# Patient Record
Sex: Male | Born: 1970 | Race: Black or African American | Hispanic: No | Smoking: Former smoker
Health system: Southern US, Community
[De-identification: ages and names within clinical notes are randomized; demographics above are authoritative.]

## PROBLEM LIST (undated history)

## (undated) DIAGNOSIS — M109 Gout, unspecified: Secondary | ICD-10-CM

## (undated) DIAGNOSIS — K219 Gastro-esophageal reflux disease without esophagitis: Secondary | ICD-10-CM

## (undated) DIAGNOSIS — F329 Major depressive disorder, single episode, unspecified: Secondary | ICD-10-CM

## (undated) DIAGNOSIS — I10 Essential (primary) hypertension: Secondary | ICD-10-CM

## (undated) DIAGNOSIS — F32A Depression, unspecified: Secondary | ICD-10-CM

## (undated) DIAGNOSIS — I509 Heart failure, unspecified: Secondary | ICD-10-CM

## (undated) DIAGNOSIS — J302 Other seasonal allergic rhinitis: Secondary | ICD-10-CM

## (undated) DIAGNOSIS — E785 Hyperlipidemia, unspecified: Secondary | ICD-10-CM

## (undated) DIAGNOSIS — E119 Type 2 diabetes mellitus without complications: Secondary | ICD-10-CM

## (undated) DIAGNOSIS — M1990 Unspecified osteoarthritis, unspecified site: Secondary | ICD-10-CM

---
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
## 2010-10-14 DIAGNOSIS — R079 Chest pain, unspecified: Secondary | ICD-10-CM

## 2013-02-08 DIAGNOSIS — R079 Chest pain, unspecified: Secondary | ICD-10-CM

## 2013-02-17 DIAGNOSIS — R079 Chest pain, unspecified: Secondary | ICD-10-CM

## 2015-07-22 DIAGNOSIS — K529 Noninfective gastroenteritis and colitis, unspecified: Secondary | ICD-10-CM | POA: Diagnosis not present

## 2015-07-22 DIAGNOSIS — R197 Diarrhea, unspecified: Secondary | ICD-10-CM | POA: Diagnosis not present

## 2015-07-22 DIAGNOSIS — K219 Gastro-esophageal reflux disease without esophagitis: Secondary | ICD-10-CM | POA: Diagnosis not present

## 2015-07-22 DIAGNOSIS — R509 Fever, unspecified: Secondary | ICD-10-CM | POA: Diagnosis not present

## 2015-07-22 DIAGNOSIS — Z79899 Other long term (current) drug therapy: Secondary | ICD-10-CM | POA: Diagnosis not present

## 2015-07-22 DIAGNOSIS — R0602 Shortness of breath: Secondary | ICD-10-CM | POA: Diagnosis not present

## 2015-07-22 DIAGNOSIS — Z7984 Long term (current) use of oral hypoglycemic drugs: Secondary | ICD-10-CM | POA: Diagnosis not present

## 2015-07-22 DIAGNOSIS — I10 Essential (primary) hypertension: Secondary | ICD-10-CM | POA: Diagnosis not present

## 2015-07-22 DIAGNOSIS — E119 Type 2 diabetes mellitus without complications: Secondary | ICD-10-CM | POA: Diagnosis not present

## 2015-08-24 DIAGNOSIS — K219 Gastro-esophageal reflux disease without esophagitis: Secondary | ICD-10-CM | POA: Diagnosis not present

## 2015-08-24 DIAGNOSIS — I10 Essential (primary) hypertension: Secondary | ICD-10-CM | POA: Diagnosis not present

## 2015-08-24 DIAGNOSIS — W260XXA Contact with knife, initial encounter: Secondary | ICD-10-CM | POA: Diagnosis not present

## 2015-08-24 DIAGNOSIS — Z79899 Other long term (current) drug therapy: Secondary | ICD-10-CM | POA: Diagnosis not present

## 2015-08-24 DIAGNOSIS — E119 Type 2 diabetes mellitus without complications: Secondary | ICD-10-CM | POA: Diagnosis not present

## 2015-08-24 DIAGNOSIS — S6991XA Unspecified injury of right wrist, hand and finger(s), initial encounter: Secondary | ICD-10-CM | POA: Diagnosis not present

## 2015-08-24 DIAGNOSIS — Z7984 Long term (current) use of oral hypoglycemic drugs: Secondary | ICD-10-CM | POA: Diagnosis not present

## 2015-08-24 DIAGNOSIS — Z87891 Personal history of nicotine dependence: Secondary | ICD-10-CM | POA: Diagnosis not present

## 2015-08-24 DIAGNOSIS — S61210A Laceration without foreign body of right index finger without damage to nail, initial encounter: Secondary | ICD-10-CM | POA: Diagnosis not present

## 2015-08-28 DIAGNOSIS — E1142 Type 2 diabetes mellitus with diabetic polyneuropathy: Secondary | ICD-10-CM | POA: Diagnosis not present

## 2015-08-28 DIAGNOSIS — M1049 Other secondary gout, multiple sites: Secondary | ICD-10-CM | POA: Diagnosis not present

## 2015-08-28 DIAGNOSIS — I10 Essential (primary) hypertension: Secondary | ICD-10-CM | POA: Diagnosis not present

## 2015-09-19 DIAGNOSIS — E1142 Type 2 diabetes mellitus with diabetic polyneuropathy: Secondary | ICD-10-CM | POA: Diagnosis not present

## 2015-10-09 DIAGNOSIS — G4733 Obstructive sleep apnea (adult) (pediatric): Secondary | ICD-10-CM | POA: Diagnosis not present

## 2016-02-04 DIAGNOSIS — G4733 Obstructive sleep apnea (adult) (pediatric): Secondary | ICD-10-CM | POA: Diagnosis not present

## 2016-02-04 DIAGNOSIS — Z6841 Body Mass Index (BMI) 40.0 and over, adult: Secondary | ICD-10-CM | POA: Diagnosis not present

## 2016-02-04 DIAGNOSIS — E1142 Type 2 diabetes mellitus with diabetic polyneuropathy: Secondary | ICD-10-CM | POA: Diagnosis not present

## 2016-02-04 DIAGNOSIS — I10 Essential (primary) hypertension: Secondary | ICD-10-CM | POA: Diagnosis not present

## 2016-02-04 DIAGNOSIS — M1049 Other secondary gout, multiple sites: Secondary | ICD-10-CM | POA: Diagnosis not present

## 2016-03-23 DIAGNOSIS — M545 Low back pain: Secondary | ICD-10-CM | POA: Diagnosis not present

## 2016-03-23 DIAGNOSIS — G8929 Other chronic pain: Secondary | ICD-10-CM | POA: Diagnosis not present

## 2016-03-23 DIAGNOSIS — E119 Type 2 diabetes mellitus without complications: Secondary | ICD-10-CM | POA: Diagnosis not present

## 2016-03-23 DIAGNOSIS — M109 Gout, unspecified: Secondary | ICD-10-CM | POA: Diagnosis not present

## 2016-03-23 DIAGNOSIS — G4730 Sleep apnea, unspecified: Secondary | ICD-10-CM | POA: Diagnosis not present

## 2016-03-23 DIAGNOSIS — I509 Heart failure, unspecified: Secondary | ICD-10-CM | POA: Diagnosis not present

## 2016-04-18 DIAGNOSIS — Z0100 Encounter for examination of eyes and vision without abnormal findings: Secondary | ICD-10-CM | POA: Diagnosis not present

## 2016-04-18 DIAGNOSIS — E109 Type 1 diabetes mellitus without complications: Secondary | ICD-10-CM | POA: Diagnosis not present

## 2016-05-09 DIAGNOSIS — E1142 Type 2 diabetes mellitus with diabetic polyneuropathy: Secondary | ICD-10-CM | POA: Diagnosis not present

## 2016-05-09 DIAGNOSIS — I10 Essential (primary) hypertension: Secondary | ICD-10-CM | POA: Diagnosis not present

## 2016-05-09 DIAGNOSIS — G4733 Obstructive sleep apnea (adult) (pediatric): Secondary | ICD-10-CM | POA: Diagnosis not present

## 2016-05-09 DIAGNOSIS — M1049 Other secondary gout, multiple sites: Secondary | ICD-10-CM | POA: Diagnosis not present

## 2016-05-09 DIAGNOSIS — Z6841 Body Mass Index (BMI) 40.0 and over, adult: Secondary | ICD-10-CM | POA: Diagnosis not present

## 2016-06-13 DIAGNOSIS — E785 Hyperlipidemia, unspecified: Secondary | ICD-10-CM | POA: Diagnosis not present

## 2016-06-13 DIAGNOSIS — G4733 Obstructive sleep apnea (adult) (pediatric): Secondary | ICD-10-CM | POA: Diagnosis not present

## 2016-06-13 DIAGNOSIS — R5383 Other fatigue: Secondary | ICD-10-CM | POA: Diagnosis not present

## 2016-06-13 DIAGNOSIS — I10 Essential (primary) hypertension: Secondary | ICD-10-CM | POA: Diagnosis not present

## 2016-06-13 DIAGNOSIS — E1140 Type 2 diabetes mellitus with diabetic neuropathy, unspecified: Secondary | ICD-10-CM | POA: Diagnosis not present

## 2016-06-13 DIAGNOSIS — G4709 Other insomnia: Secondary | ICD-10-CM | POA: Diagnosis not present

## 2016-06-26 DIAGNOSIS — Z888 Allergy status to other drugs, medicaments and biological substances status: Secondary | ICD-10-CM | POA: Diagnosis not present

## 2016-06-26 DIAGNOSIS — Z79899 Other long term (current) drug therapy: Secondary | ICD-10-CM | POA: Diagnosis not present

## 2016-06-26 DIAGNOSIS — Z7722 Contact with and (suspected) exposure to environmental tobacco smoke (acute) (chronic): Secondary | ICD-10-CM | POA: Diagnosis not present

## 2016-06-26 DIAGNOSIS — R0789 Other chest pain: Secondary | ICD-10-CM | POA: Diagnosis not present

## 2016-06-26 DIAGNOSIS — R079 Chest pain, unspecified: Secondary | ICD-10-CM | POA: Diagnosis not present

## 2016-06-26 DIAGNOSIS — R0602 Shortness of breath: Secondary | ICD-10-CM | POA: Diagnosis not present

## 2016-06-26 DIAGNOSIS — Z7984 Long term (current) use of oral hypoglycemic drugs: Secondary | ICD-10-CM | POA: Diagnosis not present

## 2016-06-26 DIAGNOSIS — I10 Essential (primary) hypertension: Secondary | ICD-10-CM | POA: Diagnosis not present

## 2016-06-27 DIAGNOSIS — I10 Essential (primary) hypertension: Secondary | ICD-10-CM | POA: Diagnosis not present

## 2016-06-27 DIAGNOSIS — Z888 Allergy status to other drugs, medicaments and biological substances status: Secondary | ICD-10-CM | POA: Diagnosis not present

## 2016-06-27 DIAGNOSIS — R0602 Shortness of breath: Secondary | ICD-10-CM | POA: Diagnosis not present

## 2016-06-27 DIAGNOSIS — R0789 Other chest pain: Secondary | ICD-10-CM | POA: Diagnosis not present

## 2016-06-27 DIAGNOSIS — Z7722 Contact with and (suspected) exposure to environmental tobacco smoke (acute) (chronic): Secondary | ICD-10-CM | POA: Diagnosis not present

## 2016-06-28 DIAGNOSIS — R0602 Shortness of breath: Secondary | ICD-10-CM | POA: Diagnosis not present

## 2016-06-28 DIAGNOSIS — Z7722 Contact with and (suspected) exposure to environmental tobacco smoke (acute) (chronic): Secondary | ICD-10-CM | POA: Diagnosis not present

## 2016-06-28 DIAGNOSIS — R0789 Other chest pain: Secondary | ICD-10-CM | POA: Diagnosis not present

## 2016-06-28 DIAGNOSIS — I10 Essential (primary) hypertension: Secondary | ICD-10-CM | POA: Diagnosis not present

## 2016-06-28 DIAGNOSIS — Z888 Allergy status to other drugs, medicaments and biological substances status: Secondary | ICD-10-CM | POA: Diagnosis not present

## 2016-06-30 DIAGNOSIS — M1990 Unspecified osteoarthritis, unspecified site: Secondary | ICD-10-CM | POA: Diagnosis not present

## 2016-06-30 DIAGNOSIS — M791 Myalgia: Secondary | ICD-10-CM | POA: Diagnosis not present

## 2016-06-30 DIAGNOSIS — I10 Essential (primary) hypertension: Secondary | ICD-10-CM | POA: Diagnosis not present

## 2016-06-30 DIAGNOSIS — R6883 Chills (without fever): Secondary | ICD-10-CM | POA: Diagnosis not present

## 2016-06-30 DIAGNOSIS — R51 Headache: Secondary | ICD-10-CM | POA: Diagnosis not present

## 2016-06-30 DIAGNOSIS — E119 Type 2 diabetes mellitus without complications: Secondary | ICD-10-CM | POA: Diagnosis not present

## 2016-06-30 DIAGNOSIS — J029 Acute pharyngitis, unspecified: Secondary | ICD-10-CM | POA: Diagnosis not present

## 2016-06-30 DIAGNOSIS — K219 Gastro-esophageal reflux disease without esophagitis: Secondary | ICD-10-CM | POA: Diagnosis not present

## 2016-07-25 DIAGNOSIS — G4733 Obstructive sleep apnea (adult) (pediatric): Secondary | ICD-10-CM | POA: Diagnosis not present

## 2016-10-20 DIAGNOSIS — G4733 Obstructive sleep apnea (adult) (pediatric): Secondary | ICD-10-CM | POA: Diagnosis not present

## 2016-11-20 DIAGNOSIS — G4733 Obstructive sleep apnea (adult) (pediatric): Secondary | ICD-10-CM | POA: Diagnosis not present

## 2016-12-11 DIAGNOSIS — Z6841 Body Mass Index (BMI) 40.0 and over, adult: Secondary | ICD-10-CM | POA: Diagnosis not present

## 2016-12-11 DIAGNOSIS — Z0000 Encounter for general adult medical examination without abnormal findings: Secondary | ICD-10-CM | POA: Diagnosis not present

## 2016-12-11 DIAGNOSIS — E1142 Type 2 diabetes mellitus with diabetic polyneuropathy: Secondary | ICD-10-CM | POA: Diagnosis not present

## 2016-12-11 DIAGNOSIS — I10 Essential (primary) hypertension: Secondary | ICD-10-CM | POA: Diagnosis not present

## 2016-12-11 DIAGNOSIS — M1049 Other secondary gout, multiple sites: Secondary | ICD-10-CM | POA: Diagnosis not present

## 2016-12-11 DIAGNOSIS — G4733 Obstructive sleep apnea (adult) (pediatric): Secondary | ICD-10-CM | POA: Diagnosis not present

## 2016-12-11 DIAGNOSIS — Z1389 Encounter for screening for other disorder: Secondary | ICD-10-CM | POA: Diagnosis not present

## 2016-12-20 DIAGNOSIS — G4733 Obstructive sleep apnea (adult) (pediatric): Secondary | ICD-10-CM | POA: Diagnosis not present

## 2017-01-11 DIAGNOSIS — I110 Hypertensive heart disease with heart failure: Secondary | ICD-10-CM | POA: Diagnosis not present

## 2017-01-11 DIAGNOSIS — Z7982 Long term (current) use of aspirin: Secondary | ICD-10-CM | POA: Diagnosis not present

## 2017-01-11 DIAGNOSIS — Z794 Long term (current) use of insulin: Secondary | ICD-10-CM | POA: Diagnosis not present

## 2017-01-11 DIAGNOSIS — Z87891 Personal history of nicotine dependence: Secondary | ICD-10-CM | POA: Diagnosis not present

## 2017-01-11 DIAGNOSIS — R609 Edema, unspecified: Secondary | ICD-10-CM | POA: Diagnosis not present

## 2017-01-11 DIAGNOSIS — Z79899 Other long term (current) drug therapy: Secondary | ICD-10-CM | POA: Diagnosis not present

## 2017-01-11 DIAGNOSIS — R600 Localized edema: Secondary | ICD-10-CM | POA: Diagnosis not present

## 2017-01-11 DIAGNOSIS — I509 Heart failure, unspecified: Secondary | ICD-10-CM | POA: Diagnosis not present

## 2017-01-11 DIAGNOSIS — E119 Type 2 diabetes mellitus without complications: Secondary | ICD-10-CM | POA: Diagnosis not present

## 2017-01-20 DIAGNOSIS — G4733 Obstructive sleep apnea (adult) (pediatric): Secondary | ICD-10-CM | POA: Diagnosis not present

## 2017-08-02 DIAGNOSIS — Z87891 Personal history of nicotine dependence: Secondary | ICD-10-CM | POA: Diagnosis not present

## 2017-08-02 DIAGNOSIS — I110 Hypertensive heart disease with heart failure: Secondary | ICD-10-CM | POA: Diagnosis not present

## 2017-08-02 DIAGNOSIS — M109 Gout, unspecified: Secondary | ICD-10-CM | POA: Diagnosis not present

## 2017-08-02 DIAGNOSIS — I509 Heart failure, unspecified: Secondary | ICD-10-CM | POA: Diagnosis not present

## 2017-08-02 DIAGNOSIS — Z7984 Long term (current) use of oral hypoglycemic drugs: Secondary | ICD-10-CM | POA: Diagnosis not present

## 2017-08-02 DIAGNOSIS — R1030 Lower abdominal pain, unspecified: Secondary | ICD-10-CM | POA: Diagnosis not present

## 2017-08-02 DIAGNOSIS — Z7982 Long term (current) use of aspirin: Secondary | ICD-10-CM | POA: Diagnosis not present

## 2017-08-02 DIAGNOSIS — K219 Gastro-esophageal reflux disease without esophagitis: Secondary | ICD-10-CM | POA: Diagnosis not present

## 2017-08-02 DIAGNOSIS — Z79899 Other long term (current) drug therapy: Secondary | ICD-10-CM | POA: Diagnosis not present

## 2017-08-02 DIAGNOSIS — E119 Type 2 diabetes mellitus without complications: Secondary | ICD-10-CM | POA: Diagnosis not present

## 2017-09-24 DIAGNOSIS — Z8249 Family history of ischemic heart disease and other diseases of the circulatory system: Secondary | ICD-10-CM | POA: Diagnosis not present

## 2017-09-24 DIAGNOSIS — J302 Other seasonal allergic rhinitis: Secondary | ICD-10-CM | POA: Diagnosis not present

## 2017-09-24 DIAGNOSIS — E119 Type 2 diabetes mellitus without complications: Secondary | ICD-10-CM | POA: Diagnosis not present

## 2017-09-24 DIAGNOSIS — Z87891 Personal history of nicotine dependence: Secondary | ICD-10-CM | POA: Diagnosis not present

## 2017-09-24 DIAGNOSIS — Z79899 Other long term (current) drug therapy: Secondary | ICD-10-CM | POA: Diagnosis not present

## 2017-09-24 DIAGNOSIS — M1990 Unspecified osteoarthritis, unspecified site: Secondary | ICD-10-CM | POA: Diagnosis not present

## 2017-09-24 DIAGNOSIS — I509 Heart failure, unspecified: Secondary | ICD-10-CM | POA: Diagnosis not present

## 2017-09-24 DIAGNOSIS — I110 Hypertensive heart disease with heart failure: Secondary | ICD-10-CM | POA: Diagnosis not present

## 2017-09-24 DIAGNOSIS — Z7984 Long term (current) use of oral hypoglycemic drugs: Secondary | ICD-10-CM | POA: Diagnosis not present

## 2017-09-24 DIAGNOSIS — K219 Gastro-esophageal reflux disease without esophagitis: Secondary | ICD-10-CM | POA: Diagnosis not present

## 2017-09-24 DIAGNOSIS — I10 Essential (primary) hypertension: Secondary | ICD-10-CM | POA: Diagnosis not present

## 2017-09-24 DIAGNOSIS — Z7982 Long term (current) use of aspirin: Secondary | ICD-10-CM | POA: Diagnosis not present

## 2017-10-19 DIAGNOSIS — E1142 Type 2 diabetes mellitus with diabetic polyneuropathy: Secondary | ICD-10-CM | POA: Diagnosis not present

## 2017-10-19 DIAGNOSIS — M1049 Other secondary gout, multiple sites: Secondary | ICD-10-CM | POA: Diagnosis not present

## 2017-10-19 DIAGNOSIS — G4733 Obstructive sleep apnea (adult) (pediatric): Secondary | ICD-10-CM | POA: Diagnosis not present

## 2017-10-19 DIAGNOSIS — I10 Essential (primary) hypertension: Secondary | ICD-10-CM | POA: Diagnosis not present

## 2017-10-20 DIAGNOSIS — E119 Type 2 diabetes mellitus without complications: Secondary | ICD-10-CM | POA: Diagnosis not present

## 2017-10-20 DIAGNOSIS — R0602 Shortness of breath: Secondary | ICD-10-CM | POA: Diagnosis not present

## 2018-01-19 DIAGNOSIS — I10 Essential (primary) hypertension: Secondary | ICD-10-CM | POA: Diagnosis not present

## 2018-01-19 DIAGNOSIS — Z0000 Encounter for general adult medical examination without abnormal findings: Secondary | ICD-10-CM | POA: Diagnosis not present

## 2018-01-19 DIAGNOSIS — E1142 Type 2 diabetes mellitus with diabetic polyneuropathy: Secondary | ICD-10-CM | POA: Diagnosis not present

## 2018-01-19 DIAGNOSIS — G4733 Obstructive sleep apnea (adult) (pediatric): Secondary | ICD-10-CM | POA: Diagnosis not present

## 2018-01-19 DIAGNOSIS — M1049 Other secondary gout, multiple sites: Secondary | ICD-10-CM | POA: Diagnosis not present

## 2018-03-28 DIAGNOSIS — K219 Gastro-esophageal reflux disease without esophagitis: Secondary | ICD-10-CM | POA: Diagnosis not present

## 2018-03-28 DIAGNOSIS — Z7984 Long term (current) use of oral hypoglycemic drugs: Secondary | ICD-10-CM | POA: Diagnosis not present

## 2018-03-28 DIAGNOSIS — Z8249 Family history of ischemic heart disease and other diseases of the circulatory system: Secondary | ICD-10-CM | POA: Diagnosis not present

## 2018-03-28 DIAGNOSIS — Z833 Family history of diabetes mellitus: Secondary | ICD-10-CM | POA: Diagnosis not present

## 2018-03-28 DIAGNOSIS — I509 Heart failure, unspecified: Secondary | ICD-10-CM | POA: Diagnosis not present

## 2018-03-28 DIAGNOSIS — M1712 Unilateral primary osteoarthritis, left knee: Secondary | ICD-10-CM | POA: Diagnosis not present

## 2018-03-28 DIAGNOSIS — Z8639 Personal history of other endocrine, nutritional and metabolic disease: Secondary | ICD-10-CM | POA: Diagnosis not present

## 2018-03-28 DIAGNOSIS — I110 Hypertensive heart disease with heart failure: Secondary | ICD-10-CM | POA: Diagnosis not present

## 2018-03-28 DIAGNOSIS — M25562 Pain in left knee: Secondary | ICD-10-CM | POA: Diagnosis not present

## 2018-03-28 DIAGNOSIS — I10 Essential (primary) hypertension: Secondary | ICD-10-CM | POA: Diagnosis not present

## 2018-03-28 DIAGNOSIS — E119 Type 2 diabetes mellitus without complications: Secondary | ICD-10-CM | POA: Diagnosis not present

## 2018-03-28 DIAGNOSIS — Z87891 Personal history of nicotine dependence: Secondary | ICD-10-CM | POA: Diagnosis not present

## 2018-03-28 DIAGNOSIS — S8992XA Unspecified injury of left lower leg, initial encounter: Secondary | ICD-10-CM | POA: Diagnosis not present

## 2018-03-28 DIAGNOSIS — Z79899 Other long term (current) drug therapy: Secondary | ICD-10-CM | POA: Diagnosis not present

## 2018-03-28 DIAGNOSIS — Z8614 Personal history of Methicillin resistant Staphylococcus aureus infection: Secondary | ICD-10-CM | POA: Diagnosis not present

## 2018-04-20 DIAGNOSIS — Z9114 Patient's other noncompliance with medication regimen: Secondary | ICD-10-CM | POA: Diagnosis not present

## 2018-04-20 DIAGNOSIS — Z7951 Long term (current) use of inhaled steroids: Secondary | ICD-10-CM | POA: Diagnosis not present

## 2018-04-20 DIAGNOSIS — I110 Hypertensive heart disease with heart failure: Secondary | ICD-10-CM | POA: Diagnosis not present

## 2018-04-20 DIAGNOSIS — E785 Hyperlipidemia, unspecified: Secondary | ICD-10-CM | POA: Diagnosis not present

## 2018-04-20 DIAGNOSIS — Z833 Family history of diabetes mellitus: Secondary | ICD-10-CM | POA: Diagnosis not present

## 2018-04-20 DIAGNOSIS — K219 Gastro-esophageal reflux disease without esophagitis: Secondary | ICD-10-CM | POA: Diagnosis not present

## 2018-04-20 DIAGNOSIS — E119 Type 2 diabetes mellitus without complications: Secondary | ICD-10-CM | POA: Diagnosis not present

## 2018-04-20 DIAGNOSIS — M1990 Unspecified osteoarthritis, unspecified site: Secondary | ICD-10-CM | POA: Diagnosis not present

## 2018-04-20 DIAGNOSIS — G4489 Other headache syndrome: Secondary | ICD-10-CM | POA: Diagnosis not present

## 2018-04-20 DIAGNOSIS — Z79899 Other long term (current) drug therapy: Secondary | ICD-10-CM | POA: Diagnosis not present

## 2018-04-20 DIAGNOSIS — Z7984 Long term (current) use of oral hypoglycemic drugs: Secondary | ICD-10-CM | POA: Diagnosis not present

## 2018-04-20 DIAGNOSIS — I509 Heart failure, unspecified: Secondary | ICD-10-CM | POA: Diagnosis not present

## 2018-04-20 DIAGNOSIS — Z888 Allergy status to other drugs, medicaments and biological substances status: Secondary | ICD-10-CM | POA: Diagnosis not present

## 2018-04-20 DIAGNOSIS — R42 Dizziness and giddiness: Secondary | ICD-10-CM | POA: Diagnosis not present

## 2018-04-20 DIAGNOSIS — R55 Syncope and collapse: Secondary | ICD-10-CM | POA: Diagnosis not present

## 2018-04-20 DIAGNOSIS — R0902 Hypoxemia: Secondary | ICD-10-CM | POA: Diagnosis not present

## 2018-04-20 DIAGNOSIS — M109 Gout, unspecified: Secondary | ICD-10-CM | POA: Diagnosis not present

## 2018-04-20 DIAGNOSIS — R531 Weakness: Secondary | ICD-10-CM | POA: Diagnosis not present

## 2018-04-20 DIAGNOSIS — G4730 Sleep apnea, unspecified: Secondary | ICD-10-CM | POA: Diagnosis not present

## 2018-04-20 DIAGNOSIS — Z823 Family history of stroke: Secondary | ICD-10-CM | POA: Diagnosis not present

## 2018-04-20 DIAGNOSIS — Z8249 Family history of ischemic heart disease and other diseases of the circulatory system: Secondary | ICD-10-CM | POA: Diagnosis not present

## 2018-04-20 DIAGNOSIS — I10 Essential (primary) hypertension: Secondary | ICD-10-CM | POA: Diagnosis not present

## 2018-04-21 DIAGNOSIS — M109 Gout, unspecified: Secondary | ICD-10-CM | POA: Diagnosis not present

## 2018-04-21 DIAGNOSIS — I110 Hypertensive heart disease with heart failure: Secondary | ICD-10-CM | POA: Diagnosis not present

## 2018-04-21 DIAGNOSIS — R55 Syncope and collapse: Secondary | ICD-10-CM | POA: Diagnosis not present

## 2018-04-21 DIAGNOSIS — R079 Chest pain, unspecified: Secondary | ICD-10-CM | POA: Diagnosis not present

## 2018-04-21 DIAGNOSIS — I509 Heart failure, unspecified: Secondary | ICD-10-CM | POA: Diagnosis not present

## 2018-04-21 DIAGNOSIS — E119 Type 2 diabetes mellitus without complications: Secondary | ICD-10-CM | POA: Diagnosis not present

## 2018-04-28 DIAGNOSIS — G4733 Obstructive sleep apnea (adult) (pediatric): Secondary | ICD-10-CM | POA: Diagnosis not present

## 2018-04-28 DIAGNOSIS — R55 Syncope and collapse: Secondary | ICD-10-CM | POA: Diagnosis not present

## 2018-04-28 DIAGNOSIS — I10 Essential (primary) hypertension: Secondary | ICD-10-CM | POA: Diagnosis not present

## 2018-05-05 DIAGNOSIS — R079 Chest pain, unspecified: Secondary | ICD-10-CM | POA: Diagnosis not present

## 2018-05-13 DIAGNOSIS — I509 Heart failure, unspecified: Secondary | ICD-10-CM | POA: Diagnosis not present

## 2018-05-13 DIAGNOSIS — E119 Type 2 diabetes mellitus without complications: Secondary | ICD-10-CM | POA: Diagnosis not present

## 2018-05-13 DIAGNOSIS — M109 Gout, unspecified: Secondary | ICD-10-CM | POA: Diagnosis not present

## 2018-05-13 DIAGNOSIS — J069 Acute upper respiratory infection, unspecified: Secondary | ICD-10-CM | POA: Diagnosis not present

## 2018-05-13 DIAGNOSIS — Z8614 Personal history of Methicillin resistant Staphylococcus aureus infection: Secondary | ICD-10-CM | POA: Diagnosis not present

## 2018-05-13 DIAGNOSIS — Z87891 Personal history of nicotine dependence: Secondary | ICD-10-CM | POA: Diagnosis not present

## 2018-05-13 DIAGNOSIS — K219 Gastro-esophageal reflux disease without esophagitis: Secondary | ICD-10-CM | POA: Diagnosis not present

## 2018-05-13 DIAGNOSIS — Z79899 Other long term (current) drug therapy: Secondary | ICD-10-CM | POA: Diagnosis not present

## 2018-05-13 DIAGNOSIS — Z7984 Long term (current) use of oral hypoglycemic drugs: Secondary | ICD-10-CM | POA: Diagnosis not present

## 2018-05-13 DIAGNOSIS — J209 Acute bronchitis, unspecified: Secondary | ICD-10-CM | POA: Diagnosis not present

## 2018-05-13 DIAGNOSIS — I110 Hypertensive heart disease with heart failure: Secondary | ICD-10-CM | POA: Diagnosis not present

## 2018-05-13 DIAGNOSIS — Z888 Allergy status to other drugs, medicaments and biological substances status: Secondary | ICD-10-CM | POA: Diagnosis not present

## 2018-05-13 DIAGNOSIS — R0989 Other specified symptoms and signs involving the circulatory and respiratory systems: Secondary | ICD-10-CM | POA: Diagnosis not present

## 2018-05-20 DIAGNOSIS — R079 Chest pain, unspecified: Secondary | ICD-10-CM | POA: Diagnosis not present

## 2018-05-21 ENCOUNTER — Other Ambulatory Visit: Payer: Self-pay

## 2018-05-21 DIAGNOSIS — R079 Chest pain, unspecified: Secondary | ICD-10-CM | POA: Diagnosis not present

## 2018-05-21 DIAGNOSIS — I480 Paroxysmal atrial fibrillation: Secondary | ICD-10-CM | POA: Diagnosis not present

## 2018-05-21 NOTE — Patient Outreach (Signed)
[facility] Care Management  05/21/2018  Douglas [name] 1971/04/21 [id number]   Medication Adherence call to [name] left a message for patient to call back patient is due on Atovastatin 10 mg. [name] is showing past due under [employer].   Lillia AbedAna Ollison-Moran CPhT Pharmacy Technician [facility] Care Management Direct Dial [phone]  Fax [phone] [email or web address]

## 2018-06-19 DIAGNOSIS — I480 Paroxysmal atrial fibrillation: Secondary | ICD-10-CM | POA: Diagnosis not present

## 2018-06-23 DIAGNOSIS — I480 Paroxysmal atrial fibrillation: Secondary | ICD-10-CM | POA: Diagnosis not present

## 2018-07-19 DIAGNOSIS — I517 Cardiomegaly: Secondary | ICD-10-CM | POA: Diagnosis not present

## 2018-07-19 DIAGNOSIS — I5030 Unspecified diastolic (congestive) heart failure: Secondary | ICD-10-CM | POA: Diagnosis not present

## 2018-07-19 DIAGNOSIS — I480 Paroxysmal atrial fibrillation: Secondary | ICD-10-CM | POA: Diagnosis not present

## 2018-08-03 DIAGNOSIS — Z0000 Encounter for general adult medical examination without abnormal findings: Secondary | ICD-10-CM | POA: Diagnosis not present

## 2018-08-03 DIAGNOSIS — Z1389 Encounter for screening for other disorder: Secondary | ICD-10-CM | POA: Diagnosis not present

## 2018-08-03 DIAGNOSIS — E1143 Type 2 diabetes mellitus with diabetic autonomic (poly)neuropathy: Secondary | ICD-10-CM | POA: Diagnosis not present

## 2018-08-03 DIAGNOSIS — G4733 Obstructive sleep apnea (adult) (pediatric): Secondary | ICD-10-CM | POA: Diagnosis not present

## 2018-08-03 DIAGNOSIS — I10 Essential (primary) hypertension: Secondary | ICD-10-CM | POA: Diagnosis not present

## 2018-08-19 DIAGNOSIS — E882 Lipomatosis, not elsewhere classified: Secondary | ICD-10-CM | POA: Diagnosis not present

## 2018-08-23 DIAGNOSIS — I480 Paroxysmal atrial fibrillation: Secondary | ICD-10-CM | POA: Diagnosis not present

## 2018-08-26 DIAGNOSIS — I509 Heart failure, unspecified: Secondary | ICD-10-CM | POA: Diagnosis not present

## 2018-08-26 DIAGNOSIS — K219 Gastro-esophageal reflux disease without esophagitis: Secondary | ICD-10-CM | POA: Diagnosis not present

## 2018-08-26 DIAGNOSIS — Z87891 Personal history of nicotine dependence: Secondary | ICD-10-CM | POA: Diagnosis not present

## 2018-08-26 DIAGNOSIS — Z8614 Personal history of Methicillin resistant Staphylococcus aureus infection: Secondary | ICD-10-CM | POA: Diagnosis not present

## 2018-08-26 DIAGNOSIS — M109 Gout, unspecified: Secondary | ICD-10-CM | POA: Diagnosis not present

## 2018-08-26 DIAGNOSIS — T8140XA Infection following a procedure, unspecified, initial encounter: Secondary | ICD-10-CM | POA: Diagnosis not present

## 2018-08-26 DIAGNOSIS — Z79899 Other long term (current) drug therapy: Secondary | ICD-10-CM | POA: Diagnosis not present

## 2018-08-26 DIAGNOSIS — E119 Type 2 diabetes mellitus without complications: Secondary | ICD-10-CM | POA: Diagnosis not present

## 2018-08-26 DIAGNOSIS — L7682 Other postprocedural complications of skin and subcutaneous tissue: Secondary | ICD-10-CM | POA: Diagnosis not present

## 2018-08-26 DIAGNOSIS — L03113 Cellulitis of right upper limb: Secondary | ICD-10-CM | POA: Diagnosis not present

## 2018-08-26 DIAGNOSIS — I110 Hypertensive heart disease with heart failure: Secondary | ICD-10-CM | POA: Diagnosis not present

## 2018-08-26 DIAGNOSIS — Z888 Allergy status to other drugs, medicaments and biological substances status: Secondary | ICD-10-CM | POA: Diagnosis not present

## 2018-08-26 DIAGNOSIS — Z7984 Long term (current) use of oral hypoglycemic drugs: Secondary | ICD-10-CM | POA: Diagnosis not present

## 2018-11-05 ENCOUNTER — Other Ambulatory Visit: Payer: Self-pay

## 2018-11-05 NOTE — Patient Outreach (Signed)
[facility]) Care Management  11/05/2018  Tayvien [facility] 12-01-1970 [id number]   Medication Adherence call to Mr. Arlean Hopping HIPPA Compliant Voice message left with a call back number. Mr. Dovidio is showing past due on Losartan 50 mg and Metformin 500 mg under [employer].   Lillia Abed CPhT Pharmacy Technician [facility] Management Direct Dial [phone]  Fax [phone] [email or web address]

## 2018-11-17 ENCOUNTER — Other Ambulatory Visit: Payer: Self-pay

## 2018-11-17 NOTE — Patient Outreach (Signed)
[facility]) Care Management  11/17/2018  Htoo Kaniecki [facility] 1971/01/31 [id number]   Medication Adherence call to Mr. Douglas [name] HIPPA Compliant Voice message left with a call back number. [name] is showing past due on Losartan 50 mg and Metformin 1000 mg under [employer].   Lillia Abed CPhT Pharmacy Technician [facility] Care Management Direct Dial [phone]  Fax [phone] [email or web address]

## 2018-12-06 DIAGNOSIS — Z0000 Encounter for general adult medical examination without abnormal findings: Secondary | ICD-10-CM | POA: Diagnosis not present

## 2018-12-06 DIAGNOSIS — I10 Essential (primary) hypertension: Secondary | ICD-10-CM | POA: Diagnosis not present

## 2018-12-06 DIAGNOSIS — M545 Low back pain: Secondary | ICD-10-CM | POA: Diagnosis not present

## 2018-12-06 DIAGNOSIS — E119 Type 2 diabetes mellitus without complications: Secondary | ICD-10-CM | POA: Diagnosis not present

## 2018-12-06 DIAGNOSIS — M109 Gout, unspecified: Secondary | ICD-10-CM | POA: Diagnosis not present

## 2019-01-05 ENCOUNTER — Other Ambulatory Visit: Payer: Self-pay

## 2019-01-05 NOTE — Patient Outreach (Signed)
[facility] [facility]) Care Management  01/05/2019  Douglas [name] [facility] 05/29/1971 [id number]   Medication Adherence call to Douglas [name] Hipp[employer] Identifiers Verify spoke with patient he is past due on Atorvastatin 10 mg patient explain he is taking 1 tablet daily and has received all his medication 2 weeks ago and has enough for 90 days. Douglas [name] is showing past due under [place].   [place] Management Direct Dial [phone]  Fax [phone] [email or web address]

## 2019-01-07 DIAGNOSIS — R079 Chest pain, unspecified: Secondary | ICD-10-CM | POA: Diagnosis not present

## 2019-01-07 DIAGNOSIS — Z87891 Personal history of nicotine dependence: Secondary | ICD-10-CM | POA: Diagnosis not present

## 2019-01-07 DIAGNOSIS — I517 Cardiomegaly: Secondary | ICD-10-CM | POA: Diagnosis not present

## 2019-01-07 DIAGNOSIS — I491 Atrial premature depolarization: Secondary | ICD-10-CM | POA: Diagnosis not present

## 2019-01-07 DIAGNOSIS — R0789 Other chest pain: Secondary | ICD-10-CM | POA: Diagnosis not present

## 2019-01-07 DIAGNOSIS — I509 Heart failure, unspecified: Secondary | ICD-10-CM | POA: Diagnosis not present

## 2019-01-07 DIAGNOSIS — I10 Essential (primary) hypertension: Secondary | ICD-10-CM | POA: Diagnosis not present

## 2019-01-07 DIAGNOSIS — Z79899 Other long term (current) drug therapy: Secondary | ICD-10-CM | POA: Diagnosis not present

## 2019-01-07 DIAGNOSIS — R0989 Other specified symptoms and signs involving the circulatory and respiratory systems: Secondary | ICD-10-CM | POA: Diagnosis not present

## 2019-01-07 DIAGNOSIS — R0902 Hypoxemia: Secondary | ICD-10-CM | POA: Diagnosis not present

## 2019-01-07 DIAGNOSIS — J80 Acute respiratory distress syndrome: Secondary | ICD-10-CM | POA: Diagnosis not present

## 2019-01-07 DIAGNOSIS — E119 Type 2 diabetes mellitus without complications: Secondary | ICD-10-CM | POA: Diagnosis not present

## 2019-01-10 DIAGNOSIS — Z8249 Family history of ischemic heart disease and other diseases of the circulatory system: Secondary | ICD-10-CM | POA: Diagnosis not present

## 2019-01-10 DIAGNOSIS — Z87891 Personal history of nicotine dependence: Secondary | ICD-10-CM | POA: Diagnosis not present

## 2019-01-10 DIAGNOSIS — Z8639 Personal history of other endocrine, nutritional and metabolic disease: Secondary | ICD-10-CM | POA: Diagnosis not present

## 2019-01-10 DIAGNOSIS — R252 Cramp and spasm: Secondary | ICD-10-CM | POA: Diagnosis not present

## 2019-01-10 DIAGNOSIS — Z8614 Personal history of Methicillin resistant Staphylococcus aureus infection: Secondary | ICD-10-CM | POA: Diagnosis not present

## 2019-01-10 DIAGNOSIS — Z888 Allergy status to other drugs, medicaments and biological substances status: Secondary | ICD-10-CM | POA: Diagnosis not present

## 2019-01-10 DIAGNOSIS — I509 Heart failure, unspecified: Secondary | ICD-10-CM | POA: Diagnosis not present

## 2019-01-10 DIAGNOSIS — E119 Type 2 diabetes mellitus without complications: Secondary | ICD-10-CM | POA: Diagnosis not present

## 2019-01-10 DIAGNOSIS — Z7984 Long term (current) use of oral hypoglycemic drugs: Secondary | ICD-10-CM | POA: Diagnosis not present

## 2019-01-10 DIAGNOSIS — I10 Essential (primary) hypertension: Secondary | ICD-10-CM | POA: Diagnosis not present

## 2019-01-10 DIAGNOSIS — Z79899 Other long term (current) drug therapy: Secondary | ICD-10-CM | POA: Diagnosis not present

## 2019-01-10 DIAGNOSIS — R0989 Other specified symptoms and signs involving the circulatory and respiratory systems: Secondary | ICD-10-CM | POA: Diagnosis not present

## 2019-01-10 DIAGNOSIS — I110 Hypertensive heart disease with heart failure: Secondary | ICD-10-CM | POA: Diagnosis not present

## 2019-01-10 DIAGNOSIS — Z833 Family history of diabetes mellitus: Secondary | ICD-10-CM | POA: Diagnosis not present

## 2019-01-11 DIAGNOSIS — I10 Essential (primary) hypertension: Secondary | ICD-10-CM | POA: Diagnosis not present

## 2019-01-26 DIAGNOSIS — Z79899 Other long term (current) drug therapy: Secondary | ICD-10-CM | POA: Diagnosis not present

## 2019-01-26 DIAGNOSIS — I10 Essential (primary) hypertension: Secondary | ICD-10-CM | POA: Diagnosis not present

## 2019-01-26 DIAGNOSIS — I471 Supraventricular tachycardia: Secondary | ICD-10-CM | POA: Diagnosis not present

## 2019-01-26 DIAGNOSIS — R079 Chest pain, unspecified: Secondary | ICD-10-CM | POA: Diagnosis not present

## 2019-01-26 DIAGNOSIS — Z823 Family history of stroke: Secondary | ICD-10-CM | POA: Diagnosis not present

## 2019-01-26 DIAGNOSIS — Z9114 Patient's other noncompliance with medication regimen: Secondary | ICD-10-CM | POA: Diagnosis not present

## 2019-01-26 DIAGNOSIS — E119 Type 2 diabetes mellitus without complications: Secondary | ICD-10-CM | POA: Diagnosis not present

## 2019-01-26 DIAGNOSIS — I480 Paroxysmal atrial fibrillation: Secondary | ICD-10-CM | POA: Diagnosis not present

## 2019-01-26 DIAGNOSIS — I5030 Unspecified diastolic (congestive) heart failure: Secondary | ICD-10-CM | POA: Diagnosis not present

## 2019-01-26 DIAGNOSIS — R000 Tachycardia, unspecified: Secondary | ICD-10-CM | POA: Diagnosis not present

## 2019-01-26 DIAGNOSIS — Z8249 Family history of ischemic heart disease and other diseases of the circulatory system: Secondary | ICD-10-CM | POA: Diagnosis not present

## 2019-01-26 DIAGNOSIS — R202 Paresthesia of skin: Secondary | ICD-10-CM | POA: Diagnosis not present

## 2019-01-26 DIAGNOSIS — Z7984 Long term (current) use of oral hypoglycemic drugs: Secondary | ICD-10-CM | POA: Diagnosis not present

## 2019-01-26 DIAGNOSIS — I110 Hypertensive heart disease with heart failure: Secondary | ICD-10-CM | POA: Diagnosis not present

## 2019-01-26 DIAGNOSIS — I209 Angina pectoris, unspecified: Secondary | ICD-10-CM | POA: Diagnosis not present

## 2019-01-26 DIAGNOSIS — M109 Gout, unspecified: Secondary | ICD-10-CM | POA: Diagnosis not present

## 2019-01-26 DIAGNOSIS — Z8614 Personal history of Methicillin resistant Staphylococcus aureus infection: Secondary | ICD-10-CM | POA: Diagnosis not present

## 2019-01-26 DIAGNOSIS — G4730 Sleep apnea, unspecified: Secondary | ICD-10-CM | POA: Diagnosis not present

## 2019-01-26 DIAGNOSIS — I5020 Unspecified systolic (congestive) heart failure: Secondary | ICD-10-CM | POA: Diagnosis not present

## 2019-01-26 DIAGNOSIS — Z7901 Long term (current) use of anticoagulants: Secondary | ICD-10-CM | POA: Diagnosis not present

## 2019-01-26 DIAGNOSIS — R0789 Other chest pain: Secondary | ICD-10-CM | POA: Diagnosis not present

## 2019-01-26 DIAGNOSIS — R0689 Other abnormalities of breathing: Secondary | ICD-10-CM | POA: Diagnosis not present

## 2019-01-26 DIAGNOSIS — R9431 Abnormal electrocardiogram [ECG] [EKG]: Secondary | ICD-10-CM | POA: Diagnosis not present

## 2019-01-26 DIAGNOSIS — R748 Abnormal levels of other serum enzymes: Secondary | ICD-10-CM | POA: Diagnosis not present

## 2019-01-26 DIAGNOSIS — K219 Gastro-esophageal reflux disease without esophagitis: Secondary | ICD-10-CM | POA: Diagnosis not present

## 2019-01-27 DIAGNOSIS — I5030 Unspecified diastolic (congestive) heart failure: Secondary | ICD-10-CM | POA: Diagnosis not present

## 2019-01-27 DIAGNOSIS — I110 Hypertensive heart disease with heart failure: Secondary | ICD-10-CM | POA: Diagnosis not present

## 2019-01-27 DIAGNOSIS — G4730 Sleep apnea, unspecified: Secondary | ICD-10-CM | POA: Diagnosis not present

## 2019-01-27 DIAGNOSIS — I480 Paroxysmal atrial fibrillation: Secondary | ICD-10-CM | POA: Diagnosis not present

## 2019-01-27 DIAGNOSIS — I209 Angina pectoris, unspecified: Secondary | ICD-10-CM | POA: Diagnosis not present

## 2019-01-28 DIAGNOSIS — R0602 Shortness of breath: Secondary | ICD-10-CM | POA: Diagnosis not present

## 2019-01-28 DIAGNOSIS — I10 Essential (primary) hypertension: Secondary | ICD-10-CM | POA: Diagnosis not present

## 2019-02-01 DIAGNOSIS — E860 Dehydration: Secondary | ICD-10-CM | POA: Diagnosis not present

## 2019-02-01 DIAGNOSIS — Z7984 Long term (current) use of oral hypoglycemic drugs: Secondary | ICD-10-CM | POA: Diagnosis not present

## 2019-02-01 DIAGNOSIS — G4730 Sleep apnea, unspecified: Secondary | ICD-10-CM | POA: Diagnosis not present

## 2019-02-01 DIAGNOSIS — Z79899 Other long term (current) drug therapy: Secondary | ICD-10-CM | POA: Diagnosis not present

## 2019-02-01 DIAGNOSIS — Z7901 Long term (current) use of anticoagulants: Secondary | ICD-10-CM | POA: Diagnosis not present

## 2019-02-01 DIAGNOSIS — I4891 Unspecified atrial fibrillation: Secondary | ICD-10-CM | POA: Diagnosis not present

## 2019-02-01 DIAGNOSIS — I509 Heart failure, unspecified: Secondary | ICD-10-CM | POA: Diagnosis not present

## 2019-02-01 DIAGNOSIS — I110 Hypertensive heart disease with heart failure: Secondary | ICD-10-CM | POA: Diagnosis not present

## 2019-02-01 DIAGNOSIS — E119 Type 2 diabetes mellitus without complications: Secondary | ICD-10-CM | POA: Diagnosis not present

## 2019-02-01 DIAGNOSIS — M109 Gout, unspecified: Secondary | ICD-10-CM | POA: Diagnosis not present

## 2019-02-01 DIAGNOSIS — Z87891 Personal history of nicotine dependence: Secondary | ICD-10-CM | POA: Diagnosis not present

## 2019-02-03 DIAGNOSIS — I480 Paroxysmal atrial fibrillation: Secondary | ICD-10-CM | POA: Diagnosis not present

## 2019-02-03 DIAGNOSIS — G4733 Obstructive sleep apnea (adult) (pediatric): Secondary | ICD-10-CM | POA: Diagnosis not present

## 2019-02-03 DIAGNOSIS — I5022 Chronic systolic (congestive) heart failure: Secondary | ICD-10-CM | POA: Diagnosis not present

## 2019-02-03 DIAGNOSIS — I10 Essential (primary) hypertension: Secondary | ICD-10-CM | POA: Diagnosis not present

## 2019-02-03 DIAGNOSIS — E119 Type 2 diabetes mellitus without complications: Secondary | ICD-10-CM | POA: Diagnosis not present

## 2019-03-10 ENCOUNTER — Other Ambulatory Visit: Payer: Self-pay

## 2019-03-10 NOTE — Patient Outreach (Signed)
[place] [facility]) Care Management  03/10/2019  Douglas [name] [facility] [birth year] [id number]   Medication Adherence call to Douglas [name] Voice message left with a call back number. Douglas [name] is showing past due on Metformin 1000 mg under [name].   [place] Management Direct Dial [phone]  Fax [phone] [name]

## 2019-03-21 DIAGNOSIS — R079 Chest pain, unspecified: Secondary | ICD-10-CM | POA: Diagnosis not present

## 2019-03-21 DIAGNOSIS — E119 Type 2 diabetes mellitus without complications: Secondary | ICD-10-CM | POA: Diagnosis not present

## 2019-03-21 DIAGNOSIS — I10 Essential (primary) hypertension: Secondary | ICD-10-CM | POA: Diagnosis not present

## 2019-03-21 DIAGNOSIS — I5022 Chronic systolic (congestive) heart failure: Secondary | ICD-10-CM | POA: Diagnosis not present

## 2019-03-24 ENCOUNTER — Other Ambulatory Visit: Payer: Self-pay

## 2019-03-24 NOTE — Patient Outreach (Signed)
[facility]) Care Management  03/24/2019  Gianlucca Szymborski [facility] 18-Aug-1970 [id number]   Medication Adherence call to Mr. Dexter Voice message left with a call back number.Mr. Doro is showing past due on Metformin 1000 mg mg under [place].   [place] Management Direct Dial [phone]  Fax [phone] [email or web address]

## 2019-03-29 DIAGNOSIS — R911 Solitary pulmonary nodule: Secondary | ICD-10-CM | POA: Diagnosis not present

## 2019-03-29 DIAGNOSIS — N62 Hypertrophy of breast: Secondary | ICD-10-CM | POA: Diagnosis not present

## 2019-03-29 DIAGNOSIS — R918 Other nonspecific abnormal finding of lung field: Secondary | ICD-10-CM | POA: Diagnosis not present

## 2019-04-15 ENCOUNTER — Other Ambulatory Visit: Payer: Self-pay

## 2019-04-15 DIAGNOSIS — Z20822 Contact with and (suspected) exposure to covid-19: Secondary | ICD-10-CM

## 2019-04-16 LAB — NOVEL CORONAVIRUS, NAA: SARS-CoV-2, NAA: NOT DETECTED

## 2019-04-18 DIAGNOSIS — R911 Solitary pulmonary nodule: Secondary | ICD-10-CM | POA: Diagnosis not present

## 2019-04-18 DIAGNOSIS — E119 Type 2 diabetes mellitus without complications: Secondary | ICD-10-CM | POA: Diagnosis not present

## 2019-04-18 DIAGNOSIS — I10 Essential (primary) hypertension: Secondary | ICD-10-CM | POA: Diagnosis not present

## 2019-04-18 DIAGNOSIS — I480 Paroxysmal atrial fibrillation: Secondary | ICD-10-CM | POA: Diagnosis not present

## 2019-04-18 DIAGNOSIS — I5022 Chronic systolic (congestive) heart failure: Secondary | ICD-10-CM | POA: Diagnosis not present

## 2019-04-21 DIAGNOSIS — I10 Essential (primary) hypertension: Secondary | ICD-10-CM | POA: Diagnosis not present

## 2019-04-21 DIAGNOSIS — E119 Type 2 diabetes mellitus without complications: Secondary | ICD-10-CM | POA: Diagnosis not present

## 2019-05-24 DIAGNOSIS — I480 Paroxysmal atrial fibrillation: Secondary | ICD-10-CM | POA: Diagnosis not present

## 2019-05-24 DIAGNOSIS — I5022 Chronic systolic (congestive) heart failure: Secondary | ICD-10-CM | POA: Diagnosis not present

## 2019-05-24 DIAGNOSIS — I10 Essential (primary) hypertension: Secondary | ICD-10-CM | POA: Diagnosis not present

## 2019-05-24 DIAGNOSIS — G4733 Obstructive sleep apnea (adult) (pediatric): Secondary | ICD-10-CM | POA: Diagnosis not present

## 2019-06-18 DIAGNOSIS — Z79899 Other long term (current) drug therapy: Secondary | ICD-10-CM | POA: Diagnosis not present

## 2019-06-18 DIAGNOSIS — R0602 Shortness of breath: Secondary | ICD-10-CM | POA: Diagnosis not present

## 2019-06-18 DIAGNOSIS — I10 Essential (primary) hypertension: Secondary | ICD-10-CM | POA: Diagnosis not present

## 2019-06-18 DIAGNOSIS — Z7901 Long term (current) use of anticoagulants: Secondary | ICD-10-CM | POA: Diagnosis not present

## 2019-06-18 DIAGNOSIS — R000 Tachycardia, unspecified: Secondary | ICD-10-CM | POA: Diagnosis not present

## 2019-06-18 DIAGNOSIS — Z888 Allergy status to other drugs, medicaments and biological substances status: Secondary | ICD-10-CM | POA: Diagnosis not present

## 2019-06-18 DIAGNOSIS — R069 Unspecified abnormalities of breathing: Secondary | ICD-10-CM | POA: Diagnosis not present

## 2019-06-18 DIAGNOSIS — Z87891 Personal history of nicotine dependence: Secondary | ICD-10-CM | POA: Diagnosis not present

## 2019-06-18 DIAGNOSIS — R05 Cough: Secondary | ICD-10-CM | POA: Diagnosis not present

## 2019-06-18 DIAGNOSIS — J0110 Acute frontal sinusitis, unspecified: Secondary | ICD-10-CM | POA: Diagnosis not present

## 2019-06-18 DIAGNOSIS — Z743 Need for continuous supervision: Secondary | ICD-10-CM | POA: Diagnosis not present

## 2019-06-18 DIAGNOSIS — R0902 Hypoxemia: Secondary | ICD-10-CM | POA: Diagnosis not present

## 2019-07-11 DIAGNOSIS — K625 Hemorrhage of anus and rectum: Secondary | ICD-10-CM | POA: Diagnosis not present

## 2019-07-18 DIAGNOSIS — I10 Essential (primary) hypertension: Secondary | ICD-10-CM | POA: Diagnosis not present

## 2019-07-18 DIAGNOSIS — J4520 Mild intermittent asthma, uncomplicated: Secondary | ICD-10-CM | POA: Diagnosis not present

## 2019-07-18 DIAGNOSIS — E119 Type 2 diabetes mellitus without complications: Secondary | ICD-10-CM | POA: Diagnosis not present

## 2019-07-18 DIAGNOSIS — J309 Allergic rhinitis, unspecified: Secondary | ICD-10-CM | POA: Diagnosis not present

## 2019-07-18 DIAGNOSIS — I4819 Other persistent atrial fibrillation: Secondary | ICD-10-CM | POA: Diagnosis not present

## 2019-08-02 DIAGNOSIS — Z01818 Encounter for other preprocedural examination: Secondary | ICD-10-CM | POA: Diagnosis not present

## 2019-08-30 DIAGNOSIS — Z01818 Encounter for other preprocedural examination: Secondary | ICD-10-CM | POA: Diagnosis not present

## 2019-09-01 DIAGNOSIS — I110 Hypertensive heart disease with heart failure: Secondary | ICD-10-CM | POA: Diagnosis not present

## 2019-09-01 DIAGNOSIS — K625 Hemorrhage of anus and rectum: Secondary | ICD-10-CM | POA: Diagnosis not present

## 2019-09-01 DIAGNOSIS — G4730 Sleep apnea, unspecified: Secondary | ICD-10-CM | POA: Diagnosis not present

## 2019-09-01 DIAGNOSIS — E119 Type 2 diabetes mellitus without complications: Secondary | ICD-10-CM | POA: Diagnosis not present

## 2019-09-01 DIAGNOSIS — I509 Heart failure, unspecified: Secondary | ICD-10-CM | POA: Diagnosis not present

## 2019-09-01 DIAGNOSIS — K6289 Other specified diseases of anus and rectum: Secondary | ICD-10-CM | POA: Diagnosis not present

## 2019-10-01 DIAGNOSIS — I480 Paroxysmal atrial fibrillation: Secondary | ICD-10-CM | POA: Diagnosis not present

## 2019-10-01 DIAGNOSIS — S8392XA Sprain of unspecified site of left knee, initial encounter: Secondary | ICD-10-CM | POA: Diagnosis not present

## 2019-10-01 DIAGNOSIS — M25561 Pain in right knee: Secondary | ICD-10-CM | POA: Diagnosis not present

## 2019-10-01 DIAGNOSIS — E785 Hyperlipidemia, unspecified: Secondary | ICD-10-CM | POA: Diagnosis not present

## 2019-10-01 DIAGNOSIS — G4730 Sleep apnea, unspecified: Secondary | ICD-10-CM | POA: Diagnosis not present

## 2019-10-01 DIAGNOSIS — Z8614 Personal history of Methicillin resistant Staphylococcus aureus infection: Secondary | ICD-10-CM | POA: Diagnosis not present

## 2019-10-01 DIAGNOSIS — Z20822 Contact with and (suspected) exposure to covid-19: Secondary | ICD-10-CM | POA: Diagnosis not present

## 2019-10-01 DIAGNOSIS — I110 Hypertensive heart disease with heart failure: Secondary | ICD-10-CM | POA: Diagnosis not present

## 2019-10-01 DIAGNOSIS — E119 Type 2 diabetes mellitus without complications: Secondary | ICD-10-CM | POA: Diagnosis not present

## 2019-10-01 DIAGNOSIS — S8391XA Sprain of unspecified site of right knee, initial encounter: Secondary | ICD-10-CM | POA: Diagnosis not present

## 2019-10-01 DIAGNOSIS — S76112A Strain of left quadriceps muscle, fascia and tendon, initial encounter: Secondary | ICD-10-CM | POA: Diagnosis not present

## 2019-10-01 DIAGNOSIS — M1712 Unilateral primary osteoarthritis, left knee: Secondary | ICD-10-CM | POA: Diagnosis not present

## 2019-10-01 DIAGNOSIS — R319 Hematuria, unspecified: Secondary | ICD-10-CM | POA: Diagnosis not present

## 2019-10-01 DIAGNOSIS — W109XXA Fall (on) (from) unspecified stairs and steps, initial encounter: Secondary | ICD-10-CM | POA: Diagnosis not present

## 2019-10-01 DIAGNOSIS — R0902 Hypoxemia: Secondary | ICD-10-CM | POA: Diagnosis not present

## 2019-10-01 DIAGNOSIS — W19XXXA Unspecified fall, initial encounter: Secondary | ICD-10-CM | POA: Diagnosis not present

## 2019-10-01 DIAGNOSIS — S76111A Strain of right quadriceps muscle, fascia and tendon, initial encounter: Secondary | ICD-10-CM | POA: Diagnosis not present

## 2019-10-01 DIAGNOSIS — I10 Essential (primary) hypertension: Secondary | ICD-10-CM | POA: Diagnosis not present

## 2019-10-01 DIAGNOSIS — M109 Gout, unspecified: Secondary | ICD-10-CM | POA: Diagnosis not present

## 2019-10-01 DIAGNOSIS — Z5329 Procedure and treatment not carried out because of patient's decision for other reasons: Secondary | ICD-10-CM | POA: Diagnosis not present

## 2019-10-01 DIAGNOSIS — R109 Unspecified abdominal pain: Secondary | ICD-10-CM | POA: Diagnosis not present

## 2019-10-01 DIAGNOSIS — I5032 Chronic diastolic (congestive) heart failure: Secondary | ICD-10-CM | POA: Diagnosis not present

## 2019-10-01 DIAGNOSIS — M25462 Effusion, left knee: Secondary | ICD-10-CM | POA: Diagnosis not present

## 2019-10-01 DIAGNOSIS — K567 Ileus, unspecified: Secondary | ICD-10-CM | POA: Diagnosis not present

## 2019-10-01 DIAGNOSIS — R140 Abdominal distension (gaseous): Secondary | ICD-10-CM | POA: Diagnosis not present

## 2019-10-01 DIAGNOSIS — Z7901 Long term (current) use of anticoagulants: Secondary | ICD-10-CM | POA: Diagnosis not present

## 2019-10-01 DIAGNOSIS — R52 Pain, unspecified: Secondary | ICD-10-CM | POA: Diagnosis not present

## 2019-10-01 DIAGNOSIS — Z7984 Long term (current) use of oral hypoglycemic drugs: Secondary | ICD-10-CM | POA: Diagnosis not present

## 2019-10-20 ENCOUNTER — Other Ambulatory Visit: Payer: Self-pay

## 2019-10-20 ENCOUNTER — Emergency Department (HOSPITAL_COMMUNITY): Payer: Medicare Other

## 2019-10-20 ENCOUNTER — Encounter (HOSPITAL_COMMUNITY): Payer: Self-pay | Admitting: Emergency Medicine

## 2019-10-20 ENCOUNTER — Inpatient Hospital Stay (HOSPITAL_COMMUNITY)
Admission: EM | Admit: 2019-10-20 | Discharge: 2019-11-09 | DRG: 987 | Disposition: A | Discharge status: Skilled Nursing Facility | Payer: Medicare Other | Attending: Internal Medicine | Admitting: Internal Medicine

## 2019-10-20 DIAGNOSIS — S83207A Unspecified tear of unspecified meniscus, current injury, left knee, initial encounter: Secondary | ICD-10-CM | POA: Diagnosis not present

## 2019-10-20 DIAGNOSIS — J9601 Acute respiratory failure with hypoxia: Secondary | ICD-10-CM | POA: Diagnosis not present

## 2019-10-20 DIAGNOSIS — S76122D Laceration of left quadriceps muscle, fascia and tendon, subsequent encounter: Secondary | ICD-10-CM | POA: Diagnosis not present

## 2019-10-20 DIAGNOSIS — K567 Ileus, unspecified: Secondary | ICD-10-CM

## 2019-10-20 DIAGNOSIS — Z888 Allergy status to other drugs, medicaments and biological substances status: Secondary | ICD-10-CM

## 2019-10-20 DIAGNOSIS — Z4682 Encounter for fitting and adjustment of non-vascular catheter: Secondary | ICD-10-CM | POA: Diagnosis not present

## 2019-10-20 DIAGNOSIS — S86811A Strain of other muscle(s) and tendon(s) at lower leg level, right leg, initial encounter: Secondary | ICD-10-CM

## 2019-10-20 DIAGNOSIS — M25561 Pain in right knee: Secondary | ICD-10-CM | POA: Diagnosis not present

## 2019-10-20 DIAGNOSIS — K56609 Unspecified intestinal obstruction, unspecified as to partial versus complete obstruction: Secondary | ICD-10-CM | POA: Diagnosis present

## 2019-10-20 DIAGNOSIS — R6521 Severe sepsis with septic shock: Secondary | ICD-10-CM | POA: Diagnosis not present

## 2019-10-20 DIAGNOSIS — I4891 Unspecified atrial fibrillation: Secondary | ICD-10-CM | POA: Diagnosis present

## 2019-10-20 DIAGNOSIS — I5022 Chronic systolic (congestive) heart failure: Secondary | ICD-10-CM | POA: Diagnosis not present

## 2019-10-20 DIAGNOSIS — Z8614 Personal history of Methicillin resistant Staphylococcus aureus infection: Secondary | ICD-10-CM | POA: Diagnosis not present

## 2019-10-20 DIAGNOSIS — I509 Heart failure, unspecified: Secondary | ICD-10-CM | POA: Diagnosis not present

## 2019-10-20 DIAGNOSIS — S76112A Strain of left quadriceps muscle, fascia and tendon, initial encounter: Secondary | ICD-10-CM | POA: Diagnosis not present

## 2019-10-20 DIAGNOSIS — S83242A Other tear of medial meniscus, current injury, left knee, initial encounter: Secondary | ICD-10-CM | POA: Diagnosis not present

## 2019-10-20 DIAGNOSIS — E876 Hypokalemia: Secondary | ICD-10-CM | POA: Diagnosis not present

## 2019-10-20 DIAGNOSIS — Z5329 Procedure and treatment not carried out because of patient's decision for other reasons: Secondary | ICD-10-CM | POA: Diagnosis not present

## 2019-10-20 DIAGNOSIS — I110 Hypertensive heart disease with heart failure: Secondary | ICD-10-CM | POA: Diagnosis not present

## 2019-10-20 DIAGNOSIS — N179 Acute kidney failure, unspecified: Secondary | ICD-10-CM

## 2019-10-20 DIAGNOSIS — D649 Anemia, unspecified: Secondary | ICD-10-CM | POA: Diagnosis present

## 2019-10-20 DIAGNOSIS — R1110 Vomiting, unspecified: Secondary | ICD-10-CM | POA: Diagnosis not present

## 2019-10-20 DIAGNOSIS — M1990 Unspecified osteoarthritis, unspecified site: Secondary | ICD-10-CM | POA: Diagnosis present

## 2019-10-20 DIAGNOSIS — Z6841 Body Mass Index (BMI) 40.0 and over, adult: Secondary | ICD-10-CM | POA: Diagnosis not present

## 2019-10-20 DIAGNOSIS — M25569 Pain in unspecified knee: Secondary | ICD-10-CM | POA: Diagnosis not present

## 2019-10-20 DIAGNOSIS — S76111A Strain of right quadriceps muscle, fascia and tendon, initial encounter: Secondary | ICD-10-CM | POA: Diagnosis present

## 2019-10-20 DIAGNOSIS — Z7901 Long term (current) use of anticoagulants: Secondary | ICD-10-CM

## 2019-10-20 DIAGNOSIS — M2550 Pain in unspecified joint: Secondary | ICD-10-CM | POA: Diagnosis not present

## 2019-10-20 DIAGNOSIS — S83242D Other tear of medial meniscus, current injury, left knee, subsequent encounter: Secondary | ICD-10-CM | POA: Diagnosis not present

## 2019-10-20 DIAGNOSIS — S76111D Strain of right quadriceps muscle, fascia and tendon, subsequent encounter: Secondary | ICD-10-CM

## 2019-10-20 DIAGNOSIS — D6869 Other thrombophilia: Secondary | ICD-10-CM | POA: Diagnosis not present

## 2019-10-20 DIAGNOSIS — N170 Acute kidney failure with tubular necrosis: Secondary | ICD-10-CM | POA: Insufficient documentation

## 2019-10-20 DIAGNOSIS — Z20822 Contact with and (suspected) exposure to covid-19: Secondary | ICD-10-CM | POA: Diagnosis present

## 2019-10-20 DIAGNOSIS — G4730 Sleep apnea, unspecified: Secondary | ICD-10-CM | POA: Diagnosis not present

## 2019-10-20 DIAGNOSIS — I480 Paroxysmal atrial fibrillation: Secondary | ICD-10-CM | POA: Diagnosis not present

## 2019-10-20 DIAGNOSIS — W19XXXA Unspecified fall, initial encounter: Secondary | ICD-10-CM

## 2019-10-20 DIAGNOSIS — I517 Cardiomegaly: Secondary | ICD-10-CM | POA: Diagnosis not present

## 2019-10-20 DIAGNOSIS — Z9989 Dependence on other enabling machines and devices: Secondary | ICD-10-CM | POA: Diagnosis not present

## 2019-10-20 DIAGNOSIS — M25562 Pain in left knee: Secondary | ICD-10-CM | POA: Diagnosis present

## 2019-10-20 DIAGNOSIS — Z03818 Encounter for observation for suspected exposure to other biological agents ruled out: Secondary | ICD-10-CM | POA: Diagnosis not present

## 2019-10-20 DIAGNOSIS — S86812A Strain of other muscle(s) and tendon(s) at lower leg level, left leg, initial encounter: Secondary | ICD-10-CM | POA: Diagnosis not present

## 2019-10-20 DIAGNOSIS — M109 Gout, unspecified: Secondary | ICD-10-CM | POA: Diagnosis not present

## 2019-10-20 DIAGNOSIS — G4733 Obstructive sleep apnea (adult) (pediatric): Secondary | ICD-10-CM | POA: Diagnosis not present

## 2019-10-20 DIAGNOSIS — I5032 Chronic diastolic (congestive) heart failure: Secondary | ICD-10-CM | POA: Diagnosis not present

## 2019-10-20 DIAGNOSIS — M6281 Muscle weakness (generalized): Secondary | ICD-10-CM | POA: Diagnosis not present

## 2019-10-20 DIAGNOSIS — K6389 Other specified diseases of intestine: Secondary | ICD-10-CM | POA: Diagnosis not present

## 2019-10-20 DIAGNOSIS — S8392XA Sprain of unspecified site of left knee, initial encounter: Secondary | ICD-10-CM | POA: Diagnosis not present

## 2019-10-20 DIAGNOSIS — R579 Shock, unspecified: Secondary | ICD-10-CM | POA: Diagnosis not present

## 2019-10-20 DIAGNOSIS — S76112D Strain of left quadriceps muscle, fascia and tendon, subsequent encounter: Secondary | ICD-10-CM | POA: Diagnosis not present

## 2019-10-20 DIAGNOSIS — I5042 Chronic combined systolic (congestive) and diastolic (congestive) heart failure: Secondary | ICD-10-CM | POA: Diagnosis present

## 2019-10-20 DIAGNOSIS — I4811 Longstanding persistent atrial fibrillation: Secondary | ICD-10-CM | POA: Diagnosis not present

## 2019-10-20 DIAGNOSIS — R109 Unspecified abdominal pain: Secondary | ICD-10-CM

## 2019-10-20 DIAGNOSIS — S83241A Other tear of medial meniscus, current injury, right knee, initial encounter: Secondary | ICD-10-CM | POA: Diagnosis present

## 2019-10-20 DIAGNOSIS — E662 Morbid (severe) obesity with alveolar hypoventilation: Secondary | ICD-10-CM | POA: Diagnosis present

## 2019-10-20 DIAGNOSIS — E860 Dehydration: Secondary | ICD-10-CM | POA: Diagnosis present

## 2019-10-20 DIAGNOSIS — E1165 Type 2 diabetes mellitus with hyperglycemia: Secondary | ICD-10-CM | POA: Diagnosis not present

## 2019-10-20 DIAGNOSIS — N19 Unspecified kidney failure: Secondary | ICD-10-CM

## 2019-10-20 DIAGNOSIS — R000 Tachycardia, unspecified: Secondary | ICD-10-CM | POA: Diagnosis present

## 2019-10-20 DIAGNOSIS — Z87891 Personal history of nicotine dependence: Secondary | ICD-10-CM | POA: Diagnosis not present

## 2019-10-20 DIAGNOSIS — K219 Gastro-esophageal reflux disease without esophagitis: Secondary | ICD-10-CM | POA: Diagnosis not present

## 2019-10-20 DIAGNOSIS — F329 Major depressive disorder, single episode, unspecified: Secondary | ICD-10-CM | POA: Diagnosis present

## 2019-10-20 DIAGNOSIS — W109XXA Fall (on) (from) unspecified stairs and steps, initial encounter: Secondary | ICD-10-CM | POA: Diagnosis present

## 2019-10-20 DIAGNOSIS — R0902 Hypoxemia: Secondary | ICD-10-CM | POA: Diagnosis not present

## 2019-10-20 DIAGNOSIS — Z452 Encounter for adjustment and management of vascular access device: Secondary | ICD-10-CM | POA: Diagnosis not present

## 2019-10-20 DIAGNOSIS — I482 Chronic atrial fibrillation, unspecified: Secondary | ICD-10-CM | POA: Diagnosis not present

## 2019-10-20 DIAGNOSIS — J811 Chronic pulmonary edema: Secondary | ICD-10-CM | POA: Diagnosis not present

## 2019-10-20 DIAGNOSIS — M94262 Chondromalacia, left knee: Secondary | ICD-10-CM | POA: Diagnosis present

## 2019-10-20 DIAGNOSIS — E261 Secondary hyperaldosteronism: Secondary | ICD-10-CM | POA: Diagnosis not present

## 2019-10-20 DIAGNOSIS — Z9181 History of falling: Secondary | ICD-10-CM

## 2019-10-20 DIAGNOSIS — S8391XA Sprain of unspecified site of right knee, initial encounter: Secondary | ICD-10-CM | POA: Diagnosis not present

## 2019-10-20 DIAGNOSIS — Z7984 Long term (current) use of oral hypoglycemic drugs: Secondary | ICD-10-CM | POA: Diagnosis not present

## 2019-10-20 DIAGNOSIS — I10 Essential (primary) hypertension: Secondary | ICD-10-CM | POA: Diagnosis present

## 2019-10-20 DIAGNOSIS — J302 Other seasonal allergic rhinitis: Secondary | ICD-10-CM | POA: Diagnosis present

## 2019-10-20 DIAGNOSIS — M1000 Idiopathic gout, unspecified site: Secondary | ICD-10-CM | POA: Diagnosis not present

## 2019-10-20 DIAGNOSIS — R0602 Shortness of breath: Secondary | ICD-10-CM | POA: Diagnosis not present

## 2019-10-20 DIAGNOSIS — E1151 Type 2 diabetes mellitus with diabetic peripheral angiopathy without gangrene: Secondary | ICD-10-CM | POA: Diagnosis not present

## 2019-10-20 DIAGNOSIS — Z7401 Bed confinement status: Secondary | ICD-10-CM | POA: Diagnosis not present

## 2019-10-20 DIAGNOSIS — R609 Edema, unspecified: Secondary | ICD-10-CM | POA: Diagnosis not present

## 2019-10-20 DIAGNOSIS — E785 Hyperlipidemia, unspecified: Secondary | ICD-10-CM | POA: Diagnosis present

## 2019-10-20 DIAGNOSIS — Y92009 Unspecified place in unspecified non-institutional (private) residence as the place of occurrence of the external cause: Secondary | ICD-10-CM

## 2019-10-20 DIAGNOSIS — M66871 Spontaneous rupture of other tendons, right ankle and foot: Secondary | ICD-10-CM | POA: Diagnosis not present

## 2019-10-20 DIAGNOSIS — Z883 Allergy status to other anti-infective agents status: Secondary | ICD-10-CM

## 2019-10-20 DIAGNOSIS — N390 Urinary tract infection, site not specified: Secondary | ICD-10-CM | POA: Diagnosis not present

## 2019-10-20 DIAGNOSIS — A419 Sepsis, unspecified organism: Secondary | ICD-10-CM | POA: Diagnosis not present

## 2019-10-20 DIAGNOSIS — S76102D Unspecified injury of left quadriceps muscle, fascia and tendon, subsequent encounter: Secondary | ICD-10-CM | POA: Diagnosis not present

## 2019-10-20 DIAGNOSIS — R140 Abdominal distension (gaseous): Secondary | ICD-10-CM

## 2019-10-20 DIAGNOSIS — E119 Type 2 diabetes mellitus without complications: Secondary | ICD-10-CM

## 2019-10-20 DIAGNOSIS — E1169 Type 2 diabetes mellitus with other specified complication: Secondary | ICD-10-CM | POA: Diagnosis not present

## 2019-10-20 DIAGNOSIS — Z978 Presence of other specified devices: Secondary | ICD-10-CM

## 2019-10-20 DIAGNOSIS — E1129 Type 2 diabetes mellitus with other diabetic kidney complication: Secondary | ICD-10-CM | POA: Diagnosis not present

## 2019-10-20 DIAGNOSIS — E1122 Type 2 diabetes mellitus with diabetic chronic kidney disease: Secondary | ICD-10-CM | POA: Diagnosis not present

## 2019-10-20 DIAGNOSIS — S86821D Laceration of other muscle(s) and tendon(s) at lower leg level, right leg, subsequent encounter: Secondary | ICD-10-CM | POA: Diagnosis not present

## 2019-10-20 DIAGNOSIS — M1712 Unilateral primary osteoarthritis, left knee: Secondary | ICD-10-CM | POA: Diagnosis not present

## 2019-10-20 DIAGNOSIS — R319 Hematuria, unspecified: Secondary | ICD-10-CM | POA: Diagnosis not present

## 2019-10-20 DIAGNOSIS — E1142 Type 2 diabetes mellitus with diabetic polyneuropathy: Secondary | ICD-10-CM | POA: Diagnosis not present

## 2019-10-20 DIAGNOSIS — I5040 Unspecified combined systolic (congestive) and diastolic (congestive) heart failure: Secondary | ICD-10-CM | POA: Diagnosis not present

## 2019-10-20 HISTORY — DX: Type 2 diabetes mellitus without complications: E11.9

## 2019-10-20 HISTORY — DX: Heart failure, unspecified: I50.9

## 2019-10-20 HISTORY — DX: Unspecified osteoarthritis, unspecified site: M19.90

## 2019-10-20 HISTORY — DX: Other seasonal allergic rhinitis: J30.2

## 2019-10-20 HISTORY — DX: Gastro-esophageal reflux disease without esophagitis: K21.9

## 2019-10-20 HISTORY — DX: Depression, unspecified: F32.A

## 2019-10-20 HISTORY — DX: Hyperlipidemia, unspecified: E78.5

## 2019-10-20 HISTORY — DX: Gout, unspecified: M10.9

## 2019-10-20 HISTORY — DX: Essential (primary) hypertension: I10

## 2019-10-20 LAB — CBC WITH DIFFERENTIAL/PLATELET
Abs Immature Granulocytes: 0.03 10*3/uL (ref 0.00–0.07)
Basophils Absolute: 0 10*3/uL (ref 0.0–0.1)
Basophils Relative: 0 %
Eosinophils Absolute: 0 10*3/uL (ref 0.0–0.5)
Eosinophils Relative: 0 %
HCT: 44 % (ref 39.0–52.0)
Hemoglobin: 13.7 g/dL (ref 13.0–17.0)
Immature Granulocytes: 0 %
Lymphocytes Relative: 11 %
Lymphs Abs: 1 10*3/uL (ref 0.7–4.0)
MCH: 27.6 pg (ref 26.0–34.0)
MCHC: 31.1 g/dL (ref 30.0–36.0)
MCV: 88.7 fL (ref 80.0–100.0)
Monocytes Absolute: 1.1 10*3/uL — ABNORMAL HIGH (ref 0.1–1.0)
Monocytes Relative: 12 %
Neutro Abs: 7.5 10*3/uL (ref 1.7–7.7)
Neutrophils Relative %: 77 %
Platelets: 695 10*3/uL — ABNORMAL HIGH (ref 150–400)
RBC: 4.96 MIL/uL (ref 4.22–5.81)
RDW: 14.2 % (ref 11.5–15.5)
WBC: 9.7 10*3/uL (ref 4.0–10.5)
nRBC: 0 % (ref 0.0–0.2)

## 2019-10-20 LAB — COMPREHENSIVE METABOLIC PANEL
ALT: 39 U/L (ref 0–44)
AST: 26 U/L (ref 15–41)
Albumin: 3.3 g/dL — ABNORMAL LOW (ref 3.5–5.0)
Alkaline Phosphatase: 67 U/L (ref 38–126)
Anion gap: 15 (ref 5–15)
BUN: 38 mg/dL — ABNORMAL HIGH (ref 6–20)
CO2: 22 mmol/L (ref 22–32)
Calcium: 9.8 mg/dL (ref 8.9–10.3)
Chloride: 100 mmol/L (ref 98–111)
Creatinine, Ser: 2.52 mg/dL — ABNORMAL HIGH (ref 0.61–1.24)
GFR calc Af Amer: 33 mL/min — ABNORMAL LOW (ref >60)
GFR calc non Af Amer: 29 mL/min — ABNORMAL LOW (ref >60)
Glucose, Bld: 124 mg/dL — ABNORMAL HIGH (ref 70–99)
Potassium: 4.5 mmol/L (ref 3.5–5.1)
Sodium: 137 mmol/L (ref 135–145)
Total Bilirubin: 1.7 mg/dL — ABNORMAL HIGH (ref 0.3–1.2)
Total Protein: 7.9 g/dL (ref 6.5–8.1)

## 2019-10-20 NOTE — ED Provider Notes (Signed)
[facility] EMERGENCY DEPARTMENT Provider Note   CSN: [id number] Arrival date & time: 10/20/19  1430     History Chief Complaint  Patient presents with  . Knee Pain    Douglas [name] is a 49 y.o. male.  49 y.o male with a PMH of CHF, DM, HTN, Acid reflux, Gout presents to the ED with a chief complaint of bilateral knee pain times few weeks.  Patient reports he was at home when he suddenly missed a step on his stairs, and said he landed on both knees, heard a loud pop.  He was transferred by EMS to [facility], where he was admitted for 4 weeks with rehab and PT.  Reports he has not been able to weight-bear since this incident occurred.  Unfortunately, due to hospital limitations they were unable to obtain an MRI although x-rays were within normal limits per patient.  He reports he left the facility today after not being able to obtain proper care according to patient.  Denies any back pain, other injury, shortness of breath or chest pain.  The history is provided by the patient and medical records.  Knee Pain Associated symptoms: no back pain and no fever        Past Medical History:  Diagnosis Date  . CHF (congestive heart failure) (HCC)   . Degenerative arthritis   . Diabetes mellitus without complication (HCC)   . Gout     There are no problems to display for this patient.   History reviewed. No pertinent surgical history.     History reviewed. No pertinent family history.  Social History   Tobacco Use  . Smoking status: Former Games developer  . Smokeless tobacco: Never Used  Substance Use Topics  . Alcohol use: Not Currently  . Drug use: Never    Home Medications Prior to Admission medications   Not on File    Allergies    Benazepril and Betadine [povidone iodine]  Review of Systems   Review of Systems  Constitutional: Negative for fever.  HENT: Negative for sore throat.   Respiratory: Negative for shortness of breath.     Cardiovascular: Negative for chest pain.  Gastrointestinal: Negative for abdominal pain and nausea.  Genitourinary: Negative for flank pain.  Musculoskeletal: Positive for arthralgias. Negative for back pain.  Skin: Negative for pallor and wound.  Neurological: Negative for light-headedness.  All other systems reviewed and are negative.   Physical Exam Updated Vital Signs BP (!) 137/59   Pulse (!) 113   Temp 98.1 F (36.7 C) (Oral)   Resp 17   Ht 6' (1.829 m)   Wt (!) 194.1 kg   SpO2 97%   BMI 58.05 kg/m   Physical Exam Vitals and nursing note reviewed.  Constitutional:      Appearance: Normal appearance. He is obese. He is not toxic-appearing.  HENT:     Head: Normocephalic and atraumatic.     Nose: Nose normal.     Mouth/Throat:     Mouth: Mucous membranes are moist.  Eyes:     Pupils: Pupils are equal, round, and reactive to light.  Cardiovascular:     Rate and Rhythm: Normal rate.     Comments: No BL pitting edema. No calf tenderness.  Pulmonary:     Effort: Pulmonary effort is normal.  Abdominal:     General: Abdomen is flat.     Tenderness: There is no abdominal tenderness. There is no right CVA tenderness, left CVA tenderness  or guarding.  Musculoskeletal:        General: Tenderness present. No swelling.     Cervical back: Normal range of motion and neck supple.     Right knee: Bony tenderness present. Decreased range of motion. Tenderness present over the patellar tendon.     Left knee: Bony tenderness present. Decreased range of motion. Tenderness present over the patellar tendon.     Right lower leg: No edema.     Left lower leg: No edema.     Comments: Pulses present, able to flex bilateral knees to 45 degree angle, unable to leg raise.  Patient is intact bilaterally.  Skin:    General: Skin is warm and dry.  Neurological:     Mental Status: He is alert and oriented to person, place, and time.     ED Results / Procedures / Treatments   Labs (all  labs ordered are listed, but only abnormal results are displayed) Labs Reviewed  CBC WITH DIFFERENTIAL/PLATELET - Abnormal; Notable for the following components:      Result Value   Platelets 695 (*)    Monocytes Absolute 1.1 (*)    All other components within normal limits  COMPREHENSIVE METABOLIC PANEL - Abnormal; Notable for the following components:   Glucose, Bld 124 (*)    BUN 38 (*)    Creatinine, Ser 2.52 (*)    Albumin 3.3 (*)    Total Bilirubin 1.7 (*)    GFR calc non Af Amer 29 (*)    GFR calc Af Amer 33 (*)    All other components within normal limits  RESPIRATORY PANEL BY RT PCR (FLU A&B, COVID)    EKG None  Radiology MR KNEE RIGHT WO CONTRAST  Result Date: 10/20/2019 CLINICAL DATA:  Recent right knee trauma, inability to stand EXAM: MRI OF THE RIGHT KNEE WITHOUT CONTRAST TECHNIQUE: Multiplanar, multisequence MR imaging of the knee was performed. No intravenous contrast was administered. COMPARISON:  10/01/2019 FINDINGS: MENISCI Medial meniscus: Diffuse degenerative changes are noted, with maceration of the posterior horn and body of the medial meniscus. There is a focal tear of the undersurface of the medial meniscus at the junction of the body and posterior horn. 1 cm parameniscal cyst along the posterior horn of the medial meniscus. Lateral meniscus:  No evidence of focal meniscal tear. LIGAMENTS Cruciates: Thickening of the ACL at the insertion on the tibia with intrasubstance increased T2 signal consistent with partial tear or chronic degenerative change. PCL is intact with normal signal. Collaterals: Medial and lateral collateral ligament complexes appear intact. CARTILAGE Patellofemoral: Irregularity and thinning of the patellar cartilage at its apex and along the lateral patellar facet. Medial: Marked thinning of the articular cartilage in the medial compartment, more pronounced along the tibial aspect. Lateral: Mild articular cartilage thinning within the lateral  compartment. Joint: There is a moderate suprapatellar joint effusion. Thickening of the plica is noted. There is abnormal edema within the Hoffa fat pad, with evidence of complete tear of the patellar tendon. Popliteal Fossa:  No Baker cyst. Intact popliteus tendon. Extensor Mechanism: There is a complete full-thickness tear of the patellar tendon with retraction. No evidence of avulsion fracture. The quadriceps tendon appears intact. Bones: No focal marrow signal abnormality. No fracture or dislocation. Other: There is diffuse subcutaneous edema most pronounced within the prepatellar and infrapatellar regions. IMPRESSION: 1. Full thickness full width tear of the patellar tendon with retraction. No evidence of patellar avulsion fracture. 2. Degenerative changes within the medial compartment  of the knee, with associated meniscal tear. 3. Moderate joint effusion, with diffuse subcutaneous edema as above. 4. Thickening and intrasubstance edema within the ACL, consistent with partial tear or mucoid degeneration. Electronically Signed   By: Randa Ngo M.D.   On: 10/20/2019 21:53   MR KNEE LEFT WO CONTRAST  Result Date: 10/20/2019 CLINICAL DATA:  [place], knee injury, abnormal quadriceps tendon on previous CT EXAM: MRI OF THE LEFT KNEE WITHOUT CONTRAST TECHNIQUE: Multiplanar, multisequence MR imaging of the knee was performed. No intravenous contrast was administered. COMPARISON:  10/01/2019 FINDINGS: MENISCI Medial meniscus:  Degenerative change without focal meniscal tear. Lateral meniscus: There may be a bucket-handle type tear of the lateral meniscus, with flipped fragment in the posteromedial aspect of the lateral compartment of the left knee. LIGAMENTS Cruciates: Thickening of the ACL without evidence of tear, likely related to chronic degenerative change. PCL is intact with normal signal characteristics. Collaterals: Fibers are indistinct at the femoral attachment of the medial collateral ligament, consistent  with partial tear or strain. LCL complex is intact. CARTILAGE Patellofemoral:  Mild chondromalacia at the patellar apex. Medial: Mild thinning of the articular cartilage in the medial compartment without focal defect. Lateral:  No chondral defect. Joint: There is a moderate suprapatellar joint effusion with plical thickening. Lateral subluxation of the patella within the trochlear groove. Likely disruption of the fibers of the medial patellar retinaculum at their femoral insertion. Popliteal Fossa:  No Baker cyst. Intact popliteus tendon. Extensor Mechanism: There is a full-thickness tear through the medial fibers of the quadriceps tendon mechanism. The lateral fibers appear intact. Greater than 50% of the with of the quadriceps tendon is disrupted. The patellar tendon is redundant and retracted but intact. Bones: No focal marrow signal abnormality. No fracture or dislocation. Other: None. IMPRESSION: 1. Full-thickness tear through the medial fibers of the quadriceps tendon mechanism. Greater than 50% of the with of the quadriceps tendon disrupted. 2. Lateral subluxation of the patella within the trochlear groove, with disruption of fibers at the femoral insertion of the medial patellar retinaculum. 3. Possible bucket-handle type tear of the lateral meniscus, with flipped fragment in the posteromedial aspect of the lateral compartment of the left knee. 4. Partial tear or strain of the femoral attachment of the medial collateral ligament. 5. Moderate suprapatellar joint effusion with plical thickening. 6. Thickening of the ACL without evidence of tear, likely related to chronic degenerative change. Electronically Signed   By: Randa Ngo M.D.   On: 10/20/2019 22:31    Procedures Procedures (including critical care time)  Medications Ordered in ED Medications - No data to display  ED Course  I have reviewed the triage vital signs and the nursing notes.  Pertinent labs & imaging results that were  available during my care of the patient were reviewed by me and considered in my medical decision making (see chart for details).  Clinical Course as of Oct 20 2303  Thu Oct 20, 2019  2259 No prior level for comparison  Creatinine(!): 2.52 [JS]    Clinical Course User Index [JS] Janeece Fitting, PA-C   MDM Rules/Calculators/A&P    Overly obese patient with a past medical history of diabetes, hypertension presents to the ED after a 4-week admission at [facility], reports he was there after fall, has been nonambulatory for the past 4 weeks.  Decided to arrive at [facility] in order to obtain MRI of bilateral knees as x-rays were within normal limits at prior hospital admission.  Reports he  has not been ambulatory in 4 weeks, will place consult for orthopedist for further recommendations.  8:00 PM Spoke to Dr. Roda Shutters who recommended BL knee MRI, admitted to hospitalist service. Also recommended bilateral DVT studies due to patient being immobile for the past 4 weeks.  Position of his labs remarkable with a CBC without any leukocytosis, hemoglobin is within normal limits.  CMP without any electro abnormality, BUN is slightly elevated, creatinine level is 2.5,prior history of "leaky kidney", no prior levels for comparison. MRI Knee left:  1. Full-thickness tear through the medial fibers of the quadriceps  tendon mechanism. Greater than 50% of the with of the quadriceps  tendon disrupted.  2. Lateral subluxation of the patella within the trochlear groove,  with disruption of fibers at the femoral insertion of the medial  patellar retinaculum.  3. Possible bucket-handle type tear of the lateral meniscus, with  flipped fragment in the posteromedial aspect of the lateral  compartment of the left knee.  4. Partial tear or strain of the femoral attachment of the medial  collateral ligament.  5. Moderate suprapatellar joint effusion with plical thickening.  6. Thickening of the ACL without  evidence of tear, likely related to  chronic degenerative change.       MRI Knee right:  1. Full thickness full width tear of the patellar tendon with  retraction. No evidence of patellar avulsion fracture.  2. Degenerative changes within the medial compartment of the knee,  with associated meniscal tear.  3. Moderate joint effusion, with diffuse subcutaneous edema as  above.  4. Thickening and intrasubstance edema within the ACL, consistent  with partial tear or mucoid degeneration.       10:59 PM COVID-19 swab has been ordered, I have spoken to hospitalist Dr. Earlene Plater will admit patient for further management.  Portions of this note were generated with Scientist, clinical (histocompatibility and immunogenetics). Dictation errors may occur despite best attempts at proofreading. Final Clinical Impression(s) / ED Diagnoses Final diagnoses:  Fall, initial encounter  Rupture of right quadriceps tendon, subsequent encounter    Rx / DC Orders ED Discharge Orders    None       Claude Manges, PA-C 10/20/19 2305    Virgina Norfolk, DO 10/20/19 2309

## 2019-10-20 NOTE — ED Triage Notes (Addendum)
Pt states he was recently hospitalized for a fall that hurt both knees. Xray did not show any fx, but pt needed an MRI for further investigation. Pt exceeded the weight limit of 350lb at the other facility. Pt left to have MRI here. This rN called our MRI- weight limit is 500lb, but also based on pt's shape/anatomy. Pt states he is not able to stand after his fall.

## 2019-10-20 NOTE — ED Notes (Signed)
Pt transported to mri 

## 2019-10-20 NOTE — H&P (Signed)
History and Physical        Hospital Admission Note Date: 10/20/2019  Patient name: Douglas [name] Medical record number: [id number] Date of birth: Aug 13, 1970 Age: 49 y.o. Gender: male  PCP: Toma Deiters, MD    Patient coming from: [facility]   I have reviewed all records in the [facility].    Chief Complaint:  Bilateral Knee Pain   HPI: Douglas [name] is 49 y.o. male with PMH of CHF, DM, HTN, gout who presents to ER with bilateral knee pain. Patient has been admitted to [facility] for 4 weeks for knee pain after a fall. He has been unable to bear weight since this injury. Hospital was unable to obtain MRI during this time period. Patient reports x-rays were normal.    ED work-up/course:  Overly obese patient with a past medical history of diabetes, hypertension presents to the ED after a 4-week admission at [facility], reports he was there after fall, has been nonambulatory for the past 4 weeks.  Decided to arrive at [facility] in order to obtain MRI of bilateral knees as x-rays were within normal limits at prior hospital admission.  Reports he has not been ambulatory in 4 weeks, will place consult for orthopedist for further recommendations.  8:00 PM Spoke to Dr. Roda Shutters who recommended BL knee MRI, admitted to hospitalist service. Also recommended bilateral DVT studies due to patient being immobile for the past 4 weeks.  Position of his labs remarkable with a CBC without any leukocytosis, hemoglobin is within normal limits.  CMP without any electro abnormality, BUN is slightly elevated, creatinine level is 2.5,prior history of "leaky kidney", no prior levels for comparison.  MRI left knee showed full thickness tear of the medial fibers of the quadriceps tendon. MRI right knee with full thickness tear of the patellar tendon.   Review of Systems: Positives marked in  'bold' Constitutional: Denies fever, chills, diaphoresis, poor appetite and fatigue.  HEENT: Denies photophobia, eye pain, redness, hearing loss, ear pain, congestion, sore throat, rhinorrhea, sneezing, mouth sores, trouble swallowing, neck pain, neck stiffness and tinnitus.   Respiratory: Denies SOB, DOE, cough, chest tightness,  and wheezing.   Cardiovascular: Denies chest pain, palpitations and leg swelling.  Gastrointestinal: Denies nausea, vomiting, abdominal pain, diarrhea, constipation, blood in stool and abdominal distention.  Genitourinary: Denies dysuria, urgency, frequency, hematuria, flank pain and difficulty urinating.  Musculoskeletal: Denies myalgias, back pain, joint swelling, arthralgias and gait problem.  Skin: Denies pallor, rash and wound.  Neurological: Denies dizziness, seizures, syncope, weakness, light-headedness, numbness and headaches.  Hematological: Denies adenopathy. Easy bruising, personal or family bleeding history  Psychiatric/Behavioral: Denies suicidal ideation, mood changes, confusion, nervousness, sleep disturbance and agitation  Past Medical History: Past Medical History:  Diagnosis Date  . CHF (congestive heart failure) (HCC)   . Degenerative arthritis   . Diabetes mellitus without complication (HCC)   . Gout     History reviewed. No pertinent surgical history.  Medications: Prior to Admission medications   Not on File    Allergies:   Allergies  Allergen Reactions  . Benazepril Anaphylaxis  . Betadine [Povidone Iodine]     rash  .  Prednisone Other (See Comments)    Headache.    Social History:  reports that he has quit smoking. He has never used smokeless tobacco. He reports previous alcohol use. He reports that he does not use drugs.  Family History: History reviewed. No pertinent family history.  Physical Exam: Blood pressure (!) 137/59, pulse (!) 113, temperature 98.1 F (36.7 C), temperature source Oral, resp. rate 17, height  6' (1.829 m), weight (!) 194.1 kg, SpO2 97 %. General: Alert, awake, oriented x3, in no acute distress. Morbidly obese.  Eyes: pink conjunctiva,anicteric sclera, pupils equal and reactive to light and accomodation, HEENT: normocephalic, atraumatic, oropharynx clear MM appear slightly dry  Neck: supple, no masses or lymphadenopathy, no goiter, no bruits, no JVD CVS: Regular rate and rhythm, without murmurs, rubs or gallops. No lower extremity edema or calf tenderness. Pedal pulses intact.  Resp : Clear to auscultation bilaterally, no wheezing, rales or rhonchi. GI : Soft, nontender, nondistended, positive bowel sounds, no masses. No hepatomegaly. No hernia.  Musculoskeletal: No clubbing or cyanosis, positive pedal pulses. TTP over patella bilaterally. Limited ROM with LE bilaterally.  Neuro: Grossly intact, no focal neurological deficits, strength 5/5 upper and lower extremities bilaterally Psych: alert and oriented x 3, normal mood and affect Skin: no rashes or lesions, warm and dry   LABS on Admission: I have personally reviewed all the labs and imagings below    Basic Metabolic Panel: Recent Labs  Lab 10/20/19 2037  NA 137  K 4.5  CL 100  CO2 22  GLUCOSE 124*  BUN 38*  CREATININE 2.52*  CALCIUM 9.8   Liver Function Tests: Recent Labs  Lab 10/20/19 2037  AST 26  ALT 39  ALKPHOS 67  BILITOT 1.7*  PROT 7.9  ALBUMIN 3.3*   No results for input(s): LIPASE, AMYLASE in the last 168 hours. No results for input(s): AMMONIA in the last 168 hours. CBC: Recent Labs  Lab 10/20/19 2037  WBC 9.7  NEUTROABS 7.5  HGB 13.7  HCT 44.0  MCV 88.7  PLT 695*   Cardiac Enzymes: No results for input(s): CKTOTAL, CKMB, CKMBINDEX, TROPONINI in the last 168 hours. BNP: Invalid input(s): POCBNP CBG: No results for input(s): GLUCAP in the last 168 hours.  Radiological Exams on Admission:  MR KNEE RIGHT WO CONTRAST  Result Date: 10/20/2019 CLINICAL DATA:  Recent right knee trauma,  inability to stand EXAM: MRI OF THE RIGHT KNEE WITHOUT CONTRAST TECHNIQUE: Multiplanar, multisequence MR imaging of the knee was performed. No intravenous contrast was administered. COMPARISON:  10/01/2019 FINDINGS: MENISCI Medial meniscus: Diffuse degenerative changes are noted, with maceration of the posterior horn and body of the medial meniscus. There is a focal tear of the undersurface of the medial meniscus at the junction of the body and posterior horn. 1 cm parameniscal cyst along the posterior horn of the medial meniscus. Lateral meniscus:  No evidence of focal meniscal tear. LIGAMENTS Cruciates: Thickening of the ACL at the insertion on the tibia with intrasubstance increased T2 signal consistent with partial tear or chronic degenerative change. PCL is intact with normal signal. Collaterals: Medial and lateral collateral ligament complexes appear intact. CARTILAGE Patellofemoral: Irregularity and thinning of the patellar cartilage at its apex and along the lateral patellar facet. Medial: Marked thinning of the articular cartilage in the medial compartment, more pronounced along the tibial aspect. Lateral: Mild articular cartilage thinning within the lateral compartment. Joint: There is a moderate suprapatellar joint effusion. Thickening of the plica is noted. There is abnormal  edema within the Hoffa fat pad, with evidence of complete tear of the patellar tendon. Popliteal Fossa:  No Baker cyst. Intact popliteus tendon. Extensor Mechanism: There is a complete full-thickness tear of the patellar tendon with retraction. No evidence of avulsion fracture. The quadriceps tendon appears intact. Bones: No focal marrow signal abnormality. No fracture or dislocation. Other: There is diffuse subcutaneous edema most pronounced within the prepatellar and infrapatellar regions. IMPRESSION: 1. Full thickness full width tear of the patellar tendon with retraction. No evidence of patellar avulsion fracture. 2. Degenerative  changes within the medial compartment of the knee, with associated meniscal tear. 3. Moderate joint effusion, with diffuse subcutaneous edema as above. 4. Thickening and intrasubstance edema within the ACL, consistent with partial tear or mucoid degeneration. Electronically Signed   By: Sharlet Salina M.D.   On: 10/20/2019 21:53   MR KNEE LEFT WO CONTRAST  Result Date: 10/20/2019 CLINICAL DATA:  Larey Seat, knee injury, abnormal quadriceps tendon on previous CT EXAM: MRI OF THE LEFT KNEE WITHOUT CONTRAST TECHNIQUE: Multiplanar, multisequence MR imaging of the knee was performed. No intravenous contrast was administered. COMPARISON:  10/01/2019 FINDINGS: MENISCI Medial meniscus:  Degenerative change without focal meniscal tear. Lateral meniscus: There may be a bucket-handle type tear of the lateral meniscus, with flipped fragment in the posteromedial aspect of the lateral compartment of the left knee. LIGAMENTS Cruciates: Thickening of the ACL without evidence of tear, likely related to chronic degenerative change. PCL is intact with normal signal characteristics. Collaterals: Fibers are indistinct at the femoral attachment of the medial collateral ligament, consistent with partial tear or strain. LCL complex is intact. CARTILAGE Patellofemoral:  Mild chondromalacia at the patellar apex. Medial: Mild thinning of the articular cartilage in the medial compartment without focal defect. Lateral:  No chondral defect. Joint: There is a moderate suprapatellar joint effusion with plical thickening. Lateral subluxation of the patella within the trochlear groove. Likely disruption of the fibers of the medial patellar retinaculum at their femoral insertion. Popliteal Fossa:  No Baker cyst. Intact popliteus tendon. Extensor Mechanism: There is a full-thickness tear through the medial fibers of the quadriceps tendon mechanism. The lateral fibers appear intact. Greater than 50% of the with of the quadriceps tendon is disrupted. The  patellar tendon is redundant and retracted but intact. Bones: No focal marrow signal abnormality. No fracture or dislocation. Other: None. IMPRESSION: 1. Full-thickness tear through the medial fibers of the quadriceps tendon mechanism. Greater than 50% of the with of the quadriceps tendon disrupted. 2. Lateral subluxation of the patella within the trochlear groove, with disruption of fibers at the femoral insertion of the medial patellar retinaculum. 3. Possible bucket-handle type tear of the lateral meniscus, with flipped fragment in the posteromedial aspect of the lateral compartment of the left knee. 4. Partial tear or strain of the femoral attachment of the medial collateral ligament. 5. Moderate suprapatellar joint effusion with plical thickening. 6. Thickening of the ACL without evidence of tear, likely related to chronic degenerative change. Electronically Signed   By: Sharlet Salina M.D.   On: 10/20/2019 22:31      EKG: Independently reviewed.    Assessment/Plan Active Problems:   Diabetes mellitus without complication (HCC)   CHF (congestive heart failure) (HCC)   Gout   Hypertension   Sleep apnea   Atrial fibrillation (HCC)   Rupture of left quadriceps tendon   Rupture of patellar tendon, right, initial encounter   Acute kidney injury (HCC)   Knee pain  Knee Pain with  Left Quadriceps Tear and Right Patellar Tear -admit to inpatient  -Dr. Roda Shutters with Ortho called by ED provider and reports will evaluate in AM if patient is surgical candidate -obtaining bilateral LE dopplers due to prolonged immobility although less likely as patient is on Eliquis  -will need to time holding of Eliquis appropriately with surgery, will continue for now pending eval  -tylenol for pain control, will evaluate if this is adequate   AKI  Cr 2.52. On April 28 was 0.7 at outside facility. Unclear etiology. Patient does have a history of CHF. Difficult to access volume status due to body habitus but does  report compliance with Lasix recently. Denies difficulty with urination. Reports has had normal PO intake but then admits to thirst and appears to have mildly dry mucus membranes. However, BUN:Cr ratio is 15 making prerenal less likely. Will likely need more optimal control of renal function prior to any type of surgical procedure.  -assess volume status as below  -hold Lasix and Losartan for now  -avoid nephrotoxic agents -repeat BMET in AM   CHF -will obtain CXR and BNP to see if there are any signs of volume overload on exam  -daily weights and I/Os  -resume Lasix as appropriate   Afib  -continue Diltiazem  -continue Eliquis   HTN  -continue Metoprolol 100 mg daily and Hydralazine 50 mg BID  -hold Losartan   T2DM -hold Metformin due to AKI  -sensitive SSI with CBGs AC/HS   OSA -CPAP QHS    DVT prophylaxis: Eliquis   CODE STATUS: Full   Consults called: Ortho   Family Communication: Admission, patients condition and plan of care including tests being ordered have been discussed with the patient who indicates understanding and agree with the plan and Code Status  Admission status: Inpatient   The medical decision making on this patient was of high complexity and the patient is at high risk for clinical deterioration, therefore this is a level 3 admission.  Severity of Illness:      The appropriate patient status for this patient is INPATIENT. Inpatient status is judged to be reasonable and necessary in order to provide the required intensity of service to ensure the patient's safety. The patient's presenting symptoms, physical exam findings, and initial radiographic and laboratory data in the context of their chronic comorbidities is felt to place them at high risk for further clinical deterioration. Furthermore, it is not anticipated that the patient will be medically stable for discharge from the hospital within 2 midnights of admission. The following factors support the  patient status of inpatient.   " The patient's presenting symptoms include bilateral knee pain. " The worrisome physical exam findings include TTP over knees. " The initial radiographic and laboratory data are worrisome because of Cr 2.5, patellar tendon rupture on MRI, quadriceps tendon rupture on MRI. " The chronic co-morbidities include Afib, T2DM, HTN, CHF, Morbid Obesity.   * I certify that at the point of admission it is my clinical judgment that the patient will require inpatient hospital care spanning beyond 2 midnights from the point of admission due to high intensity of service, high risk for further deterioration and high frequency of surveillance required.*    Time Spent on Admission: 38 min     De Hollingshead D.O.  [facility] 10/20/2019, 11:59 PM

## 2019-10-21 ENCOUNTER — Inpatient Hospital Stay (HOSPITAL_COMMUNITY): Payer: Medicare Other

## 2019-10-21 ENCOUNTER — Other Ambulatory Visit: Payer: Self-pay

## 2019-10-21 ENCOUNTER — Encounter (HOSPITAL_COMMUNITY): Payer: Self-pay | Admitting: Internal Medicine

## 2019-10-21 DIAGNOSIS — R609 Edema, unspecified: Secondary | ICD-10-CM

## 2019-10-21 DIAGNOSIS — S76112A Strain of left quadriceps muscle, fascia and tendon, initial encounter: Secondary | ICD-10-CM

## 2019-10-21 DIAGNOSIS — S86811A Strain of other muscle(s) and tendon(s) at lower leg level, right leg, initial encounter: Secondary | ICD-10-CM

## 2019-10-21 LAB — CBC
HCT: 41.6 % (ref 39.0–52.0)
Hemoglobin: 13.1 g/dL (ref 13.0–17.0)
MCH: 27.6 pg (ref 26.0–34.0)
MCHC: 31.5 g/dL (ref 30.0–36.0)
MCV: 87.6 fL (ref 80.0–100.0)
Platelets: 671 10*3/uL — ABNORMAL HIGH (ref 150–400)
RBC: 4.75 MIL/uL (ref 4.22–5.81)
RDW: 14.2 % (ref 11.5–15.5)
WBC: 10.3 10*3/uL (ref 4.0–10.5)
nRBC: 0 % (ref 0.0–0.2)

## 2019-10-21 LAB — BASIC METABOLIC PANEL
Anion gap: 14 (ref 5–15)
BUN: 46 mg/dL — ABNORMAL HIGH (ref 6–20)
CO2: 20 mmol/L — ABNORMAL LOW (ref 22–32)
Calcium: 9.4 mg/dL (ref 8.9–10.3)
Chloride: 102 mmol/L (ref 98–111)
Creatinine, Ser: 3.24 mg/dL — ABNORMAL HIGH (ref 0.61–1.24)
GFR calc Af Amer: 24 mL/min — ABNORMAL LOW (ref >60)
GFR calc non Af Amer: 21 mL/min — ABNORMAL LOW (ref >60)
Glucose, Bld: 138 mg/dL — ABNORMAL HIGH (ref 70–99)
Potassium: 4.2 mmol/L (ref 3.5–5.1)
Sodium: 136 mmol/L (ref 135–145)

## 2019-10-21 LAB — GLUCOSE, CAPILLARY
Glucose-Capillary: 140 mg/dL — ABNORMAL HIGH (ref 70–99)
Glucose-Capillary: 147 mg/dL — ABNORMAL HIGH (ref 70–99)
Glucose-Capillary: 132 mg/dL — ABNORMAL HIGH (ref 70–99)
Glucose-Capillary: 113 mg/dL — ABNORMAL HIGH (ref 70–99)

## 2019-10-21 LAB — LACTIC ACID, PLASMA: Lactic Acid, Venous: 1.5 mmol/L (ref 0.5–1.9)

## 2019-10-21 LAB — TSH: TSH: 1.38 u[IU]/mL (ref 0.350–4.500)

## 2019-10-21 LAB — D-DIMER, QUANTITATIVE: D-Dimer, Quant: 3.36 ug/mL-FEU — ABNORMAL HIGH (ref 0.00–0.50)

## 2019-10-21 LAB — RESPIRATORY PANEL BY RT PCR (FLU A&B, COVID)
Influenza A by PCR: NEGATIVE
Influenza B by PCR: NEGATIVE
SARS Coronavirus 2 by RT PCR: NEGATIVE

## 2019-10-21 LAB — HIV ANTIBODY (ROUTINE TESTING W REFLEX): HIV Screen 4th Generation wRfx: NONREACTIVE

## 2019-10-21 LAB — CK: Total CK: 344 U/L (ref 49–397)

## 2019-10-21 LAB — HEMOGLOBIN A1C
Hgb A1c MFr Bld: 6.8 % — ABNORMAL HIGH (ref 4.8–5.6)
Mean Plasma Glucose: 148.46 mg/dL

## 2019-10-21 LAB — BRAIN NATRIURETIC PEPTIDE: B Natriuretic Peptide: 49.3 pg/mL (ref 0.0–100.0)

## 2019-10-21 MED ORDER — MONTELUKAST SODIUM 10 MG PO TABS
10.0000 mg | ORAL_TABLET | Freq: Every day | ORAL | Status: DC
  Administered 2019-10-21: 10 mg via ORAL
  Filled 2019-10-21: qty 1

## 2019-10-21 MED ORDER — SENNOSIDES-DOCUSATE SODIUM 8.6-50 MG PO TABS
1.0000 | ORAL_TABLET | Freq: Two times a day (BID) | ORAL | Status: DC
  Administered 2019-10-21 – 2019-10-29 (×4): 1 via ORAL
  Filled 2019-10-21 (×6): qty 1

## 2019-10-21 MED ORDER — ACETAMINOPHEN 325 MG PO TABS
650.0000 mg | ORAL_TABLET | Freq: Four times a day (QID) | ORAL | Status: DC | PRN
  Administered 2019-10-21: 650 mg via ORAL
  Filled 2019-10-21 (×2): qty 2

## 2019-10-21 MED ORDER — CHLORHEXIDINE GLUCONATE CLOTH 2 % EX PADS
6.0000 | MEDICATED_PAD | Freq: Every day | CUTANEOUS | Status: DC
  Administered 2019-10-22 – 2019-11-06 (×13): 6 via TOPICAL

## 2019-10-21 MED ORDER — ALLOPURINOL 300 MG PO TABS
300.0000 mg | ORAL_TABLET | Freq: Every day | ORAL | Status: DC
  Administered 2019-10-21: 300 mg via ORAL
  Filled 2019-10-21: qty 1

## 2019-10-21 MED ORDER — METOPROLOL SUCCINATE ER 50 MG PO TB24: 50.0000 mg | ORAL_TABLET | Freq: Every day | ORAL | Status: DC

## 2019-10-21 MED ORDER — PANTOPRAZOLE SODIUM 40 MG PO TBEC
40.0000 mg | DELAYED_RELEASE_TABLET | Freq: Every day | ORAL | Status: DC
  Administered 2019-10-21: 40 mg via ORAL
  Filled 2019-10-21 (×2): qty 1

## 2019-10-21 MED ORDER — ONDANSETRON HCL 4 MG/2ML IJ SOLN
4.0000 mg | Freq: Three times a day (TID) | INTRAMUSCULAR | Status: DC | PRN
  Administered 2019-10-21 – 2019-10-30 (×9): 4 mg via INTRAVENOUS
  Filled 2019-10-21 (×11): qty 2

## 2019-10-21 MED ORDER — PROMETHAZINE HCL 25 MG/ML IJ SOLN
12.5000 mg | Freq: Four times a day (QID) | INTRAMUSCULAR | Status: DC | PRN
  Administered 2019-10-21 (×2): 12.5 mg via INTRAVENOUS
  Filled 2019-10-21 (×2): qty 1

## 2019-10-21 MED ORDER — SODIUM CHLORIDE 0.9 % IV SOLN: INTRAVENOUS | Status: DC

## 2019-10-21 MED ORDER — ONDANSETRON HCL 4 MG/2ML IJ SOLN
4.0000 mg | Freq: Once | INTRAMUSCULAR | Status: AC
  Administered 2019-10-21: 4 mg via INTRAVENOUS
  Filled 2019-10-21: qty 2

## 2019-10-21 MED ORDER — POLYETHYLENE GLYCOL 3350 17 G PO PACK
17.0000 g | PACK | Freq: Two times a day (BID) | ORAL | Status: DC
  Administered 2019-10-21: 17 g via ORAL
  Filled 2019-10-21 (×3): qty 1

## 2019-10-21 MED ORDER — DILTIAZEM HCL ER COATED BEADS 180 MG PO CP24
360.0000 mg | ORAL_CAPSULE | Freq: Every day | ORAL | Status: DC
  Administered 2019-10-21: 360 mg via ORAL
  Filled 2019-10-21: qty 2

## 2019-10-21 MED ORDER — ACETAMINOPHEN 650 MG RE SUPP
650.0000 mg | Freq: Four times a day (QID) | RECTAL | Status: DC | PRN
  Administered 2019-10-21: 650 mg via RECTAL
  Filled 2019-10-21 (×2): qty 1

## 2019-10-21 MED ORDER — ATORVASTATIN CALCIUM 10 MG PO TABS
20.0000 mg | ORAL_TABLET | Freq: Every day | ORAL | Status: DC
  Administered 2019-10-21: 20 mg via ORAL
  Filled 2019-10-21: qty 2

## 2019-10-21 MED ORDER — METOPROLOL SUCCINATE ER 50 MG PO TB24
100.0000 mg | ORAL_TABLET | Freq: Every day | ORAL | Status: DC
  Administered 2019-10-21: 100 mg via ORAL
  Filled 2019-10-21: qty 2

## 2019-10-21 MED ORDER — MOMETASONE FURO-FORMOTEROL FUM 200-5 MCG/ACT IN AERO
2.0000 | INHALATION_SPRAY | Freq: Two times a day (BID) | RESPIRATORY_TRACT | Status: DC
  Administered 2019-10-21 – 2019-11-09 (×38): 2 via RESPIRATORY_TRACT
  Filled 2019-10-21 (×4): qty 8.8

## 2019-10-21 MED ORDER — SERTRALINE HCL 50 MG PO TABS
50.0000 mg | ORAL_TABLET | Freq: Every day | ORAL | Status: DC
  Administered 2019-10-21 – 2019-11-09 (×15): 50 mg via ORAL
  Filled 2019-10-21 (×16): qty 1

## 2019-10-21 MED ORDER — SIMETHICONE 80 MG PO CHEW
80.0000 mg | CHEWABLE_TABLET | Freq: Four times a day (QID) | ORAL | Status: DC | PRN
  Administered 2019-11-05: 80 mg via ORAL
  Filled 2019-10-21 (×2): qty 1

## 2019-10-21 MED ORDER — INSULIN ASPART 100 UNIT/ML ~~LOC~~ SOLN
0.0000 [IU] | Freq: Three times a day (TID) | SUBCUTANEOUS | Status: DC
  Administered 2019-10-23: 1 [IU] via SUBCUTANEOUS

## 2019-10-21 MED ORDER — SODIUM CHLORIDE 0.9 % IV BOLUS
1000.0000 mL | Freq: Once | INTRAVENOUS | Status: AC
  Administered 2019-10-21: 1000 mL via INTRAVENOUS

## 2019-10-21 MED ORDER — APIXABAN 5 MG PO TABS
5.0000 mg | ORAL_TABLET | Freq: Two times a day (BID) | ORAL | Status: DC
  Administered 2019-10-21 (×2): 5 mg via ORAL
  Filled 2019-10-21 (×3): qty 1

## 2019-10-21 MED ORDER — HYDRALAZINE HCL 50 MG PO TABS
50.0000 mg | ORAL_TABLET | Freq: Two times a day (BID) | ORAL | Status: DC
  Administered 2019-10-21 (×2): 50 mg via ORAL
  Filled 2019-10-21 (×2): qty 1

## 2019-10-21 NOTE — Consult Note (Signed)
ORTHOPAEDIC CONSULTATION  REQUESTING PHYSICIAN: Kathlen Mody, MD  Chief Complaint: Bilateral knee pain  HPI: Douglas [name] is a 49 y.o. male who presents with bilateral knee pain.  He presented to the [facility] ED about 4 weeks ago after he had fallen directly on both of his knees from some stairs.  He was unable to ambulate and walk and therefore he was admitted to the hospital for about 4 weeks and did some exercises from bed.  As far as I can tell he was never evaluated by orthopedic surgery.  He felt frustrated with the care that he was given and left AMA and presented to the [facility] ED yesterday.  MRI of the left knee shows a high-grade partial tear of the quadriceps and medial retinaculum with possible bucket handle tear lateral meniscus.  MRI of the right knee shows a complete right patella tendon rupture with patella alta.  Orthopedics consulted for evaluation.  Past Medical History:  Diagnosis Date  . CHF (congestive heart failure) (HCC)   . Degenerative arthritis   . Diabetes mellitus without complication (HCC)   . Gout    History reviewed. No pertinent surgical history. Social History   Socioeconomic History  . Marital status: Divorced    Spouse name: Geri Seminole  . Number of children: 2  . Years of education: 22  . Highest education level: High school graduate  Occupational History  . Occupation: disabled  Tobacco Use  . Smoking status: Former Smoker    Quit date: 06/16/2014    Years since quitting: 5.3  . Smokeless tobacco: Never Used  Substance and Sexual Activity  . Alcohol use: Not Currently  . Drug use: Never  . Sexual activity: Not Currently  Other Topics Concern  . Not on file  Social History Narrative  . Not on file   Social Determinants of Health   Financial Resource Strain:   . Difficulty of Paying Living Expenses:   Food Insecurity:   . Worried About Programme researcher, broadcasting/film/video in the Last Year:   . Barista in the Last Year:     Transportation Needs:   . Freight forwarder (Medical):   Marland Kitchen Lack of Transportation (Non-Medical):   Physical Activity:   . Days of Exercise per Week:   . Minutes of Exercise per Session:   Stress:   . Feeling of Stress :   Social Connections:   . Frequency of Communication with Friends and Family:   . Frequency of Social Gatherings with Friends and Family:   . Attends Religious Services:   . Active Member of Clubs or Organizations:   . Attends Banker Meetings:   Marland Kitchen Marital Status:    History reviewed. No pertinent family history. - negative except otherwise stated in the family history section Allergies  Allergen Reactions  . Benazepril Anaphylaxis  . Betadine [Povidone Iodine]     rash  . Prednisone Other (See Comments)    Headache.   Prior to Admission medications   Medication Sig Start Date End Date Taking? Authorizing Provider  allopurinol (ZYLOPRIM) 300 MG tablet Take 300 mg by mouth daily. 09/14/19  Yes [provider]  atorvastatin (LIPITOR) 20 MG tablet Take 1 tablet by mouth daily.   Yes [provider]  diltiazem (CARDIZEM CD) 360 MG 24 hr capsule Take 360 mg by mouth daily. 07/18/19  Yes [provider]  ELIQUIS 5 MG TABS tablet Take 5 mg by mouth 2 (two) times daily.  08/17/19  Yes [provider]  fluticasone (FLONASE) 50 MCG/ACT nasal spray Place 1 spray into both nostrils daily as needed. 08/24/19  Yes [provider]  Fluticasone-Salmeterol (ADVAIR) 250-50 MCG/DOSE AEPB Inhale 1 puff into the lungs 2 (two) times daily as needed for shortness of breath or wheezing. 07/18/19  Yes [provider]  furosemide (LASIX) 40 MG tablet Take 40 mg by mouth daily. 02/03/19 02/03/20 Yes [provider]  HM FEXOFENADINE HCL 180 MG tablet Take 180 mg by mouth daily as needed. 07/18/19  Yes [provider]  hydrALAZINE (APRESOLINE) 50 MG tablet Take 50 mg by mouth 2 (two) times daily. 07/18/19  Yes  [provider]  losartan (COZAAR) 100 MG tablet Take 100 mg by mouth daily. 09/14/19  Yes [provider]  metFORMIN (GLUCOPHAGE) 1000 MG tablet Take 1,000 mg by mouth daily. 09/14/19  Yes [provider]  metoprolol succinate (TOPROL-XL) 50 MG 24 hr tablet Take 100 mg by mouth daily. 08/03/19  Yes [provider]  montelukast (SINGULAIR) 10 MG tablet Take 10 mg by mouth daily as needed. 07/18/19  Yes [provider]  Omega-3 1000 MG CAPS Take 1 capsule by mouth daily.   Yes [provider]  omeprazole (PRILOSEC) 20 MG capsule Take 20 mg by mouth daily. 09/14/19  Yes [provider]  POTASSIUM PO Take 1 tablet by mouth daily.   Yes [provider]  sertraline (ZOLOFT) 50 MG tablet Take 50 mg by mouth daily. 07/18/19  Yes [provider]   DG Chest 2 View  Result Date: 10/21/2019 CLINICAL DATA:  49 year old male with renal failure. EXAM: CHEST - 2 VIEW COMPARISON:  Chest radiograph dated 01/26/2019. FINDINGS: Shallow inspiration. No focal consolidation, pleural effusion, pneumothorax. Mild cardiomegaly. No acute osseous pathology. IMPRESSION: 1. No active cardiopulmonary disease. 2. Mild cardiomegaly. Electronically Signed   By: Anner Crete M.D.   On: 10/21/2019 00:45   MR KNEE RIGHT WO CONTRAST  Result Date: 10/20/2019 CLINICAL DATA:  Recent right knee trauma, inability to stand EXAM: MRI OF THE RIGHT KNEE WITHOUT CONTRAST TECHNIQUE: Multiplanar, multisequence MR imaging of the knee was performed. No intravenous contrast was administered. COMPARISON:  10/01/2019 FINDINGS: MENISCI Medial meniscus: Diffuse degenerative changes are noted, with maceration of the posterior horn and body of the medial meniscus. There is a focal tear of the undersurface of the medial meniscus at the junction of the body and posterior horn. 1 cm parameniscal cyst along the posterior horn of the medial meniscus. Lateral meniscus:  No evidence of  focal meniscal tear. LIGAMENTS Cruciates: Thickening of the ACL at the insertion on the tibia with intrasubstance increased T2 signal consistent with partial tear or chronic degenerative change. PCL is intact with normal signal. Collaterals: Medial and lateral collateral ligament complexes appear intact. CARTILAGE Patellofemoral: Irregularity and thinning of the patellar cartilage at its apex and along the lateral patellar facet. Medial: Marked thinning of the articular cartilage in the medial compartment, more pronounced along the tibial aspect. Lateral: Mild articular cartilage thinning within the lateral compartment. Joint: There is a moderate suprapatellar joint effusion. Thickening of the plica is noted. There is abnormal edema within the Hoffa fat pad, with evidence of complete tear of the patellar tendon. Popliteal Fossa:  No Baker cyst. Intact popliteus tendon. Extensor Mechanism: There is a complete full-thickness tear of the patellar tendon with retraction. No evidence of avulsion fracture. The quadriceps tendon appears intact. Bones: No focal marrow signal abnormality. No fracture or  dislocation. Other: There is diffuse subcutaneous edema most pronounced within the prepatellar and infrapatellar regions. IMPRESSION: 1. Full thickness full width tear of the patellar tendon with retraction. No evidence of patellar avulsion fracture. 2. Degenerative changes within the medial compartment of the knee, with associated meniscal tear. 3. Moderate joint effusion, with diffuse subcutaneous edema as above. 4. Thickening and intrasubstance edema within the ACL, consistent with partial tear or mucoid degeneration. Electronically Signed   By: Sharlet Salina M.D.   On: 10/20/2019 21:53   MR KNEE LEFT WO CONTRAST  Result Date: 10/20/2019 CLINICAL DATA:  Larey Seat, knee injury, abnormal quadriceps tendon on previous CT EXAM: MRI OF THE LEFT KNEE WITHOUT CONTRAST TECHNIQUE: Multiplanar, multisequence MR imaging of the knee  was performed. No intravenous contrast was administered. COMPARISON:  10/01/2019 FINDINGS: MENISCI Medial meniscus:  Degenerative change without focal meniscal tear. Lateral meniscus: There may be a bucket-handle type tear of the lateral meniscus, with flipped fragment in the posteromedial aspect of the lateral compartment of the left knee. LIGAMENTS Cruciates: Thickening of the ACL without evidence of tear, likely related to chronic degenerative change. PCL is intact with normal signal characteristics. Collaterals: Fibers are indistinct at the femoral attachment of the medial collateral ligament, consistent with partial tear or strain. LCL complex is intact. CARTILAGE Patellofemoral:  Mild chondromalacia at the patellar apex. Medial: Mild thinning of the articular cartilage in the medial compartment without focal defect. Lateral:  No chondral defect. Joint: There is a moderate suprapatellar joint effusion with plical thickening. Lateral subluxation of the patella within the trochlear groove. Likely disruption of the fibers of the medial patellar retinaculum at their femoral insertion. Popliteal Fossa:  No Baker cyst. Intact popliteus tendon. Extensor Mechanism: There is a full-thickness tear through the medial fibers of the quadriceps tendon mechanism. The lateral fibers appear intact. Greater than 50% of the with of the quadriceps tendon is disrupted. The patellar tendon is redundant and retracted but intact. Bones: No focal marrow signal abnormality. No fracture or dislocation. Other: None. IMPRESSION: 1. Full-thickness tear through the medial fibers of the quadriceps tendon mechanism. Greater than 50% of the with of the quadriceps tendon disrupted. 2. Lateral subluxation of the patella within the trochlear groove, with disruption of fibers at the femoral insertion of the medial patellar retinaculum. 3. Possible bucket-handle type tear of the lateral meniscus, with flipped fragment in the posteromedial aspect of  the lateral compartment of the left knee. 4. Partial tear or strain of the femoral attachment of the medial collateral ligament. 5. Moderate suprapatellar joint effusion with plical thickening. 6. Thickening of the ACL without evidence of tear, likely related to chronic degenerative change. Electronically Signed   By: Sharlet Salina M.D.   On: 10/20/2019 22:31   DG Abd Portable 1V  Result Date: 10/21/2019 CLINICAL DATA:  Abdominal pain. Non bilious vomiting. EXAM: PORTABLE ABDOMEN - 1 VIEW COMPARISON:  None. FINDINGS: Mild gaseous distention of large and small bowel is present. There is some gaseous distention the stomach. Definite free air is present. No obstruction is evident. Gas is seen into the distal colon. Changes are present spine. IMPRESSION: Mild gaseous distention of large and small bowel without obstruction. This may represent ileus. Electronically Signed   By: Marin Roberts M.D.   On: 10/21/2019 04:17   - pertinent xrays, CT, MRI studies were reviewed and independently interpreted  Positive ROS: All other systems have been reviewed and were otherwise negative with the exception of those mentioned in the HPI and  as above.  Physical Exam: General: No acute distress, morbidly obese Cardiovascular: No pedal edema Respiratory: No cyanosis, no use of accessory musculature GI: No organomegaly, abdomen is soft and non-tender Skin: No lesions in the area of chief complaint Neurologic: Sensation intact distally Psychiatric: Patient is at baseline mood and affect Lymphatic: No axillary or cervical lymphadenopathy  MUSCULOSKELETAL:  He is unable to perform straight leg raise on either leg.  He has tenderness to palpation over the right proximal patella tendon and left medial retinaculum and medial quadriceps.  No significant joint effusion.  Exam is otherwise limited by morbid obesity, lack of effort, pain.  Assessment: Left high-grade quadriceps tear and medial retinacular tear.   Possible lateral meniscus tear Right complete patella tendon rupture.  Plan: These are very unfortunate findings for the patient given that they are 58 weeks old at this point.  His super morbid obesity will certainly make surgery more challenging as well as the postoperative rehabilitation.  He is at risk for DVT given the immobility during the last 4 weeks.  Doppler ordered for today.  I will need to discuss this case with my colleagues to determine best course of action for the patient.  Patient may have a diet today.  Thank you for the consult and the opportunity to see Douglas [name]  N. Glee Arvin, MD [facility] 8:18 AM

## 2019-10-21 NOTE — Progress Notes (Signed)
Paged by NP from  who is covering floor pages at [facility] requesting a patient at [facility] be seen. Rapid response was called due to tachypnea and tachycardia. Patient was admitted yesterday for worsening knee pain but subsequently developed SBO vs ileus and NGT was placed and Surgery following. Patient also with AKI.   Patient currently reports feeling fine. Denies any chest pain or SOB.  Vitals:   10/21/19 2006 10/21/19 2009 10/21/19 2020 10/21/19 2108  BP: 118/60   95/60  Pulse: (!) 124 (!) 124  (!) 120  Resp: (!) 29 (!) 29  (!) 26  Temp: 99 F (37.2 C)  100.1 F (37.8 C) 97.6 F (36.4 C)  TempSrc: Oral  Rectal Oral  SpO2: 92% 94%  94%  Weight:      Height:       General: Alert, awake, oriented x3, in no acute distress. Morbidly obese.  CVS: Tachycardic with regular rhythm, without murmurs, rubs or gallops. No lower extremity edema or calf tenderness.  Resp : Clear to auscultation bilaterally, no wheezing, rales or rhonchi. GI : Distended, non-tender  Musculoskeletal: No LE edema  Neuro: Grossly intact, no focal neurological deficits, strength 5/5 upper and lower extremities bilaterally Psych: alert and oriented x 3, normal mood and affect Skin: no rashes or lesions, warm and dry  A/P:  Reviewed last Echo from [facility]. Patient with EF 46% and grade II diastolic dysfunction. Urine studies pending for FeUria. Will give another fluid bolus. Add Tele.  Blood cx and lactate ordered.  EKG with sinus tachycardia.  D-dimer ordered by NP; patient has been on home Eliquis for A fib. On room air with normal sats. Cont home CPAP at night.  Discontinued Hydralazine and decreased Metoprolol to 50 mg due to possible infection and borderline low BP's.  Will keep on floor for now. If lactate elevated or BP's drop, will consider Progressive vs ICU.   Jerilynn Birkenhead, MD [phone]

## 2019-10-21 NOTE — Progress Notes (Signed)
Initial Nutrition Assessment  DOCUMENTATION CODES:   Morbid obesity  INTERVENTION:   -RD will follow for diet advancement and supplement diet as appropriate  NUTRITION DIAGNOSIS:   Inadequate oral intake related to altered GI function as evidenced by NPO status.  GOAL:   Patient will meet greater than or equal to 90% of their needs  MONITOR:   Diet advancement, Labs, Weight trends, Skin, I & O's  REASON FOR ASSESSMENT:   Malnutrition Screening Tool    ASSESSMENT:   Douglas [name] is 49 y.o. male with PMH of CHF, DM, HTN, gout who presents to ER with bilateral knee pain. Patient has been admitted to [facility] for 4 weeks for knee pain after a fall. He has been unable to bear weight since this injury. Hospital was unable to obtain MRI during this time period. Patient reports x-rays were normal.  Pt admitted with knee pain with left quadriceps tear and rt patellar tear.   Reviewed I/O's: -500 ml x 24 hours and -500 ml since admission  Emesis: 500 ml x 24 hours  Pt NPO, due to vomiting and abdominal distention, as well as concern for ileus.   Per orthopedics notes, pt with left high-grade quadriceps tear, medial retinacular tear, possible lateral meniscus tear, and right complete patella tendon rupture. Ortho team plans to discuss best course of action for pt.  Attempted to speak with pt, however, out of room at time of visit. Unable to perform nutrition-focused physical exam or obtain further nutrition-related history at this time. Per chart review, pt has been immobile for 4 weeks.   Medications reviewed and include 0.9% sodium chloride infusion @ 75 ml/hr.   Obesity is a complex, chronic medical condition that is optimally managed by a multidisciplinary care team. Weight loss is not an ideal goal for an acute inpatient hospitalization. However, if further work-up for obesity is warranted, consider outpatient referral to outpatient bariatric service and/or  [place].   Lab Results  Component Value Date   HGBA1C 6.8 (H) 10/21/2019   PTA DM medications are 1000 mg metformin BID.   Labs reviewed: no CBGS recorded (inpatient orders for glycemic control are 0-9 units insulin aspart TID with meals).   Diet Order:   Diet Order            Diet NPO time specified  Diet effective now              EDUCATION NEEDS:   Not appropriate for education at this time  Skin:  Skin Assessment: Skin Integrity Issues: Skin Integrity Issues:: Other (Comment) Incisions: - Other: skin tear to rt and lt buttocks; MASD to back, thigh, scrotum, groin, buttocks  Last BM:  Unknown  Height:   Ht Readings from Last 1 Encounters:  10/21/19 6' (1.829 m)    Weight:   Wt Readings from Last 1 Encounters:  10/21/19 (!) 186 kg    Ideal Body Weight:  80.9 kg  BMI:  Body mass index is 55.61 kg/m.  Estimated Nutritional Needs:   Kcal:  2200-2400  Protein:  125-160 grams  Fluid:  > 2.2 L    Levada Schilling, RD, LDN, CDCES Registered Dietitian II Certified Diabetes Care and Education Specialist Please refer to [facility] for RD and/or RD on-call/weekend/after hours pager

## 2019-10-21 NOTE — Progress Notes (Signed)
Foley catheter inserted per order. Peri care provided before insertion of catheter. Roj RN assisted with insertion.

## 2019-10-21 NOTE — Progress Notes (Signed)
Per Radiology note NG tube advanced 10 cm. Gastric content began to come up, tube connected to LIWS per order. 1000 ml of output in the first 5 min. Followed by by another 1000 ml of brown gastric output. Patient able to tolerate advancement of NGT well. Per patient nausea is resolved.

## 2019-10-21 NOTE — Consult Note (Signed)
Reason for Consult:AKI Referring Physician: Blake Divine, MD  Douglas [name] is an 49 y.o. male has a PMH significant for super morbid obesity, DM, gout, chronic combined systolic and diastolic CHF (EF 40%, Grade II DD with elevaed filling pressures), OSA/OHS, HTN, and HLD who fell at home and injured his knees which required a 4 week stay at [facility] for PT and rehab.  He left the outside hospital because he wasn't getting any better and unable to stand with PT.  He came directly to [facility] ED yesterday with his wife.  IN the ED he was found to AKI with Scr of 2.52 (was 0.78 on 10/12/19), and MRI of his knees revealed extensive injuries bilaterally, including a full thickness tear through left quadriceps tendon, right patellar tendon rupture, lateral and medial meniscus tears and was admitted for further evaluation.  We were consulted for the development of worsening AKI.  The trend in Scr is seen below.  He admits to taking NSAIDs for his knee pain and was also taking losartan concomitantly.  His losartan and lasix were held upon admission.  His hospitalization has been complicated by the development of N/V and likely ileus vs. SBO as seen on abdominal films.  Surgery has been consulted and CT scan is pending.   Trend in Creatinine: Creatinine, Ser  Date/Time Value Ref Range Status  10/21/2019 04:43 AM 3.24 (H) 0.61 - 1.24 mg/dL Final  81/44/8185 63:14 PM 2.52 (H) 0.61 - 1.24 mg/dL Final  97/07/6376 5.88 0.6  - 1.3 mg/dL Final  50/27/7412 8.78 0.6 - 1.3 mg/dL Final    PMH:   Past Medical History:  Diagnosis Date  . CHF (congestive heart failure) (HCC)   . Degenerative arthritis   . Diabetes mellitus without complication (HCC)   . Gout     PSH:  History reviewed. No pertinent surgical history.  Allergies:  Allergies  Allergen Reactions  . Benazepril Anaphylaxis  . Betadine [Povidone Iodine]     rash  . Prednisone Other (See Comments)    Headache.    Medications:   Prior to  Admission medications   Medication Sig Start Date End Date Taking? Authorizing Provider  allopurinol (ZYLOPRIM) 300 MG tablet Take 300 mg by mouth daily. 09/14/19  Yes [provider]  atorvastatin (LIPITOR) 20 MG tablet Take 1 tablet by mouth daily.   Yes [provider]  diltiazem (CARDIZEM CD) 360 MG 24 hr capsule Take 360 mg by mouth daily. 07/18/19  Yes [provider]  ELIQUIS 5 MG TABS tablet Take 5 mg by mouth 2 (two) times daily. 08/17/19  Yes [provider]  fluticasone (FLONASE) 50 MCG/ACT nasal spray Place 1 spray into both nostrils daily as needed. 08/24/19  Yes [provider]  Fluticasone-Salmeterol (ADVAIR) 250-50 MCG/DOSE AEPB Inhale 1 puff into the lungs 2 (two) times daily as needed for shortness of breath or wheezing. 07/18/19  Yes [provider]  furosemide (LASIX) 40 MG tablet Take 40 mg by mouth daily. 02/03/19 02/03/20 Yes [provider]  HM FEXOFENADINE HCL 180 MG tablet Take 180 mg by mouth daily as needed. 07/18/19  Yes [provider]  hydrALAZINE (APRESOLINE) 50 MG tablet Take 50 mg by mouth 2 (two) times daily. 07/18/19  Yes [provider]  losartan (COZAAR) 100 MG tablet Take 100 mg by mouth daily. 09/14/19  Yes [provider]  metFORMIN (GLUCOPHAGE) 1000 MG tablet Take 1,000 mg by mouth daily. 09/14/19  Yes [provider]  metoprolol  succinate (TOPROL-XL) 50 MG 24 hr tablet Take 100 mg by mouth daily. 08/03/19  Yes [provider]  montelukast (SINGULAIR) 10 MG tablet Take 10 mg by mouth daily as needed. 07/18/19  Yes [provider]  Omega-3 1000 MG CAPS Take 1 capsule by mouth daily.   Yes [provider]  omeprazole (PRILOSEC) 20 MG capsule Take 20 mg by mouth daily. 09/14/19  Yes [provider]  POTASSIUM PO Take 1 tablet by mouth daily.   Yes [provider]  sertraline (ZOLOFT) 50 MG tablet Take 50 mg by mouth daily. 07/18/19  Yes  [provider]    Inpatient medications: . allopurinol  300 mg Oral Daily  . apixaban  5 mg Oral BID  . atorvastatin  20 mg Oral Daily  . diltiazem  360 mg Oral Daily  . hydrALAZINE  50 mg Oral BID  . insulin aspart  0-9 Units Subcutaneous TID WC  . metoprolol succinate  100 mg Oral Daily  . mometasone-formoterol  2 puff Inhalation BID  . montelukast  10 mg Oral QHS  . pantoprazole  40 mg Oral Daily  . polyethylene glycol  17 g Oral BID  . senna-docusate  1 tablet Oral BID  . sertraline  50 mg Oral Daily    Discontinued Meds:  There are no discontinued medications.  Social History:  reports that he quit smoking about 5 years ago. He has never used smokeless tobacco. He reports previous alcohol use. He reports that he does not use drugs.  Family History:  History reviewed. No pertinent family history.  A comprehensive review of systems was negative except for: Gastrointestinal: positive for abdominal pain, nausea, vomiting and no bowel movement for 3 days and no flatus Musculoskeletal: positive for bilateral knee pain Weight change:   Intake/Output Summary (Last 24 hours) at 10/21/2019 1403 Last data filed at 10/21/2019 1331 Gross per 24 hour  Intake 0 ml  Output 1500 ml  Net -1500 ml   BP (!) 126/98 (BP Location: Left Arm)   Pulse 98   Temp 97.7 F (36.5 C) (Oral)   Resp 18   Ht 6' (1.829 m)   Wt (!) 186 kg   SpO2 93%   BMI 55.61 kg/m  Vitals:   10/20/19 2230 10/21/19 0105 10/21/19 0120 10/21/19 0743  BP: (!) 137/59 122/62 102/69 (!) 126/98  Pulse: (!) 113 (!) 54 100 98  Resp:   18 18  Temp:  98 F (36.7 C) 98.8 F (37.1 C) 97.7 F (36.5 C)  TempSrc:  Oral Oral Oral  SpO2: 97% 96% 97% 93%  Weight:   (!) 186 kg   Height:   6' (1.829 m)      General appearance: alert, cooperative, mild distress and morbidly obese Head: Normocephalic, without obvious abnormality, atraumatic Resp: diminished breath sounds bilaterally Cardio: irregularly irregular  rhythm and no rub GI: obese, distended, tense, + tenderness, some abdominal wall edema Extremities: edema of knees bilaterally with tenderness, no pitting edema  Labs: Basic Metabolic Panel: Recent Labs  Lab 10/20/19 2037 10/21/19 0443  NA 137 136  K 4.5 4.2  CL 100 102  CO2 22 20*  GLUCOSE 124* 138*  BUN 38* 46*  CREATININE 2.52* 3.24*  ALBUMIN 3.3*  --   CALCIUM 9.8 9.4   Liver Function Tests: Recent Labs  Lab 10/20/19 2037  AST 26  ALT 39  ALKPHOS 67  BILITOT 1.7*  PROT 7.9  ALBUMIN 3.3*   No results for  input(s): LIPASE, AMYLASE in the last 168 hours. No results for input(s): AMMONIA in the last 168 hours. CBC: Recent Labs  Lab 10/20/19 2037 10/21/19 0443  WBC 9.7 10.3  NEUTROABS 7.5  --   HGB 13.7 13.1  HCT 44.0 41.6  MCV 88.7 87.6  PLT 695* 671*   PT/INR: @LABRCNTIP (inr:5) Cardiac Enzymes: ) Recent Labs  Lab 10/21/19 1113  CKTOTAL 344   CBG: Recent Labs  Lab 10/21/19 0743 10/21/19 1150  GLUCAP 140* 147*    Iron Studies: No results for input(s): IRON, TIBC, TRANSFERRIN, FERRITIN in the last 168 hours.  Xrays/Other Studies: DG Chest 2 View  Result Date: 10/21/2019 CLINICAL DATA:  50 year old male with renal failure. EXAM: CHEST - 2 VIEW COMPARISON:  Chest radiograph dated 01/26/2019. FINDINGS: Shallow inspiration. No focal consolidation, pleural effusion, pneumothorax. Mild cardiomegaly. No acute osseous pathology. IMPRESSION: 1. No active cardiopulmonary disease. 2. Mild cardiomegaly. Electronically Signed   By: 03/28/2019 M.D.   On: 10/21/2019 00:45   DG Abd 1 View  Result Date: 10/21/2019 CLINICAL DATA:  NG tube placement EXAM: ABDOMEN - 1 VIEW COMPARISON:  None. FINDINGS: There is no nasogastric tube identified. There is gaseous distension of the small bowel and colon. There is no evidence of pneumoperitoneum, portal venous gas or pneumatosis. There are no pathologic calcifications along the expected course of the ureters. The osseous  structures are unremarkable. IMPRESSION: No nasogastric tube identified. Electronically Signed   By: 12/21/2019   On: 10/21/2019 12:53   MR KNEE RIGHT WO CONTRAST  Result Date: 10/20/2019 CLINICAL DATA:  Recent right knee trauma, inability to stand EXAM: MRI OF THE RIGHT KNEE WITHOUT CONTRAST TECHNIQUE: Multiplanar, multisequence MR imaging of the knee was performed. No intravenous contrast was administered. COMPARISON:  10/01/2019 FINDINGS: MENISCI Medial meniscus: Diffuse degenerative changes are noted, with maceration of the posterior horn and body of the medial meniscus. There is a focal tear of the undersurface of the medial meniscus at the junction of the body and posterior horn. 1 cm parameniscal cyst along the posterior horn of the medial meniscus. Lateral meniscus:  No evidence of focal meniscal tear. LIGAMENTS Cruciates: Thickening of the ACL at the insertion on the tibia with intrasubstance increased T2 signal consistent with partial tear or chronic degenerative change. PCL is intact with normal signal. Collaterals: Medial and lateral collateral ligament complexes appear intact. CARTILAGE Patellofemoral: Irregularity and thinning of the patellar cartilage at its apex and along the lateral patellar facet. Medial: Marked thinning of the articular cartilage in the medial compartment, more pronounced along the tibial aspect. Lateral: Mild articular cartilage thinning within the lateral compartment. Joint: There is a moderate suprapatellar joint effusion. Thickening of the plica is noted. There is abnormal edema within the Hoffa fat pad, with evidence of complete tear of the patellar tendon. Popliteal Fossa:  No Baker cyst. Intact popliteus tendon. Extensor Mechanism: There is a complete full-thickness tear of the patellar tendon with retraction. No evidence of avulsion fracture. The quadriceps tendon appears intact. Bones: No focal marrow signal abnormality. No fracture or dislocation. Other: There is  diffuse subcutaneous edema most pronounced within the prepatellar and infrapatellar regions. IMPRESSION: 1. Full thickness full width tear of the patellar tendon with retraction. No evidence of patellar avulsion fracture. 2. Degenerative changes within the medial compartment of the knee, with associated meniscal tear. 3. Moderate joint effusion, with diffuse subcutaneous edema as above. 4. Thickening and intrasubstance edema within the ACL, consistent with partial tear or mucoid degeneration.  Electronically Signed   By: Sharlet SalinaMichael  Brown M.D.   On: 10/20/2019 21:53   MR KNEE LEFT WO CONTRAST  Result Date: 10/20/2019 CLINICAL DATA:  Larey SeatFell, knee injury, abnormal quadriceps tendon on previous CT EXAM: MRI OF THE LEFT KNEE WITHOUT CONTRAST TECHNIQUE: Multiplanar, multisequence MR imaging of the knee was performed. No intravenous contrast was administered. COMPARISON:  10/01/2019 FINDINGS: MENISCI Medial meniscus:  Degenerative change without focal meniscal tear. Lateral meniscus: There may be a bucket-handle type tear of the lateral meniscus, with flipped fragment in the posteromedial aspect of the lateral compartment of the left knee. LIGAMENTS Cruciates: Thickening of the ACL without evidence of tear, likely related to chronic degenerative change. PCL is intact with normal signal characteristics. Collaterals: Fibers are indistinct at the femoral attachment of the medial collateral ligament, consistent with partial tear or strain. LCL complex is intact. CARTILAGE Patellofemoral:  Mild chondromalacia at the patellar apex. Medial: Mild thinning of the articular cartilage in the medial compartment without focal defect. Lateral:  No chondral defect. Joint: There is a moderate suprapatellar joint effusion with plical thickening. Lateral subluxation of the patella within the trochlear groove. Likely disruption of the fibers of the medial patellar retinaculum at their femoral insertion. Popliteal Fossa:  No Baker cyst. Intact  popliteus tendon. Extensor Mechanism: There is a full-thickness tear through the medial fibers of the quadriceps tendon mechanism. The lateral fibers appear intact. Greater than 50% of the with of the quadriceps tendon is disrupted. The patellar tendon is redundant and retracted but intact. Bones: No focal marrow signal abnormality. No fracture or dislocation. Other: None. IMPRESSION: 1. Full-thickness tear through the medial fibers of the quadriceps tendon mechanism. Greater than 50% of the with of the quadriceps tendon disrupted. 2. Lateral subluxation of the patella within the trochlear groove, with disruption of fibers at the femoral insertion of the medial patellar retinaculum. 3. Possible bucket-handle type tear of the lateral meniscus, with flipped fragment in the posteromedial aspect of the lateral compartment of the left knee. 4. Partial tear or strain of the femoral attachment of the medial collateral ligament. 5. Moderate suprapatellar joint effusion with plical thickening. 6. Thickening of the ACL without evidence of tear, likely related to chronic degenerative change. Electronically Signed   By: Sharlet SalinaMichael  Brown M.D.   On: 10/20/2019 22:31   DG Abd 2 Views  Result Date: 10/21/2019 CLINICAL DATA:  Abdominal distention. EXAM: ABDOMEN - 2 VIEW COMPARISON:  10/16/2019. FINDINGS: Abdomen is incompletely imaged. Multiple distended loops of small and large bowel are noted. Bowel obstruction or adynamic ileus could present in this fashion. Follow-up exam suggested to demonstrate resolution. No free air identified. IMPRESSION: Multiple distended loops of small large bowel noted. Bowel obstruction or adynamic ileus could present this fashion. Follow-up exam suggested to demonstrate resolution. Electronically Signed   By: Maisie Fushomas  Register   On: 10/21/2019 10:39   DG Abd Portable 1V  Result Date: 10/21/2019 CLINICAL DATA:  Abdominal pain. Non bilious vomiting. EXAM: PORTABLE ABDOMEN - 1 VIEW COMPARISON:  None.  FINDINGS: Mild gaseous distention of large and small bowel is present. There is some gaseous distention the stomach. Definite free air is present. No obstruction is evident. Gas is seen into the distal colon. Changes are present spine. IMPRESSION: Mild gaseous distention of large and small bowel without obstruction. This may represent ileus. Electronically Signed   By: Marin Robertshristopher  Mattern M.D.   On: 10/21/2019 04:17     Assessment/Plan: 1.  AKI- presumably due to ischemic ATN in  setting of hypotension, volume depletion, acute illness, as well as NSAID use and concomitant ARB therapy.  Also concerning for bladder outlet obstruction as he tells me he had a foley catheter that was removed before he was discharged yesterday from [facility].  CT scan of abdomen and pelvis are pending.  Will order bladder scan as well and he may require foley catheter insertion.  His serum creatinine was normal a week ago. 1. Continue to hold ARB and lasix 2. Avoid all NSAIDs and COX-II I's 3. Bladder scan 4. CT of abdomen to r/o obstruction 5. Check CK to r/o rhabdo 2. Bilateral knee injuries with tendon damage as well as menisci.  Ortho evaluating but injury occurred 4 weeks ago and his super morbid obesity, AKI, and ileus vs. SBO will delay intervention. 3. N/V/abdominal distension- abd series with dilated loops of bowel, possible ileus vs. SBO.  Surgery consulted and awaiting CT scan of abdomen. 4. Atrial fibrillation 5. HTN 6. Morbid obesity 7. DM- per primary 8. Chronic combined systolic and diastolic CHF- stable for now. 9. Debility- pt unable to stand or walk due to obesity and knee injuries.   Julien Nordmann Elide Stalzer 10/21/2019, 2:03 PM

## 2019-10-21 NOTE — Progress Notes (Signed)
   10/21/19 2006  Vitals  Temp 99 F (37.2 C)  Temp Source Oral  BP 118/60  MAP (mmHg) 73  BP Method Automatic  Pulse Rate (!) 124  Pulse Rate Source Monitor  Resp (!) 29  Oxygen Therapy  SpO2 92 %  MEWS Score  MEWS Temp 0  MEWS Systolic 0  MEWS Pulse 2  MEWS RR 2  MEWS LOC 0  MEWS Score 4  MEWS Score Color Red    See MEWS flowsheet.  Charge, RRT and provider notified.  Red MEWS algorithm started.  RRT and provider -Dr. Morene Antu to bedside.  Orders received and implemented - see Epic.    Bladder scanned x 2 - 0 ml.  Placed on telemetry.  Bolus liter NS x 3.  Labs drawn & resulted - provider notified.    AC - bedside.  Pt to be transferred to higher level of care - progressive unit 2c, room 3.

## 2019-10-21 NOTE — Progress Notes (Signed)
Bilateral lower extremity venous duplex has been completed. Preliminary results can be found in CV Proc through chart review.   10/21/19 11:44 AM Olen Cordial RVT

## 2019-10-21 NOTE — Progress Notes (Signed)
Pt admitted earlier this am by Dr Earlene Plater, please see her detailed H&P.   49 year old gentleman history of CHF-diabetes, hypertension and gout came in for bilateral knee pain.  Patient reports he was seen in hospital for knee pain secondary to a mechanical fall.  Patient was unable to ambulate for almost 4 weeks due to knee pain.  He presents to [facility], ED and underwent MRIs of the bilateral knees.  The left knee shows full-thickness tear of the medial fibers of the quadriceps tendon and MRI of the right knee shows full-thickness tear of the patellar tendon.  Orthopedics consulted hand recommended vascular studies and probably surgical repair. Meanwhile patient reported worsening abdominal distention since 1 week, bowel movement 3 days ago and persistent nausea and vomiting. X-rays of the abdomen showed distended small bowel loops consistent with SBO versus ileus.  General surgery consulted and an NG tube was placed.  Patient was also admitted for acute kidney injury probably secondary to dehydration and poor renal perfusion.  She was started on IV fluids and the CT abdomen pelvis ordered for further evaluation.  Nephrology consulted for further evaluation.    Pt seen and examined.  Alert and appears uncomfortable.  Lungs diminished at bases,  abd is firm, distended, tender bowel sounds hypoactive.     Plan:  1. NPO, NG tube placement, IV fluids.  2. CT ABD and pelvis.     Kathlen Mody, MD.

## 2019-10-21 NOTE — Progress Notes (Signed)
Patient could not wear CPAP due to Emesis and possible aspiration.

## 2019-10-21 NOTE — Progress Notes (Signed)
RT asked nurse to call when pt is ready for CPAP. Nurse stated the patient had emesis and took the mask off last night 5/6 and could not wear the CPAP

## 2019-10-21 NOTE — Significant Event (Signed)
Rapid Response Event Note  Overview: Called originally at 2008 to report MEWS-4 for HR-124, RR-29. At that time, pt's Ax T-99. I suggested the RN get a rectal temp and give pt rectal tylenol then to recheck temp in an hour. I got called again at 2037 to evaluate pt for PCU vs ICU d/t MEWs and decreased UOP.    Initial Focused Assessment: Pt laying in bed in no distress. Pt alert and oriented, denies SOB/pain. Lungs clear and diminished. Abd large, firm, distended. NGT present with bilious drainage. I increased sx(it wasn't on a lot) and placed it on continuous sx and immediately got 600cc dark green/brown bilious output. I then changed it back to LIWS. Skin cool to touch. Pt pulled up in bed as he had slid down. T-100.1, HR-120(ST), BP-95/60, RR-26, SpO2-94% on RA.    Interventions: Tylenol supp given when RRT first notified.  Jon Billings, NP notified by bedside RN PTA RRT and ordered: 1L NS bolus LA, TSH, BC x 2, d-dimer  On RRT arrival: EKG-ST Foley cath flushed with 10cc NS and 10cc NS out Bladder scan done-"0" NGT placed on continuous sx-600cc bilious output immediately  Dr. Morene Antu to bedside and ordered: Additional 1L NS  Cardiac monitoring Hydralazine d/c'd Metoprolol changed from 100mg  daily to 50mg  daily  Plan of Care (if not transferred): Give additional NS bolus, await lab results. Re-evaluate need to tx to higher level of care after these interventions done(ex: increased LA, decreased BP, distress). Please call RRT if further assistance needed. Event Summary:  , NP notified PTA RRT and Dr. to bedside to assess pt. Called: 2037 Arrived: 2045  2038

## 2019-10-21 NOTE — ED Notes (Signed)
Attempted to call report x1. No answer from floor.

## 2019-10-21 NOTE — Progress Notes (Addendum)
RN paged NP regarding patient complaints of gas, constipation, and taut distended abdomen.  Added senna and Miralax and received another call regarding brown liquid emesis.  Abdominal X-ray ordered and shows possible ileus.  Changed diet to NPO, and added IVF at 75/hr.  Two floor RNs noted no smell of blood or feces from emesis.  Pt with second episode of emesis while awaiting x-ray results

## 2019-10-21 NOTE — Consult Note (Signed)
Reason for Consult:ileus Referring Physician: Dr Janeann ForehandWallace  [name] [name] [name] is an 49 y.o. male.  HPI: [age] yom with mmp presents after prolonged hospital course in [place] after a fall down stairs.  He is here for ortho evaluation.  He cannot get mri for this.  He has no abd surgery.  Has not had bm since Wednesday and no flatus, having n/v,  No fevers. Not eating now.  We were asked to see after plain film shows ileus.    Past Medical History:  Diagnosis Date  . CHF (congestive heart failure) (HCC)   . Degenerative arthritis   . Diabetes mellitus without complication (HCC)   . Gout     History reviewed. No pertinent surgical history.  History reviewed. No pertinent family history.  Social History:  reports that he quit smoking about 5 years ago. He has never used smokeless tobacco. He reports previous alcohol use. He reports that he does not use drugs.  Allergies:  Allergies  Allergen Reactions  . Benazepril Anaphylaxis  . Betadine [Povidone Iodine]     rash  . Prednisone Other (See Comments)    Headache.    Medications: I have reviewed the patient's current medications.  Results for orders placed or performed during the hospital encounter of 10/20/19 (from the past 48 hour(s))  CBC with Differential     Status: Abnormal   Collection Time: 10/20/19  8:37 PM  Result Value Ref Range   WBC 9.7 4.0 - 10.5 K/uL   RBC 4.96 4.22 - 5.81 MIL/uL   Hemoglobin 13.7 13.0 - 17.0 g/dL   HCT 69.644.0 29.539.0 - 28.452.0 %   MCV 88.7 80.0 - 100.0 fL   MCH 27.6 26.0 - 34.0 pg   MCHC 31.1 30.0 - 36.0 g/dL   RDW 13.214.2 44.011.5 - 10.215.5 %   Platelets 695 (H) 150 - 400 K/uL   nRBC 0.0 0.0 - 0.2 %   Neutrophils Relative % 77 %   Neutro Abs 7.5 1.7 - 7.7 K/uL   Lymphocytes Relative 11 %   Lymphs Abs 1.0 0.7 - 4.0 K/uL   Monocytes Relative 12 %   Monocytes Absolute 1.1 (H) 0.1 - 1.0 K/uL   Eosinophils Relative 0 %   Eosinophils Absolute 0.0 0.0 - 0.5 K/uL   Basophils Relative 0 %   Basophils Absolute 0.0  0.0 - 0.1 K/uL   Immature Granulocytes 0 %   Abs Immature Granulocytes 0.03 0.00 - 0.07 K/uL    Comment: Performed at [facility], [address] [place], [place] [id number]  Comprehensive metabolic panel     Status: Abnormal   Collection Time: 10/20/19  8:37 PM  Result Value Ref Range   Sodium 137 135 - 145 mmol/L   Potassium 4.5 3.5 - 5.1 mmol/L   Chloride 100 98 - 111 mmol/L   CO2 22 22 - 32 mmol/L   Glucose, Bld 124 (H) 70 - 99 mg/dL    Comment: Glucose reference range applies only to samples taken after fasting for at least 8 hours.   BUN 38 (H) 6 - 20 mg/dL   Creatinine, Ser 6.442.52 (H) 0.61 - 1.24 mg/dL   Calcium 9.8 8.9 - 03.410.3 mg/dL   Total Protein 7.9 6.5 - 8.1 g/dL   Albumin 3.3 (L) 3.5 - 5.0 g/dL   AST 26 15 - 41 U/L   ALT 39 0 - 44 U/L   Alkaline Phosphatase 67 38 - 126 U/L   Total Bilirubin 1.7 (H)  0.3 - 1.2 mg/dL   GFR calc non Af Amer 29 (L) >60 mL/min   GFR calc Af Amer 33 (L) >60 mL/min   Anion gap 15 5 - 15    Comment: Performed at [facility], [address], [place], [place] [address]  Respiratory Panel by RT PCR (Flu A&B, Covid) - Nasopharyngeal Swab     Status: None   Collection Time: 10/20/19 10:37 PM   Specimen: Nasopharyngeal Swab  Result Value Ref Range   SARS Coronavirus 2 by RT PCR NEGATIVE NEGATIVE    Comment: (NOTE) SARS-CoV-2 target nucleic acids are NOT DETECTED. The SARS-CoV-2 RNA is generally detectable in upper respiratoy specimens during the acute phase of infection. The lowest concentration of SARS-CoV-2 viral copies this assay can detect is 131 copies/mL. A negative result does not preclude SARS-Cov-2 infection and should not be used as the sole basis for treatment or other patient management decisions. A negative result may occur with  improper specimen collection/handling, submission of specimen other than nasopharyngeal swab, presence of viral mutation(s) within the areas targeted by this assay, and inadequate number of  viral copies (<131 copies/mL). A negative result must be combined with clinical observations, patient history, and epidemiological information. The expected result is Negative. Fact Sheet for Patients:  https://www.moore.com/ Fact Sheet for Healthcare Providers:  https://www.young.biz/ This test is not yet ap proved or cleared by the [place] FDA and  has been authorized for detection and/or diagnosis of SARS-CoV-2 by FDA under an Emergency Use Authorization (EUA). This EUA will remain  in effect (meaning this test can be used) for the duration of the COVID-19 declaration under Section 564(b)(1) of the Act, 21 U.S.C. section 360bbb-3(b)(1), unless the authorization is terminated or revoked sooner.    Influenza A by PCR NEGATIVE NEGATIVE   Influenza B by PCR NEGATIVE NEGATIVE    Comment: (NOTE) The Xpert Xpress SARS-CoV-2/FLU/RSV assay is intended as an aid in  the diagnosis of influenza from Nasopharyngeal swab specimens and  should not be used as a sole basis for treatment. Nasal washings and  aspirates are unacceptable for Xpert Xpress SARS-CoV-2/FLU/RSV  testing. Fact Sheet for Patients: https://www.moore.com/ Fact Sheet for Healthcare Providers: https://www.young.biz/ This test is not yet approved or cleared by the [place] FDA and  has been authorized for detection and/or diagnosis of SARS-CoV-2 by  FDA under an Emergency Use Authorization (EUA). This EUA will remain  in effect (meaning this test can be used) for the duration of the  Covid-19 declaration under Section 564(b)(1) of the Act, 21  U.S.C. section 360bbb-3(b)(1), unless the authorization is  terminated or revoked. Performed at [facility], [address], [place], [place] [address]   HIV Antibody (routine testing w rflx)     Status: None   Collection Time: 10/21/19 12:50 AM  Result Value Ref Range   HIV Screen 4th  Generation wRfx Non Reactive Non Reactive    Comment: Performed at [facility], [address], [place], [place] [address]  Brain natriuretic peptide     Status: None   Collection Time: 10/21/19 12:50 AM  Result Value Ref Range   B Natriuretic Peptide 49.3 0.0 - 100.0 pg/mL    Comment: Performed at [facility], [address] [place], [place] [address]  Hemoglobin A1c     Status: Abnormal   Collection Time: 10/21/19 12:50 AM  Result Value Ref Range   Hgb A1c MFr Bld 6.8 (H) 4.8 -  5.6 %    Comment: (NOTE) Pre diabetes:          5.7%-6.4% Diabetes:              >6.4% Glycemic control for   <7.0% adults with diabetes    Mean Plasma Glucose 148.46 mg/dL    Comment: Performed at [facility], [address], [place], [place] [address]  Basic metabolic panel     Status: Abnormal   Collection Time: 10/21/19  4:43 AM  Result Value Ref Range   Sodium 136 135 - 145 mmol/L   Potassium 4.2 3.5 - 5.1 mmol/L   Chloride 102 98 - 111 mmol/L   CO2 20 (L) 22 - 32 mmol/L   Glucose, Bld 138 (H) 70 - 99 mg/dL    Comment: Glucose reference range applies only to samples taken after fasting for at least 8 hours.   BUN 46 (H) 6 - 20 mg/dL   Creatinine, Ser 6.38 (H) 0.61 - 1.24 mg/dL   Calcium 9.4 8.9 - 45.3 mg/dL   GFR calc non Af Amer 21 (L) >60 mL/min   GFR calc Af Amer 24 (L) >60 mL/min   Anion gap 14 5 - 15    Comment: Performed at [facility], [address] [place], [place] [address]  CBC     Status: Abnormal   Collection Time: 10/21/19  4:43 AM  Result Value Ref Range   WBC 10.3 4.0 - 10.5 K/uL   RBC 4.75 4.22 - 5.81 MIL/uL   Hemoglobin 13.1 13.0 - 17.0 g/dL   HCT 32.1 22.4 - 82.5 %   MCV 87.6 80.0 - 100.0 fL   MCH 27.6 26.0 - 34.0 pg   MCHC 31.5 30.0 - 36.0 g/dL   RDW 00.3 70.4 - 88.8 %   Platelets 671 (H) 150 - 400 K/uL   nRBC 0.0 0.0 - 0.2 %    Comment: Performed at [facility], [address] [place], [place] [address]  Glucose, capillary      Status: Abnormal   Collection Time: 10/21/19  7:43 AM  Result Value Ref Range   Glucose-Capillary 140 (H) 70 - 99 mg/dL    Comment: Glucose reference range applies only to samples taken after fasting for at least 8 hours.  Glucose, capillary     Status: Abnormal   Collection Time: 10/21/19 11:50 AM  Result Value Ref Range   Glucose-Capillary 147 (H) 70 - 99 mg/dL    Comment: Glucose reference range applies only to samples taken after fasting for at least 8 hours.    DG Chest 2 View  Result Date: 10/21/2019 CLINICAL DATA:  49 year old male with renal failure. EXAM: CHEST - 2 VIEW COMPARISON:  Chest radiograph dated 01/26/2019. FINDINGS: Shallow inspiration. No focal consolidation, pleural effusion, pneumothorax. Mild cardiomegaly. No acute osseous pathology. IMPRESSION: 1. No active cardiopulmonary disease. 2. Mild cardiomegaly. Electronically Signed   By: Elgie Collard M.D.   On: 10/21/2019 00:45   MR KNEE RIGHT WO CONTRAST  Result Date: 10/20/2019 CLINICAL DATA:  Recent right knee trauma, inability to stand EXAM: MRI OF THE RIGHT KNEE WITHOUT CONTRAST TECHNIQUE: Multiplanar, multisequence MR imaging of the knee was performed. No intravenous contrast was administered. COMPARISON:  10/01/2019 FINDINGS: MENISCI Medial meniscus: Diffuse degenerative changes are noted, with maceration of the posterior horn and body of the medial meniscus. There is a focal tear of the undersurface of the medial meniscus at the junction of the body and  posterior horn. 1 cm parameniscal cyst along the posterior horn of the medial meniscus. Lateral meniscus:  No evidence of focal meniscal tear. LIGAMENTS Cruciates: Thickening of the ACL at the insertion on the tibia with intrasubstance increased T2 signal consistent with partial tear or chronic degenerative change. PCL is intact with normal signal. Collaterals: Medial and lateral collateral ligament complexes appear intact. CARTILAGE Patellofemoral: Irregularity and  thinning of the patellar cartilage at its apex and along the lateral patellar facet. Medial: Marked thinning of the articular cartilage in the medial compartment, more pronounced along the tibial aspect. Lateral: Mild articular cartilage thinning within the lateral compartment. Joint: There is a moderate suprapatellar joint effusion. Thickening of the plica is noted. There is abnormal edema within the Hoffa fat pad, with evidence of complete tear of the patellar tendon. Popliteal Fossa:  No Baker cyst. Intact popliteus tendon. Extensor Mechanism: There is a complete full-thickness tear of the patellar tendon with retraction. No evidence of avulsion fracture. The quadriceps tendon appears intact. Bones: No focal marrow signal abnormality. No fracture or dislocation. Other: There is diffuse subcutaneous edema most pronounced within the prepatellar and infrapatellar regions. IMPRESSION: 1. Full thickness full width tear of the patellar tendon with retraction. No evidence of patellar avulsion fracture. 2. Degenerative changes within the medial compartment of the knee, with associated meniscal tear. 3. Moderate joint effusion, with diffuse subcutaneous edema as above. 4. Thickening and intrasubstance edema within the ACL, consistent with partial tear or mucoid degeneration. Electronically Signed   By: Sharlet Salina M.D.   On: 10/20/2019 21:53   MR KNEE LEFT WO CONTRAST  Result Date: 10/20/2019 CLINICAL DATA:  Douglas [name], knee injury, abnormal quadriceps tendon on previous CT EXAM: MRI OF THE LEFT KNEE WITHOUT CONTRAST TECHNIQUE: Multiplanar, multisequence MR imaging of the knee was performed. No intravenous contrast was administered. COMPARISON:  10/01/2019 FINDINGS: MENISCI Medial meniscus:  Degenerative change without focal meniscal tear. Lateral meniscus: There may be a bucket-handle type tear of the lateral meniscus, with flipped fragment in the posteromedial aspect of the lateral compartment of the left knee.  LIGAMENTS Cruciates: Thickening of the ACL without evidence of tear, likely related to chronic degenerative change. PCL is intact with normal signal characteristics. Collaterals: Fibers are indistinct at the femoral attachment of the medial collateral ligament, consistent with partial tear or strain. LCL complex is intact. CARTILAGE Patellofemoral:  Mild chondromalacia at the patellar apex. Medial: Mild thinning of the articular cartilage in the medial compartment without focal defect. Lateral:  No chondral defect. Joint: There is a moderate suprapatellar joint effusion with plical thickening. Lateral subluxation of the patella within the trochlear groove. Likely disruption of the fibers of the medial patellar retinaculum at their femoral insertion. Popliteal Fossa:  No Baker cyst. Intact popliteus tendon. Extensor Mechanism: There is a full-thickness tear through the medial fibers of the quadriceps tendon mechanism. The lateral fibers appear intact. Greater than 50% of the with of the quadriceps tendon is disrupted. The patellar tendon is redundant and retracted but intact. Bones: No focal marrow signal abnormality. No fracture or dislocation. Other: None. IMPRESSION: 1. Full-thickness tear through the medial fibers of the quadriceps tendon mechanism. Greater than 50% of the with of the quadriceps tendon disrupted. 2. Lateral subluxation of the patella within the trochlear groove, with disruption of fibers at the femoral insertion of the medial patellar retinaculum. 3. Possible bucket-handle type tear of the lateral meniscus, with flipped fragment in the posteromedial aspect of the lateral compartment of the left knee.  4. Partial tear or strain of the femoral attachment of the medial collateral ligament. 5. Moderate suprapatellar joint effusion with plical thickening. 6. Thickening of the ACL without evidence of tear, likely related to chronic degenerative change. Electronically Signed   By: Randa Ngo M.D.    On: 10/20/2019 22:31   DG Abd 2 Views  Result Date: 10/21/2019 CLINICAL DATA:  Abdominal distention. EXAM: ABDOMEN - 2 VIEW COMPARISON:  10/16/2019. FINDINGS: Abdomen is incompletely imaged. Multiple distended loops of small and large bowel are noted. Bowel obstruction or adynamic ileus could present in this fashion. Follow-up exam suggested to demonstrate resolution. No free air identified. IMPRESSION: Multiple distended loops of small large bowel noted. Bowel obstruction or adynamic ileus could present this fashion. Follow-up exam suggested to demonstrate resolution. Electronically Signed   By: Marcello Moores  Register   On: 10/21/2019 10:39   DG Abd Portable 1V  Result Date: 10/21/2019 CLINICAL DATA:  Abdominal pain. Non bilious vomiting. EXAM: PORTABLE ABDOMEN - 1 VIEW COMPARISON:  None. FINDINGS: Mild gaseous distention of large and small bowel is present. There is some gaseous distention the stomach. Definite free air is present. No obstruction is evident. Gas is seen into the distal colon. Changes are present spine. IMPRESSION: Mild gaseous distention of large and small bowel without obstruction. This may represent ileus. Electronically Signed   By: San Morelle M.D.   On: 10/21/2019 04:17    Review of Systems  Gastrointestinal: Positive for abdominal distention, constipation, nausea and vomiting. Negative for abdominal pain.  Musculoskeletal: Positive for joint swelling.   Blood pressure (!) 126/98, pulse 98, temperature 97.7 F (36.5 C), temperature source Oral, resp. rate 18, height 6' (1.829 m), weight (!) 186 kg, SpO2 93 %. Physical Exam  Vitals reviewed. Constitutional: He is oriented to person, place, and time. He appears well-developed and well-nourished.  HENT:  Head: Normocephalic and atraumatic.  Right Ear: External ear normal.  Left Ear: External ear normal.  Mouth/Throat: Oropharynx is clear and moist.  Eyes: Pupils are equal, round, and reactive to light. No scleral  icterus.  Cardiovascular: Normal rate, regular rhythm and normal heart sounds.  Respiratory: Effort normal and breath sounds normal.  GI: He exhibits distension. Bowel sounds are decreased. There is no abdominal tenderness. No hernia.  Musculoskeletal:     Cervical back: Neck supple.  Neurological: He is alert and oriented to person, place, and time.  Skin: Skin is warm and dry. No rash noted.  Psychiatric: He has a normal mood and affect. His behavior is normal.    Assessment/Plan: Likely ileus -given clniical history, film and lack of surgery this is likely ileus -ct pending -needs to be mobilized, oob, ensure lytes are appropriate -ng tube for emesis   Rolm Bookbinder 10/21/2019, 12:46 PM

## 2019-10-22 ENCOUNTER — Inpatient Hospital Stay (HOSPITAL_COMMUNITY): Payer: Medicare Other

## 2019-10-22 DIAGNOSIS — R1110 Vomiting, unspecified: Secondary | ICD-10-CM

## 2019-10-22 DIAGNOSIS — R0602 Shortness of breath: Secondary | ICD-10-CM

## 2019-10-22 DIAGNOSIS — I4891 Unspecified atrial fibrillation: Secondary | ICD-10-CM

## 2019-10-22 DIAGNOSIS — R109 Unspecified abdominal pain: Secondary | ICD-10-CM

## 2019-10-22 DIAGNOSIS — N179 Acute kidney failure, unspecified: Secondary | ICD-10-CM | POA: Diagnosis not present

## 2019-10-22 LAB — RENAL FUNCTION PANEL
Albumin: 2.9 g/dL — ABNORMAL LOW (ref 3.5–5.0)
Anion gap: 23 — ABNORMAL HIGH (ref 5–15)
BUN: 69 mg/dL — ABNORMAL HIGH (ref 6–20)
CO2: 24 mmol/L (ref 22–32)
Calcium: 9.5 mg/dL (ref 8.9–10.3)
Chloride: 97 mmol/L — ABNORMAL LOW (ref 98–111)
Creatinine, Ser: 6.25 mg/dL — ABNORMAL HIGH (ref 0.61–1.24)
GFR calc Af Amer: 11 mL/min — ABNORMAL LOW (ref >60)
GFR calc non Af Amer: 10 mL/min — ABNORMAL LOW (ref >60)
Glucose, Bld: 118 mg/dL — ABNORMAL HIGH (ref 70–99)
Phosphorus: 6.7 mg/dL — ABNORMAL HIGH (ref 2.5–4.6)
Potassium: 4.2 mmol/L (ref 3.5–5.1)
Sodium: 144 mmol/L (ref 135–145)

## 2019-10-22 LAB — URINALYSIS, ROUTINE W REFLEX MICROSCOPIC
Glucose, UA: NEGATIVE mg/dL
Ketones, ur: 15 mg/dL — AB
Nitrite: POSITIVE — AB
Protein, ur: >300 mg/dL — AB
Specific Gravity, Urine: 1.025 (ref 1.005–1.030)
pH: 5 (ref 5.0–8.0)

## 2019-10-22 LAB — CBC
HCT: 39.9 % (ref 39.0–52.0)
Hemoglobin: 12.7 g/dL — ABNORMAL LOW (ref 13.0–17.0)
MCH: 27.6 pg (ref 26.0–34.0)
MCHC: 31.8 g/dL (ref 30.0–36.0)
MCV: 86.7 fL (ref 80.0–100.0)
Platelets: 617 10*3/uL — ABNORMAL HIGH (ref 150–400)
RBC: 4.6 MIL/uL (ref 4.22–5.81)
RDW: 14.5 % (ref 11.5–15.5)
WBC: 6.4 10*3/uL (ref 4.0–10.5)
nRBC: 0 % (ref 0.0–0.2)

## 2019-10-22 LAB — BASIC METABOLIC PANEL
Anion gap: 24 — ABNORMAL HIGH (ref 5–15)
BUN: 86 mg/dL — ABNORMAL HIGH (ref 6–20)
CO2: 29 mmol/L (ref 22–32)
Calcium: 9.3 mg/dL (ref 8.9–10.3)
Chloride: 94 mmol/L — ABNORMAL LOW (ref 98–111)
Creatinine, Ser: 8.26 mg/dL — ABNORMAL HIGH (ref 0.61–1.24)
GFR calc Af Amer: 8 mL/min — ABNORMAL LOW (ref >60)
GFR calc non Af Amer: 7 mL/min — ABNORMAL LOW (ref >60)
Glucose, Bld: 113 mg/dL — ABNORMAL HIGH (ref 70–99)
Potassium: 4 mmol/L (ref 3.5–5.1)
Sodium: 147 mmol/L — ABNORMAL HIGH (ref 135–145)

## 2019-10-22 LAB — URINALYSIS, MICROSCOPIC (REFLEX): WBC, UA: >50 WBC/hpf (ref 0–5)

## 2019-10-22 LAB — MRSA PCR SCREENING: MRSA by PCR: NEGATIVE

## 2019-10-22 LAB — SODIUM, URINE, RANDOM: Sodium, Ur: 56 mmol/L

## 2019-10-22 LAB — MAGNESIUM: Magnesium: 2 mg/dL (ref 1.7–2.4)

## 2019-10-22 LAB — GLUCOSE, CAPILLARY
Glucose-Capillary: 108 mg/dL — ABNORMAL HIGH (ref 70–99)
Glucose-Capillary: 118 mg/dL — ABNORMAL HIGH (ref 70–99)
Glucose-Capillary: 95 mg/dL (ref 70–99)
Glucose-Capillary: 103 mg/dL — ABNORMAL HIGH (ref 70–99)
Glucose-Capillary: 100 mg/dL — ABNORMAL HIGH (ref 70–99)

## 2019-10-22 LAB — ECHOCARDIOGRAM COMPLETE
Height: 72 in
Weight: 6560 oz

## 2019-10-22 LAB — LACTIC ACID, PLASMA: Lactic Acid, Venous: 1.7 mmol/L (ref 0.5–1.9)

## 2019-10-22 LAB — CREATININE, URINE, RANDOM: Creatinine, Urine: 115.71 mg/dL

## 2019-10-22 MED ORDER — SODIUM CHLORIDE 0.9 % IV SOLN: Freq: Once | INTRAVENOUS | Status: AC

## 2019-10-22 MED ORDER — PRISMASOL BGK 4/2.5 32-4-2.5 MEQ/L REPLACEMENT SOLN: Status: DC

## 2019-10-22 MED ORDER — METOPROLOL TARTRATE 5 MG/5ML IV SOLN
5.0000 mg | Freq: Three times a day (TID) | INTRAVENOUS | Status: DC
  Administered 2019-10-22: 5 mg via INTRAVENOUS
  Filled 2019-10-22 (×3): qty 5

## 2019-10-22 MED ORDER — HEPARIN SODIUM (PORCINE) 1000 UNIT/ML DIALYSIS
1000.0000 [IU] | INTRAMUSCULAR | Status: DC | PRN
  Administered 2019-10-26: 2400 [IU] via INTRAVENOUS_CENTRAL
  Filled 2019-10-22: qty 2
  Filled 2019-10-22 (×2): qty 6
  Filled 2019-10-22: qty 3

## 2019-10-22 MED ORDER — HEPARIN (PORCINE) 25000 UT/250ML-% IV SOLN
1500.0000 [IU]/h | INTRAVENOUS | Status: DC
  Administered 2019-10-22: 1500 [IU]/h via INTRAVENOUS
  Filled 2019-10-22: qty 250

## 2019-10-22 MED ORDER — LACTATED RINGERS IV BOLUS
500.0000 mL | Freq: Once | INTRAVENOUS | Status: AC
  Administered 2019-10-22: 500 mL via INTRAVENOUS

## 2019-10-22 MED ORDER — SODIUM CHLORIDE 0.9% FLUSH
10.0000 mL | Freq: Two times a day (BID) | INTRAVENOUS | Status: DC
  Administered 2019-10-23 – 2019-10-24 (×3): 10 mL
  Administered 2019-10-25: 40 mL
  Administered 2019-10-26 – 2019-10-28 (×3): 10 mL
  Administered 2019-10-28: 40 mL
  Administered 2019-10-29 – 2019-11-08 (×15): 10 mL

## 2019-10-22 MED ORDER — SODIUM CHLORIDE 0.9 % IV SOLN
1.0000 g | INTRAVENOUS | Status: AC
  Administered 2019-10-23 – 2019-10-29 (×8): 1 g via INTRAVENOUS
  Filled 2019-10-22: qty 1
  Filled 2019-10-22 (×2): qty 10
  Filled 2019-10-22: qty 1
  Filled 2019-10-22: qty 10
  Filled 2019-10-22 (×3): qty 1
  Filled 2019-10-22: qty 10

## 2019-10-22 MED ORDER — SODIUM CHLORIDE 0.9% FLUSH
10.0000 mL | INTRAVENOUS | Status: DC | PRN
  Administered 2019-10-28 (×2): 10 mL

## 2019-10-22 MED ORDER — ORAL CARE MOUTH RINSE
15.0000 mL | Freq: Two times a day (BID) | OROMUCOSAL | Status: DC
  Administered 2019-10-23 – 2019-11-07 (×16): 15 mL via OROMUCOSAL

## 2019-10-22 MED ORDER — PANTOPRAZOLE SODIUM 40 MG IV SOLR
40.0000 mg | INTRAVENOUS | Status: DC
  Administered 2019-10-23 – 2019-10-26 (×4): 40 mg via INTRAVENOUS
  Filled 2019-10-22 (×4): qty 40

## 2019-10-22 MED ORDER — HEPARIN SODIUM (PORCINE) 1000 UNIT/ML IJ SOLN
3000.0000 [IU] | Freq: Once | INTRAMUSCULAR | Status: AC
  Administered 2019-10-22: 3000 [IU] via INTRAVENOUS
  Filled 2019-10-22: qty 3

## 2019-10-22 MED ORDER — PRISMASOL BGK 4/2.5 32-4-2.5 MEQ/L IV SOLN: INTRAVENOUS | Status: DC

## 2019-10-22 MED ORDER — SODIUM CHLORIDE 0.9 % IV BOLUS
500.0000 mL | Freq: Once | INTRAVENOUS | Status: AC
  Administered 2019-10-22: 500 mL via INTRAVENOUS

## 2019-10-22 MED ORDER — HEPARIN (PORCINE) 25000 UT/250ML-% IV SOLN
2250.0000 [IU]/h | INTRAVENOUS | Status: AC
  Administered 2019-10-23: 2000 [IU]/h via INTRAVENOUS
  Administered 2019-10-23: 2400 [IU]/h via INTRAVENOUS
  Administered 2019-10-24: 2300 [IU]/h via INTRAVENOUS
  Administered 2019-10-24: 2400 [IU]/h via INTRAVENOUS
  Administered 2019-10-25 (×2): 2200 [IU]/h via INTRAVENOUS
  Administered 2019-10-26: 17:00:00 2150 [IU]/h via INTRAVENOUS
  Administered 2019-10-26: 2200 [IU]/h via INTRAVENOUS
  Administered 2019-10-27: 2150 [IU]/h via INTRAVENOUS
  Administered 2019-10-28 – 2019-10-30 (×5): 2300 [IU]/h via INTRAVENOUS
  Administered 2019-10-31: 2250 [IU]/h via INTRAVENOUS
  Administered 2019-10-31: 2300 [IU]/h via INTRAVENOUS
  Filled 2019-10-22 (×20): qty 250

## 2019-10-22 MED ORDER — HEPARIN (PORCINE) 2000 UNITS/L FOR CRRT
INTRAVENOUS_CENTRAL | Status: DC | PRN
  Filled 2019-10-22: qty 1000

## 2019-10-22 MED ORDER — POLYETHYLENE GLYCOL 3350 17 G PO PACK: 17.0000 g | PACK | Freq: Every day | ORAL | Status: DC | PRN

## 2019-10-22 MED ORDER — DOCUSATE SODIUM 100 MG PO CAPS: 100.0000 mg | ORAL_CAPSULE | Freq: Two times a day (BID) | ORAL | Status: DC | PRN

## 2019-10-22 MED ORDER — CHLORHEXIDINE GLUCONATE 0.12 % MT SOLN
15.0000 mL | Freq: Two times a day (BID) | OROMUCOSAL | Status: DC
  Administered 2019-10-23 – 2019-11-09 (×30): 15 mL via OROMUCOSAL
  Filled 2019-10-22 (×29): qty 15

## 2019-10-22 NOTE — Procedures (Signed)
Hemodialysis Catheter Insertion Procedure Note Giomar Gusler [facility] [id number] 10-Dec-1970  Procedure: Insertion of Hemodialysis Catheter Indications: Dialysis Access   Procedure Details Consent: Risks of procedure as well as the alternatives and risks of each were explained to the (patient/caregiver).  Consent for procedure obtained. Time Out: Verified patient identification, verified procedure, site/side was marked, verified correct patient position, special equipment/implants available, medications/allergies/relevent history reviewed, required imaging and test results available.  Performed  Maximum sterile technique was used including antiseptics, cap, gloves, gown, hand hygiene, mask and sheet. Skin prep: Chlorhexidine; local anesthetic administered Triple lumen hemodialysis catheter was inserted into left internal jugular vein using the Seldinger technique.  Evaluation Blood flow good Complications: No apparent complications Patient did tolerate procedure well. Chest X-ray ordered to verify placement.  CXR: normal.   Shelby Mattocks 10/22/2019 Simonne Martinet ACNP-BC [facility] Pulmonary/Critical Care Pager # [phone] OR # [phone] if no answer

## 2019-10-22 NOTE — Significant Event (Signed)
Transfer to [facility]

## 2019-10-22 NOTE — Progress Notes (Signed)
   10/22/19 1600  Assess: MEWS Score  BP 100/62  Pulse Rate (!) 113  ECG Heart Rate (!) 113  Resp (!) 24  SpO2 94 %  O2 Device Nasal Cannula  Patient Activity (if Appropriate) In bed  O2 Flow Rate (L/min) 2 L/min  Assess: MEWS Score  MEWS Temp 0  MEWS Systolic 1  MEWS Pulse 2  MEWS RR 1  MEWS LOC 0  MEWS Score 4  MEWS Score Color Red  Assess: if the MEWS score is Yellow or Red  Were vital signs taken at a resting state? Yes  Focused Assessment Documented focused assessment  Early Detection of Sepsis Score *See Row Information* Medium  MEWS guidelines implemented *See Row Information* No, other (Comment)  Treat  MEWS Interventions Escalated (See documentation below)  Take Vital Signs  Increase Vital Sign Frequency  Red: Q 1hr X 4 then Q 4hr X 4, if remains red, continue Q 4hrs  Escalate  MEWS: Escalate Red: discuss with charge nurse/RN and provider, consider discussing with RRT  Notify: Charge Nurse/RN  Name of Charge Nurse/RN Notified Ascencion Dike, RN  Date Charge Nurse/RN Notified 10/22/19  Time Charge Nurse/RN Notified 1607  Document  Patient Outcome Transferred/level of care increased (Transfer orders 2H)  Progress note created (see row info) Yes

## 2019-10-22 NOTE — Progress Notes (Signed)
Called report to [place], RN Unit 2C.  Pt transported to unit Rm 3 via bariatric bed.    CCMD notified of transfer.    Wife called and notified of transfer, given pt new unit and room number and name of nurse caring for her husband.

## 2019-10-22 NOTE — Progress Notes (Signed)
   10/22/19 0756  Assess: MEWS Score  Temp 99.6 F (37.6 C)  BP (!) 89/63  Pulse Rate (!) 112  ECG Heart Rate (!) 113  Resp 20  SpO2 91 %  O2 Device CPAP  Assess: MEWS Score  MEWS Temp 0  MEWS Systolic 1  MEWS Pulse 2  MEWS RR 0  MEWS LOC 0  MEWS Score 3  MEWS Score Color Yellow  Assess: if the MEWS score is Yellow or Red  Were vital signs taken at a resting state? Yes  Focused Assessment Documented focused assessment  Early Detection of Sepsis Score *See Row Information* Low  MEWS guidelines implemented *See Row Information* No, vital signs rechecked  Treat  MEWS Interventions Other (Comment) (MD at bedside)  Take Vital Signs  Increase Vital Sign Frequency  Yellow: Q 2hr X 2 then Q 4hr X 2, if remains yellow, continue Q 4hrs  Escalate  MEWS: Escalate Yellow: discuss with charge nurse/RN and consider discussing with provider and RRT  Notify: Charge Nurse/RN  Name of Charge Nurse/RN Notified Ascencion Dike, RN  Date Charge Nurse/RN Notified 10/22/19  Time Charge Nurse/RN Notified 0800  Document  Patient Outcome Other (Comment)  Progress note created (see row info) Yes

## 2019-10-22 NOTE — Progress Notes (Signed)
  Echocardiogram 2D Echocardiogram has been performed.  Douglas [name] 10/22/2019, 2:44 PM

## 2019-10-22 NOTE — Progress Notes (Addendum)
PROGRESS NOTE    Douglas [name]  XBJ:[id number] DOB: January 18, 1971 DOA: 10/20/2019 PCP: Toma Deiters, MD    Chief Complaint  Patient presents with  . Knee Pain    Brief Narrative:  49 year old gentleman history of CHF-diabetes, hypertension and gout came in for bilateral knee pain.  Patient reports he was seen in hospital for knee pain secondary to a mechanical fall.  Patient was unable to ambulate for almost 4 weeks due to knee pain.  He presents to [facility], ED and underwent MRIs of the bilateral knees.  The left knee shows full-thickness tear of the medial fibers of the quadriceps tendon and MRI of the right knee shows full-thickness tear of the patellar tendon.  Orthopedics consulted hand recommended vascular studies and probably surgical repair. Meanwhile patient reported worsening abdominal distention since 1 week, bowel movement 3 days ago and persistent nausea and vomiting. X-rays of the abdomen showed distended small bowel loops consistent with SBO versus ileus.  General surgery consulted and an NG tube was placed.  Patient was also found to have acute kidney injury probably secondary to dehydration and poor renal perfusion.  He was started on IV fluids and the CT abdomen pelvis ordered for further evaluation.  Nephrology consulted.  CT of the abdomen shows suggestive of a severe diffuse adynamic ileus given widespread small and large bowel dilatation with air-fluid levels. The possibility of a partial mechanical mid to distal small bowel obstruction is not excluded given the relatively collapsed distal small bowel. No free air.    Assessment & Plan:   Active Problems:   Diabetes mellitus without complication (HCC)   CHF (congestive heart failure) (HCC)   Gout   Hypertension   Sleep apnea   Atrial fibrillation (HCC)   Rupture of left quadriceps tendon   Rupture of patellar tendon, right, initial encounter   Acute kidney injury (HCC)   Knee pain   Bilateral knee  pain secondary to fall resulting in left quadriceps tendon tear and right patellar tendon tear Orthopedics on board and waiting for recommendations. Changed Eliquis to IV heparin. Pain control Venous duplex of the lower extremities is negative for DVT.     Acute kidney injury probably pre renal causes from poor renal perfusion from dehydration and diuretics and ARB, NSAID use.  Patient's baseline creatinine at 0.7 a week ago. CT of the abdomen and pelvis does not show any acute hydronephrosis.  Patient's urine output around 200-250 ml in the last 24 hours.  Bicarb is within normal limits. UA is pending, Urine sodium is 56 and urine creatinine is 115 Holding diuretics and ARB and other nephrotoxins. On admission creatinine is 2.52 worsened to 3.5 and two 6.25 today. Ck Levels wnl. BNP is  49 and wnl. Lactic acid is wnl.  Nephrology on board. Foley catheter placed yesterday. Repeat imaging of the kidneys to check for hydronephrosis and obstruction. Monitor strict intake and output.    Sleep Apnea On CPAP   Hypertension:  BP parameters are borderline.  MAP staying around 60s. PCCM consulted for further evaluation. Hold all anti hypertensive's at this time.    Acute Hypoxic respiratory failure Patient is currently on 1 L of nasal cannula oxygen and sats are around 95%.    Paroxysmal atrial fibrillation with rates in 110/min Rate control with metoprolol if blood pressure allows. On IV heparin for anti coagulation.  His CHA2DS2-VASc score is 3. His last echocardiogram shows left ventricular ejection fraction of 45%.    History  of chronic systolic heart failure Last EF around 45% Patient does not appear to be in fluid overload.    Diabetes mellitus CBG (last 3)  Recent Labs    10/21/19 2059 10/22/19 0734 10/22/19 1154  GLUCAP 113* 108* 118*   Resume sliding scale insulin for now.   Mild anemia of chronic disease. Hemoglobin at 12.7.    Abnormal UA with  moderate leukocytes and positive nitrites with many bacteria urine culture ordered and IV Rocephin added.  SBO/ Adnamic ileus:  On admission he has abdominal distention, nausea, vomiting .  NG tube placed and about 8.5 lit out in the last 24 hours.  General surgery consulted and recommendations given.  continue to monitor.    DVT prophylaxis: heparin sq Code Status: full code.  Family Communication: none at bedside. Will call wife to update.  Disposition:   Status is: Inpatient  Remains inpatient appropriate because:Hemodynamically unstable and Persistent severe electrolyte disturbances   Dispo: The patient is from: Home              Anticipated d/c is to: pending.               Anticipated d/c date is: > 3 days              Patient currently is not medically stable to d/c.        Consultants:   Nephrology  General surgery.    Procedures: ng TUBE PLACEMENT on 10/22/19.  Antimicrobials: rocephin for UTI.   Subjective: Reports feeling slightly better,   Objective: Vitals:   10/22/19 0846 10/22/19 1000 10/22/19 1200 10/22/19 1229  BP:   (!) 87/54 98/62  Pulse:   (!) 111   Resp:   (!) 28 20  Temp:   97.7 F (36.5 C)   TempSrc:   Oral   SpO2: (!) 89% 94% 94%   Weight:      Height:        Intake/Output Summary (Last 24 hours) at 10/22/2019 1252 Last data filed at 10/22/2019 1228 Gross per 24 hour  Intake 2838.09 ml  Output 16109 ml  Net -7571.91 ml   Filed Weights   10/20/19 1454 10/21/19 0120  Weight: (!) 194.1 kg (!) 186 kg    Examination:  General exam: alert, in mild discomfort. Dry mucous membranes.  Respiratory system: diminished at bases, no wheezing heard.  Cardiovascular system: S1-S2 heard, tachycardic, regular, no JVD, no pedal edema Gastrointestinal system: Abdomen is distended, mildly tender, hypoactive bowel sounds Central nervous system: Alert and oriented, grossly nonfocal Extremities: No pedal edema, cyanosis Skin: No rashes  seen Psychiatry: Mood is appropriate   Data Reviewed: I have personally reviewed following labs and imaging studies  CBC: Recent Labs  Lab 10/20/19 2037 10/21/19 0443 10/22/19 0220  WBC 9.7 10.3 6.4  NEUTROABS 7.5  --   --   HGB 13.7 13.1 12.7*  HCT 44.0 41.6 39.9  MCV 88.7 87.6 86.7  PLT 695* 671* 617*    Basic Metabolic Panel: Recent Labs  Lab 10/20/19 2037 10/21/19 0443 10/22/19 0220  NA 137 136 144  K 4.5 4.2 4.2  CL 100 102 97*  CO2 22 20* 24  GLUCOSE 124* 138* 118*  BUN 38* 46* 69*  CREATININE 2.52* 3.24* 6.25*  CALCIUM 9.8 9.4 9.5  MG  --   --  2.0  PHOS  --   --  6.7*    GFR: Estimated Creatinine Clearance: 24.2 mL/min (A) (by C-G formula based on  SCr of 6.25 mg/dL (H)).  Liver Function Tests: Recent Labs  Lab 10/20/19 2037 10/22/19 0220  AST 26  --   ALT 39  --   ALKPHOS 67  --   BILITOT 1.7*  --   PROT 7.9  --   ALBUMIN 3.3* 2.9*    CBG: Recent Labs  Lab 10/21/19 1150 10/21/19 1711 10/21/19 2059 10/22/19 0734 10/22/19 1154  GLUCAP 147* 132* 113* 108* 118*     Recent Results (from the past 240 hour(s))  Respiratory Panel by RT PCR (Flu A&B, Covid) - Nasopharyngeal Swab     Status: None   Collection Time: 10/20/19 10:37 PM   Specimen: Nasopharyngeal Swab  Result Value Ref Range Status   SARS Coronavirus 2 by RT PCR NEGATIVE NEGATIVE Final    Comment: (NOTE) SARS-CoV-2 target nucleic acids are NOT DETECTED. The SARS-CoV-2 RNA is generally detectable in upper respiratoy specimens during the acute phase of infection. The lowest concentration of SARS-CoV-2 viral copies this assay can detect is 131 copies/mL. A negative result does not preclude SARS-Cov-2 infection and should not be used as the sole basis for treatment or other patient management decisions. A negative result may occur with  improper specimen collection/handling, submission of specimen other than nasopharyngeal swab, presence of viral mutation(s) within the areas  targeted by this assay, and inadequate number of viral copies (<131 copies/mL). A negative result must be combined with clinical observations, patient history, and epidemiological information. The expected result is Negative. Fact Sheet for Patients:  https://www.moore.com/https://www.fda.gov/media/142436/download Fact Sheet for Healthcare Providers:  https://www.young.biz/https://www.fda.gov/media/142435/download This test is not yet ap proved or cleared by the [place] FDA and  has been authorized for detection and/or diagnosis of SARS-CoV-2 by FDA under an Emergency Use Authorization (EUA). This EUA will remain  in effect (meaning this test can be used) for the duration of the COVID-19 declaration under Section 564(b)(1) of the Act, 21 U.S.C. section 360bbb-3(b)(1), unless the authorization is terminated or revoked sooner.    Influenza A by PCR NEGATIVE NEGATIVE Final   Influenza B by PCR NEGATIVE NEGATIVE Final    Comment: (NOTE) The Xpert Xpress SARS-CoV-2/FLU/RSV assay is intended as an aid in  the diagnosis of influenza from Nasopharyngeal swab specimens and  should not be used as a sole basis for treatment. Nasal washings and  aspirates are unacceptable for Xpert Xpress SARS-CoV-2/FLU/RSV  testing. Fact Sheet for Patients: https://www.moore.com/https://www.fda.gov/media/142436/download Fact Sheet for Healthcare Providers: https://www.young.biz/https://www.fda.gov/media/142435/download This test is not yet approved or cleared by the [place] FDA and  has been authorized for detection and/or diagnosis of SARS-CoV-2 by  FDA under an Emergency Use Authorization (EUA). This EUA will remain  in effect (meaning this test can be used) for the duration of the  Covid-19 declaration under Section 564(b)(1) of the Act, 21  U.S.C. section 360bbb-3(b)(1), unless the authorization is  terminated or revoked. Performed at [facility], [address] [place], [place] [address]   Culture, blood (routine x 2)     Status: None (Preliminary result)    Collection Time: 10/21/19  9:38 PM   Specimen: BLOOD  Result Value Ref Range Status   Specimen Description BLOOD LEFT HAND  Final   Special Requests   Final    BOTTLES DRAWN AEROBIC AND ANAEROBIC Blood Culture adequate volume   Culture   Final    NO GROWTH < 12 HOURS Performed at [facility], [address] [place], [place] [address]    Report Status  PENDING  Incomplete  Culture, blood (routine x 2)     Status: None (Preliminary result)   Collection Time: 10/21/19  9:40 PM   Specimen: BLOOD  Result Value Ref Range Status   Specimen Description BLOOD RIGHT HAND  Final   Special Requests   Final    BOTTLES DRAWN AEROBIC AND ANAEROBIC Blood Culture adequate volume   Culture   Final    NO GROWTH < 12 HOURS Performed at [facility], [address], [place], [place] [address]    Report Status PENDING  Incomplete  MRSA PCR Screening     Status: None   Collection Time: 10/22/19  5:31 AM   Specimen: Nasopharyngeal  Result Value Ref Range Status   MRSA by PCR NEGATIVE NEGATIVE Final    Comment:        The GeneXpert MRSA Assay (FDA approved for NASAL specimens only), is one component of a comprehensive MRSA colonization surveillance program. It is not intended to diagnose MRSA infection nor to guide or monitor treatment for MRSA infections. Performed at [facility], [address] [place], [place] [address]          Radiology Studies: CT ABDOMEN PELVIS WO CONTRAST  Result Date: 10/21/2019 CLINICAL DATA:  Abdominal bloating and distension. Inpatient. EXAM: CT ABDOMEN AND PELVIS WITHOUT CONTRAST TECHNIQUE: Multidetector CT imaging of the abdomen and pelvis was performed following the standard protocol without IV contrast. COMPARISON:  Abdominal radiograph from earlier today. 06/27/2013 CT abdomen/pelvis. FINDINGS: Lower chest: Solid 1.3 cm right lower lobe pulmonary nodule (series 5/image 5), stable since 03/29/2019 chest CT. Circumferential mild wall  thickening in the lower thoracic esophagus. Hepatobiliary: Normal liver size. No liver mass. Normal gallbladder with no radiopaque cholelithiasis. No biliary ductal dilatation. Pancreas: Normal, with no mass or duct dilation. Spleen: Normal size. No mass. Adrenals/Urinary Tract: Normal adrenals. No renal stones. No hydronephrosis. No contour deforming renal masses. Bladder collapsed by indwelling Foley catheter. Stomach/Bowel: Stomach moderately distended with large air-fluid level. No gastric wall thickening or pneumatosis. Enteric tube terminates in the body of the stomach. There is moderate diffuse dilatation of the proximal to mid small bowel up to 5.5 cm diameter. Air-fluid levels are scattered throughout the dilated small bowel loops. Distal and terminal ileum are collapsed. No discrete small bowel caliber transition. No small bowel wall thickening or pneumatosis. Normal appendix. Moderate gaseous distention of the large bowel, most prominent in the right and transverse colon. Scattered small air-fluid levels in the large bowel. No large bowel wall thickening, diverticulosis or significant pericolonic fat stranding. Vascular/Lymphatic: Normal caliber abdominal aorta. No pathologically enlarged lymph nodes in the abdomen or pelvis. Reproductive: Normal size prostate with nonspecific internal prostatic calcification. Other: No pneumoperitoneum, ascites or focal fluid collection. Musculoskeletal: No aggressive appearing focal osseous lesions. Moderate thoracolumbar spondylosis. IMPRESSION: 1. CT findings are most suggestive of a severe diffuse adynamic ileus given widespread small and large bowel dilatation with air-fluid levels. The possibility of a partial mechanical mid to distal small bowel obstruction is not excluded given the relatively collapsed distal small bowel. No free air. No bowel wall thickening or pneumatosis. 2. Enteric tube terminates in the body of the stomach. 3. Circumferential mild wall  thickening in the lower thoracic esophagus, nonspecific, potentially due to reflux esophagitis, with Barrett's esophagus or neoplasm not excluded. Upper endoscopic correlation may be considered as clinically warranted. 4. Solid 1.3 cm right lower lobe pulmonary nodule, stable since 03/29/2019 chest CT. Please see the 03/29/2019 chest  CT report for commentary and follow-up recommendations. 5. Aortic Atherosclerosis (ICD10-I70.0). Electronically Signed   By: Delbert Phenix M.D.   On: 10/21/2019 18:41   DG Chest 2 View  Result Date: 10/21/2019 CLINICAL DATA:  49 year old male with renal failure. EXAM: CHEST - 2 VIEW COMPARISON:  Chest radiograph dated 01/26/2019. FINDINGS: Shallow inspiration. No focal consolidation, pleural effusion, pneumothorax. Mild cardiomegaly. No acute osseous pathology. IMPRESSION: 1. No active cardiopulmonary disease. 2. Mild cardiomegaly. Electronically Signed   By: Elgie Collard M.D.   On: 10/21/2019 00:45   DG Abd 1 View  Result Date: 10/21/2019 CLINICAL DATA:  NG tube placement EXAM: ABDOMEN - 1 VIEW COMPARISON:  Oct 21, 2019 FINDINGS: The NG tube appears to terminate over the stomach. The side hole may be at the level of the GE junction. Distended loops of small bowel are again noted. The stomach is distended. IMPRESSION: 1. Enteric tube tip projects over the gastric body. Consider advancing the tube further into the stomach as the side hole is likely at the level of the GE junction. 2. Distended stomach and loops of small bowel are noted in the upper abdomen. Electronically Signed   By: Katherine Mantle M.D.   On: 10/21/2019 15:53   DG Abd 1 View  Result Date: 10/21/2019 CLINICAL DATA:  NG tube placement EXAM: ABDOMEN - 1 VIEW COMPARISON:  None. FINDINGS: There is no nasogastric tube identified. There is gaseous distension of the small bowel and colon. There is no evidence of pneumoperitoneum, portal venous gas or pneumatosis. There are no pathologic calcifications along the  expected course of the ureters. The osseous structures are unremarkable. IMPRESSION: No nasogastric tube identified. Electronically Signed   By: Elige Ko   On: 10/21/2019 12:53   MR KNEE RIGHT WO CONTRAST  Result Date: 10/20/2019 CLINICAL DATA:  Recent right knee trauma, inability to stand EXAM: MRI OF THE RIGHT KNEE WITHOUT CONTRAST TECHNIQUE: Multiplanar, multisequence MR imaging of the knee was performed. No intravenous contrast was administered. COMPARISON:  10/01/2019 FINDINGS: MENISCI Medial meniscus: Diffuse degenerative changes are noted, with maceration of the posterior horn and body of the medial meniscus. There is a focal tear of the undersurface of the medial meniscus at the junction of the body and posterior horn. 1 cm parameniscal cyst along the posterior horn of the medial meniscus. Lateral meniscus:  No evidence of focal meniscal tear. LIGAMENTS Cruciates: Thickening of the ACL at the insertion on the tibia with intrasubstance increased T2 signal consistent with partial tear or chronic degenerative change. PCL is intact with normal signal. Collaterals: Medial and lateral collateral ligament complexes appear intact. CARTILAGE Patellofemoral: Irregularity and thinning of the patellar cartilage at its apex and along the lateral patellar facet. Medial: Marked thinning of the articular cartilage in the medial compartment, more pronounced along the tibial aspect. Lateral: Mild articular cartilage thinning within the lateral compartment. Joint: There is a moderate suprapatellar joint effusion. Thickening of the plica is noted. There is abnormal edema within the Hoffa fat pad, with evidence of complete tear of the patellar tendon. Popliteal Fossa:  No Baker cyst. Intact popliteus tendon. Extensor Mechanism: There is a complete full-thickness tear of the patellar tendon with retraction. No evidence of avulsion fracture. The quadriceps tendon appears intact. Bones: No focal marrow signal abnormality. No  fracture or dislocation. Other: There is diffuse subcutaneous edema most pronounced within the prepatellar and infrapatellar regions. IMPRESSION: 1. Full thickness full width tear of the patellar tendon with retraction. No evidence of patellar  avulsion fracture. 2. Degenerative changes within the medial compartment of the knee, with associated meniscal tear. 3. Moderate joint effusion, with diffuse subcutaneous edema as above. 4. Thickening and intrasubstance edema within the ACL, consistent with partial tear or mucoid degeneration. Electronically Signed   By: Sharlet Salina M.D.   On: 10/20/2019 21:53   MR KNEE LEFT WO CONTRAST  Result Date: 10/20/2019 CLINICAL DATA:  Larey Seat, knee injury, abnormal quadriceps tendon on previous CT EXAM: MRI OF THE LEFT KNEE WITHOUT CONTRAST TECHNIQUE: Multiplanar, multisequence MR imaging of the knee was performed. No intravenous contrast was administered. COMPARISON:  10/01/2019 FINDINGS: MENISCI Medial meniscus:  Degenerative change without focal meniscal tear. Lateral meniscus: There may be a bucket-handle type tear of the lateral meniscus, with flipped fragment in the posteromedial aspect of the lateral compartment of the left knee. LIGAMENTS Cruciates: Thickening of the ACL without evidence of tear, likely related to chronic degenerative change. PCL is intact with normal signal characteristics. Collaterals: Fibers are indistinct at the femoral attachment of the medial collateral ligament, consistent with partial tear or strain. LCL complex is intact. CARTILAGE Patellofemoral:  Mild chondromalacia at the patellar apex. Medial: Mild thinning of the articular cartilage in the medial compartment without focal defect. Lateral:  No chondral defect. Joint: There is a moderate suprapatellar joint effusion with plical thickening. Lateral subluxation of the patella within the trochlear groove. Likely disruption of the fibers of the medial patellar retinaculum at their femoral insertion.  Popliteal Fossa:  No Baker cyst. Intact popliteus tendon. Extensor Mechanism: There is a full-thickness tear through the medial fibers of the quadriceps tendon mechanism. The lateral fibers appear intact. Greater than 50% of the with of the quadriceps tendon is disrupted. The patellar tendon is redundant and retracted but intact. Bones: No focal marrow signal abnormality. No fracture or dislocation. Other: None. IMPRESSION: 1. Full-thickness tear through the medial fibers of the quadriceps tendon mechanism. Greater than 50% of the with of the quadriceps tendon disrupted. 2. Lateral subluxation of the patella within the trochlear groove, with disruption of fibers at the femoral insertion of the medial patellar retinaculum. 3. Possible bucket-handle type tear of the lateral meniscus, with flipped fragment in the posteromedial aspect of the lateral compartment of the left knee. 4. Partial tear or strain of the femoral attachment of the medial collateral ligament. 5. Moderate suprapatellar joint effusion with plical thickening. 6. Thickening of the ACL without evidence of tear, likely related to chronic degenerative change. Electronically Signed   By: Sharlet Salina M.D.   On: 10/20/2019 22:31   DG Abd 2 Views  Result Date: 10/21/2019 CLINICAL DATA:  Abdominal distention. EXAM: ABDOMEN - 2 VIEW COMPARISON:  10/16/2019. FINDINGS: Abdomen is incompletely imaged. Multiple distended loops of small and large bowel are noted. Bowel obstruction or adynamic ileus could present in this fashion. Follow-up exam suggested to demonstrate resolution. No free air identified. IMPRESSION: Multiple distended loops of small large bowel noted. Bowel obstruction or adynamic ileus could present this fashion. Follow-up exam suggested to demonstrate resolution. Electronically Signed   By: Maisie Fus  Register   On: 10/21/2019 10:39   DG Abd Portable 1V  Result Date: 10/21/2019 CLINICAL DATA:  Abdominal pain. Non bilious vomiting. EXAM:  PORTABLE ABDOMEN - 1 VIEW COMPARISON:  None. FINDINGS: Mild gaseous distention of large and small bowel is present. There is some gaseous distention the stomach. Definite free air is present. No obstruction is evident. Gas is seen into the distal colon. Changes are present spine. IMPRESSION: Mild  gaseous distention of large and small bowel without obstruction. This may represent ileus. Electronically Signed   By: Marin Roberts M.D.   On: 10/21/2019 04:17   VAS [place] LOWER EXTREMITY VENOUS (DVT)  Result Date: 10/21/2019  Lower Venous DVTStudy Indications: Edema.  Risk Factors: None identified. Limitations: Body habitus and poor ultrasound/tissue interface. Comparison Study: No prior studies. Performing Technologist: Chanda Busing RVT  Examination Guidelines: A complete evaluation includes B-mode imaging, spectral Doppler, color Doppler, and power Doppler as needed of all accessible portions of each vessel. Bilateral testing is considered an integral part of a complete examination. Limited examinations for reoccurring indications may be performed as noted. The reflux portion of the exam is performed with the patient in reverse Trendelenburg.  +---------+---------------+---------+-----------+----------+--------------+ RIGHT    CompressibilityPhasicitySpontaneityPropertiesThrombus Aging +---------+---------------+---------+-----------+----------+--------------+ CFV      Full           Yes      Yes                                 +---------+---------------+---------+-----------+----------+--------------+ SFJ      Full                                                        +---------+---------------+---------+-----------+----------+--------------+ FV Prox  Full                                                        +---------+---------------+---------+-----------+----------+--------------+ FV Mid   Full                                                         +---------+---------------+---------+-----------+----------+--------------+ FV DistalFull                                                        +---------+---------------+---------+-----------+----------+--------------+ PFV                                                   Not visualized +---------+---------------+---------+-----------+----------+--------------+ POP      Full           Yes      Yes                                 +---------+---------------+---------+-----------+----------+--------------+ PTV      Full                                                        +---------+---------------+---------+-----------+----------+--------------+  PERO     Full                                                        +---------+---------------+---------+-----------+----------+--------------+   +---------+---------------+---------+-----------+----------+--------------+ LEFT     CompressibilityPhasicitySpontaneityPropertiesThrombus Aging +---------+---------------+---------+-----------+----------+--------------+ CFV      Full           Yes      Yes                                 +---------+---------------+---------+-----------+----------+--------------+ SFJ      Full                                                        +---------+---------------+---------+-----------+----------+--------------+ FV Prox  Full                                                        +---------+---------------+---------+-----------+----------+--------------+ FV Mid   Full                                                        +---------+---------------+---------+-----------+----------+--------------+ FV DistalFull                                                        +---------+---------------+---------+-----------+----------+--------------+ PFV                                                   Not visualized  +---------+---------------+---------+-----------+----------+--------------+ POP      Full           Yes      Yes                                 +---------+---------------+---------+-----------+----------+--------------+ PTV      Full                                                        +---------+---------------+---------+-----------+----------+--------------+ PERO     Full                                                        +---------+---------------+---------+-----------+----------+--------------+  Summary: RIGHT: - There is no evidence of deep vein thrombosis in the lower extremity. However, portions of this examination were limited- see technologist comments above.  - No cystic structure found in the popliteal fossa.  LEFT: - There is no evidence of deep vein thrombosis in the lower extremity. However, portions of this examination were limited- see technologist comments above.  - No cystic structure found in the popliteal fossa.  *See table(s) above for measurements and observations. Electronically signed by Deitra Mayo MD on 10/21/2019 at 7:24:15 PM.    Final         Scheduled Meds: . Chlorhexidine Gluconate Cloth  6 each Topical Q0600  . insulin aspart  0-9 Units Subcutaneous TID WC  . metoprolol tartrate  5 mg Intravenous Q8H  . mometasone-formoterol  2 puff Inhalation BID  . pantoprazole (PROTONIX) IV  40 mg Intravenous Q24H  . polyethylene glycol  17 g Oral BID  . senna-docusate  1 tablet Oral BID  . sertraline  50 mg Oral Daily   Continuous Infusions: . sodium chloride 150 mL/hr at 10/22/19 1233  . heparin       LOS: 2 days        Hosie Poisson, MD [facility]   To contact the attending provider between 7A-7P or the covering provider during after hours 7P-7A, please log into the web site [email or web address] and access using universal [place] password for that web site. If you do not have the password, please call the hospital  operator.  10/22/2019, 12:52 PM

## 2019-10-22 NOTE — Progress Notes (Signed)
Patient ID: Douglas [name], male   DOB: [birth year], 49 y.o.   MRN: [id number] S: Pt developed tachypnea and tachycardia last night followed by emesis.  He was transferred to [facility] and an NGT was placed with over 8 liters of bilious material aspirated.  He is now on CPAP and resting comfortably.  Possible aspiration per respiratory notes.  He remains tachycardic and is now hypotensive. He denies any SOB but does have some nausea.  Feels a little better than yesterday. O:BP 98/62 (BP Location: Right Wrist)   Pulse (!) 111   Temp 97.7 F (36.5 C) (Oral)   Resp 20   Ht 6' (1.829 m)   Wt (!) 186 kg   SpO2 94%   BMI 55.61 kg/m   Intake/Output Summary (Last 24 hours) at 10/22/2019 1410 Last data filed at 10/22/2019 1228 Gross per 24 hour  Intake 2838.09 ml  Output 9410 ml  Net -6571.91 ml   Intake/Output: I/O last 3 completed shifts: In: 2838.1 [I.V.:1828.1; Other:10; IV Piggyback:1000] Out: 9010 [Urine:210; Emesis/NG output:8800]  Intake/Output this shift:  Total I/O In: -  Out: 1900 [Emesis/NG output:1900] Weight change:  Gen: obese AAM wearing CPAP in NAD CVS: tachy at 111, no rub Resp: diminished BS WUJ:WJXBJ, softer than yesterday, mildly tender Ext: no edema  Recent Labs  Lab 10/20/19 2037 10/21/19 0443 10/22/19 0220  NA 137 136 144  K 4.5 4.2 4.2  CL 100 102 97*  CO2 22 20* 24  GLUCOSE 124* 138* 118*  BUN 38* 46* 69*  CREATININE 2.52* 3.24* 6.25*  ALBUMIN 3.3*  --  2.9*  CALCIUM 9.8 9.4 9.5  PHOS  --   --  6.7*  AST 26  --   --   ALT 39  --   --    Liver Function Tests: Recent Labs  Lab 10/20/19 2037 10/22/19 0220  AST 26  --   ALT 39  --   ALKPHOS 67  --   BILITOT 1.7*  --   PROT 7.9  --   ALBUMIN 3.3* 2.9*   No results for input(s): LIPASE, AMYLASE in the last 168 hours. No results for input(s): AMMONIA in the last 168 hours. CBC: Recent Labs  Lab 10/20/19 2037 10/21/19 0443 10/22/19 0220  WBC 9.7 10.3 6.4  NEUTROABS 7.5  --   --   HGB 13.7  13.1 12.7*  HCT 44.0 41.6 39.9  MCV 88.7 87.6 86.7  PLT 695* 671* 617*   Cardiac Enzymes: Recent Labs  Lab 10/21/19 1113  CKTOTAL 344   CBG: Recent Labs  Lab 10/21/19 1150 10/21/19 1711 10/21/19 2059 10/22/19 0734 10/22/19 1154  GLUCAP 147* 132* 113* 108* 118*    Iron Studies: No results for input(s): IRON, TIBC, TRANSFERRIN, FERRITIN in the last 72 hours. Studies/Results: CT ABDOMEN PELVIS WO CONTRAST  Result Date: 10/21/2019 CLINICAL DATA:  Abdominal bloating and distension. Inpatient. EXAM: CT ABDOMEN AND PELVIS WITHOUT CONTRAST TECHNIQUE: Multidetector CT imaging of the abdomen and pelvis was performed following the standard protocol without IV contrast. COMPARISON:  Abdominal radiograph from earlier today. 06/27/2013 CT abdomen/pelvis. FINDINGS: Lower chest: Solid 1.3 cm right lower lobe pulmonary nodule (series 5/image 5), stable since 03/29/2019 chest CT. Circumferential mild wall thickening in the lower thoracic esophagus. Hepatobiliary: Normal liver size. No liver mass. Normal gallbladder with no radiopaque cholelithiasis. No biliary ductal dilatation. Pancreas: Normal, with no mass or duct dilation. Spleen: Normal size. No mass. Adrenals/Urinary Tract: Normal adrenals. No renal stones. No hydronephrosis. No contour  deforming renal masses. Bladder collapsed by indwelling Foley catheter. Stomach/Bowel: Stomach moderately distended with large air-fluid level. No gastric wall thickening or pneumatosis. Enteric tube terminates in the body of the stomach. There is moderate diffuse dilatation of the proximal to mid small bowel up to 5.5 cm diameter. Air-fluid levels are scattered throughout the dilated small bowel loops. Distal and terminal ileum are collapsed. No discrete small bowel caliber transition. No small bowel wall thickening or pneumatosis. Normal appendix. Moderate gaseous distention of the large bowel, most prominent in the right and transverse colon. Scattered small air-fluid  levels in the large bowel. No large bowel wall thickening, diverticulosis or significant pericolonic fat stranding. Vascular/Lymphatic: Normal caliber abdominal aorta. No pathologically enlarged lymph nodes in the abdomen or pelvis. Reproductive: Normal size prostate with nonspecific internal prostatic calcification. Other: No pneumoperitoneum, ascites or focal fluid collection. Musculoskeletal: No aggressive appearing focal osseous lesions. Moderate thoracolumbar spondylosis. IMPRESSION: 1. CT findings are most suggestive of a severe diffuse adynamic ileus given widespread small and large bowel dilatation with air-fluid levels. The possibility of a partial mechanical mid to distal small bowel obstruction is not excluded given the relatively collapsed distal small bowel. No free air. No bowel wall thickening or pneumatosis. 2. Enteric tube terminates in the body of the stomach. 3. Circumferential mild wall thickening in the lower thoracic esophagus, nonspecific, potentially due to reflux esophagitis, with Barrett's esophagus or neoplasm not excluded. Upper endoscopic correlation may be considered as clinically warranted. 4. Solid 1.3 cm right lower lobe pulmonary nodule, stable since 03/29/2019 chest CT. Please see the 03/29/2019 chest CT report for commentary and follow-up recommendations. 5. Aortic Atherosclerosis (ICD10-I70.0). Electronically Signed   By: Delbert Phenix M.D.   On: 10/21/2019 18:41   DG Chest 2 View  Result Date: 10/21/2019 CLINICAL DATA:  49 year old male with renal failure. EXAM: CHEST - 2 VIEW COMPARISON:  Chest radiograph dated 01/26/2019. FINDINGS: Shallow inspiration. No focal consolidation, pleural effusion, pneumothorax. Mild cardiomegaly. No acute osseous pathology. IMPRESSION: 1. No active cardiopulmonary disease. 2. Mild cardiomegaly. Electronically Signed   By: Elgie Collard M.D.   On: 10/21/2019 00:45   DG Abd 1 View  Result Date: 10/21/2019 CLINICAL DATA:  NG tube placement  EXAM: ABDOMEN - 1 VIEW COMPARISON:  Oct 21, 2019 FINDINGS: The NG tube appears to terminate over the stomach. The side hole may be at the level of the GE junction. Distended loops of small bowel are again noted. The stomach is distended. IMPRESSION: 1. Enteric tube tip projects over the gastric body. Consider advancing the tube further into the stomach as the side hole is likely at the level of the GE junction. 2. Distended stomach and loops of small bowel are noted in the upper abdomen. Electronically Signed   By: Katherine Mantle M.D.   On: 10/21/2019 15:53   DG Abd 1 View  Result Date: 10/21/2019 CLINICAL DATA:  NG tube placement EXAM: ABDOMEN - 1 VIEW COMPARISON:  None. FINDINGS: There is no nasogastric tube identified. There is gaseous distension of the small bowel and colon. There is no evidence of pneumoperitoneum, portal venous gas or pneumatosis. There are no pathologic calcifications along the expected course of the ureters. The osseous structures are unremarkable. IMPRESSION: No nasogastric tube identified. Electronically Signed   By: Elige Ko   On: 10/21/2019 12:53   MR KNEE RIGHT WO CONTRAST  Result Date: 10/20/2019 CLINICAL DATA:  Recent right knee trauma, inability to stand EXAM: MRI OF THE RIGHT KNEE WITHOUT CONTRAST  TECHNIQUE: Multiplanar, multisequence MR imaging of the knee was performed. No intravenous contrast was administered. COMPARISON:  10/01/2019 FINDINGS: MENISCI Medial meniscus: Diffuse degenerative changes are noted, with maceration of the posterior horn and body of the medial meniscus. There is a focal tear of the undersurface of the medial meniscus at the junction of the body and posterior horn. 1 cm parameniscal cyst along the posterior horn of the medial meniscus. Lateral meniscus:  No evidence of focal meniscal tear. LIGAMENTS Cruciates: Thickening of the ACL at the insertion on the tibia with intrasubstance increased T2 signal consistent with partial tear or chronic  degenerative change. PCL is intact with normal signal. Collaterals: Medial and lateral collateral ligament complexes appear intact. CARTILAGE Patellofemoral: Irregularity and thinning of the patellar cartilage at its apex and along the lateral patellar facet. Medial: Marked thinning of the articular cartilage in the medial compartment, more pronounced along the tibial aspect. Lateral: Mild articular cartilage thinning within the lateral compartment. Joint: There is a moderate suprapatellar joint effusion. Thickening of the plica is noted. There is abnormal edema within the Hoffa fat pad, with evidence of complete tear of the patellar tendon. Popliteal Fossa:  No Baker cyst. Intact popliteus tendon. Extensor Mechanism: There is a complete full-thickness tear of the patellar tendon with retraction. No evidence of avulsion fracture. The quadriceps tendon appears intact. Bones: No focal marrow signal abnormality. No fracture or dislocation. Other: There is diffuse subcutaneous edema most pronounced within the prepatellar and infrapatellar regions. IMPRESSION: 1. Full thickness full width tear of the patellar tendon with retraction. No evidence of patellar avulsion fracture. 2. Degenerative changes within the medial compartment of the knee, with associated meniscal tear. 3. Moderate joint effusion, with diffuse subcutaneous edema as above. 4. Thickening and intrasubstance edema within the ACL, consistent with partial tear or mucoid degeneration. Electronically Signed   By: Sharlet Salina M.D.   On: 10/20/2019 21:53   MR KNEE LEFT WO CONTRAST  Result Date: 10/20/2019 CLINICAL DATA:  Larey Seat, knee injury, abnormal quadriceps tendon on previous CT EXAM: MRI OF THE LEFT KNEE WITHOUT CONTRAST TECHNIQUE: Multiplanar, multisequence MR imaging of the knee was performed. No intravenous contrast was administered. COMPARISON:  10/01/2019 FINDINGS: MENISCI Medial meniscus:  Degenerative change without focal meniscal tear. Lateral  meniscus: There may be a bucket-handle type tear of the lateral meniscus, with flipped fragment in the posteromedial aspect of the lateral compartment of the left knee. LIGAMENTS Cruciates: Thickening of the ACL without evidence of tear, likely related to chronic degenerative change. PCL is intact with normal signal characteristics. Collaterals: Fibers are indistinct at the femoral attachment of the medial collateral ligament, consistent with partial tear or strain. LCL complex is intact. CARTILAGE Patellofemoral:  Mild chondromalacia at the patellar apex. Medial: Mild thinning of the articular cartilage in the medial compartment without focal defect. Lateral:  No chondral defect. Joint: There is a moderate suprapatellar joint effusion with plical thickening. Lateral subluxation of the patella within the trochlear groove. Likely disruption of the fibers of the medial patellar retinaculum at their femoral insertion. Popliteal Fossa:  No Baker cyst. Intact popliteus tendon. Extensor Mechanism: There is a full-thickness tear through the medial fibers of the quadriceps tendon mechanism. The lateral fibers appear intact. Greater than 50% of the with of the quadriceps tendon is disrupted. The patellar tendon is redundant and retracted but intact. Bones: No focal marrow signal abnormality. No fracture or dislocation. Other: None. IMPRESSION: 1. Full-thickness tear through the medial fibers of the quadriceps tendon mechanism.  Greater than 50% of the with of the quadriceps tendon disrupted. 2. Lateral subluxation of the patella within the trochlear groove, with disruption of fibers at the femoral insertion of the medial patellar retinaculum. 3. Possible bucket-handle type tear of the lateral meniscus, with flipped fragment in the posteromedial aspect of the lateral compartment of the left knee. 4. Partial tear or strain of the femoral attachment of the medial collateral ligament. 5. Moderate suprapatellar joint effusion with  plical thickening. 6. Thickening of the ACL without evidence of tear, likely related to chronic degenerative change. Electronically Signed   By: Sharlet SalinaMichael  Brown M.D.   On: 10/20/2019 22:31   DG Abd 2 Views  Result Date: 10/21/2019 CLINICAL DATA:  Abdominal distention. EXAM: ABDOMEN - 2 VIEW COMPARISON:  10/16/2019. FINDINGS: Abdomen is incompletely imaged. Multiple distended loops of small and large bowel are noted. Bowel obstruction or adynamic ileus could present in this fashion. Follow-up exam suggested to demonstrate resolution. No free air identified. IMPRESSION: Multiple distended loops of small large bowel noted. Bowel obstruction or adynamic ileus could present this fashion. Follow-up exam suggested to demonstrate resolution. Electronically Signed   By: Maisie Fushomas  Register   On: 10/21/2019 10:39   DG Abd Portable 1V  Result Date: 10/21/2019 CLINICAL DATA:  Abdominal pain. Non bilious vomiting. EXAM: PORTABLE ABDOMEN - 1 VIEW COMPARISON:  None. FINDINGS: Mild gaseous distention of large and small bowel is present. There is some gaseous distention the stomach. Definite free air is present. No obstruction is evident. Gas is seen into the distal colon. Changes are present spine. IMPRESSION: Mild gaseous distention of large and small bowel without obstruction. This may represent ileus. Electronically Signed   By: Marin Robertshristopher  Mattern M.D.   On: 10/21/2019 04:17   VAS US LOWER EXTREMITY VENOUS (DVT)  Result Date: 10/21/2019  Lower Venous DVTStudy Indications: Edema.  Risk Factors: None identified. Limitations: Body habitus and poor ultrasound/tissue interface. Comparison Study: No prior studies. Performing Technologist: Chanda BusingGregory Collins RVT  Examination Guidelines: A complete evaluation includes B-mode imaging, spectral Doppler, color Doppler, and power Doppler as needed of all accessible portions of each vessel. Bilateral testing is considered an integral part of a complete examination. Limited examinations  for reoccurring indications may be performed as noted. The reflux portion of the exam is performed with the patient in reverse Trendelenburg.  +---------+---------------+---------+-----------+----------+--------------+ RIGHT    CompressibilityPhasicitySpontaneityPropertiesThrombus Aging +---------+---------------+---------+-----------+----------+--------------+ CFV      Full           Yes      Yes                                 +---------+---------------+---------+-----------+----------+--------------+ SFJ      Full                                                        +---------+---------------+---------+-----------+----------+--------------+ FV Prox  Full                                                        +---------+---------------+---------+-----------+----------+--------------+ FV Mid   Full                                                        +---------+---------------+---------+-----------+----------+--------------+  FV DistalFull                                                        +---------+---------------+---------+-----------+----------+--------------+ PFV                                                   Not visualized +---------+---------------+---------+-----------+----------+--------------+ POP      Full           Yes      Yes                                 +---------+---------------+---------+-----------+----------+--------------+ PTV      Full                                                        +---------+---------------+---------+-----------+----------+--------------+ PERO     Full                                                        +---------+---------------+---------+-----------+----------+--------------+   +---------+---------------+---------+-----------+----------+--------------+ LEFT     CompressibilityPhasicitySpontaneityPropertiesThrombus Aging  +---------+---------------+---------+-----------+----------+--------------+ CFV      Full           Yes      Yes                                 +---------+---------------+---------+-----------+----------+--------------+ SFJ      Full                                                        +---------+---------------+---------+-----------+----------+--------------+ FV Prox  Full                                                        +---------+---------------+---------+-----------+----------+--------------+ FV Mid   Full                                                        +---------+---------------+---------+-----------+----------+--------------+ FV DistalFull                                                        +---------+---------------+---------+-----------+----------+--------------+  PFV                                                   Not visualized +---------+---------------+---------+-----------+----------+--------------+ POP      Full           Yes      Yes                                 +---------+---------------+---------+-----------+----------+--------------+ PTV      Full                                                        +---------+---------------+---------+-----------+----------+--------------+ PERO     Full                                                        +---------+---------------+---------+-----------+----------+--------------+     Summary: RIGHT: - There is no evidence of deep vein thrombosis in the lower extremity. However, portions of this examination were limited- see technologist comments above.  - No cystic structure found in the popliteal fossa.  LEFT: - There is no evidence of deep vein thrombosis in the lower extremity. However, portions of this examination were limited- see technologist comments above.  - No cystic structure found in the popliteal fossa.  *See table(s) above for measurements and observations.  Electronically signed by Waverly Ferrari MD on 10/21/2019 at 7:24:15 PM.    Final    . Chlorhexidine Gluconate Cloth  6 each Topical Q0600  . insulin aspart  0-9 Units Subcutaneous TID WC  . metoprolol tartrate  5 mg Intravenous Q8H  . mometasone-formoterol  2 puff Inhalation BID  . pantoprazole (PROTONIX) IV  40 mg Intravenous Q24H  . polyethylene glycol  17 g Oral BID  . senna-docusate  1 tablet Oral BID  . sertraline  50 mg Oral Daily    BMET    Component Value Date/Time   NA 144 10/22/2019 0220   K 4.2 10/22/2019 0220   CL 97 (L) 10/22/2019 0220   CO2 24 10/22/2019 0220   GLUCOSE 118 (H) 10/22/2019 0220   BUN 69 (H) 10/22/2019 0220   CREATININE 6.25 (H) 10/22/2019 0220   CALCIUM 9.5 10/22/2019 0220   GFRNONAA 10 (L) 10/22/2019 0220   GFRAA 11 (L) 10/22/2019 0220   CBC    Component Value Date/Time   WBC 6.4 10/22/2019 0220   RBC 4.60 10/22/2019 0220   HGB 12.7 (L) 10/22/2019 0220   HCT 39.9 10/22/2019 0220   PLT 617 (H) 10/22/2019 0220   MCV 86.7 10/22/2019 0220   MCH 27.6 10/22/2019 0220   MCHC 31.8 10/22/2019 0220   RDW 14.5 10/22/2019 0220   LYMPHSABS 1.0 10/20/2019 2037   MONOABS 1.1 (H) 10/20/2019 2037   EOSABS 0.0 10/20/2019 2037   BASOSABS 0.0 10/20/2019 2037   Assessment/Plan: 1.  AKI- presumably due to ischemic ATN in setting of hypotension, volume depletion, acute illness, as well as NSAID use  and concomitant ARB therapy.  Also concerning for bladder outlet obstruction as he tells me he had a foley catheter that was removed before he was discharged yesterday from [facility]  CT scan of abdomen and pelvis are pending.  Will order bladder scan as well and he may require foley catheter insertion.  His serum creatinine was normal a week ago. 1. Continue to hold ARB and lasix 2. Avoid all NSAIDs and COX-II I's 3. CT of abdomen without evidence of obstruction 4. Foley catheter in place 5. Progressive AKI, oliguric likely due to worsening ischemic ATN in  setting of large amounts of NG suction (almost 10 liters) and ongoing hypotension 6. No emergent indication for dialysis but if he continues to deteriorate would recommend transferring to ICU for CRRT.  PCCM consult today may help facilitate transfer if it were to occur suddenly later today. 2. Acute hypoxic respiratory distress- possible aspiration and now on CPAP.  PCCM consult as above. 3. Bilateral knee injuries with tendon damage as well as menisci.  Ortho evaluating but injury occurred 4 weeks ago and his super morbid obesity, AKI, and ileus vs. SBO will delay intervention. 4. N/V/abdominal distension- abd series with dilated loops of bowel, possible ileus vs. SBO.  Surgery consulted and CT scan supports severe ileus.   1. S/p NGT with large amounts of output (over 10 liters in 24 hours) 2. Starting to pass flatus 3. Continue NGT 5. Atrial fibrillation 6. HTN- hold BP meds now that he is hypotensive. 7. Morbid obesity 8. DM- per primary 9. Chronic combined systolic and diastolic CHF- stable for now. 10. Debility- pt unable to stand or walk due to obesity and knee injuries.  Donetta Potts, MD [employer] [phone]

## 2019-10-22 NOTE — Progress Notes (Signed)
   10/22/19 1400  Vitals  BP (!) 86/69  MAP (mmHg) 76  Pulse Rate (!) 115  ECG Heart Rate (!) 115  Resp (!) 22  Oxygen Therapy  SpO2 92 %  O2 Device Nasal Cannula  O2 Flow Rate (L/min) 2 L/min  Patient Activity (if Appropriate) In bed  Pulse Oximetry Type Continuous  MEWS Score  MEWS Temp 0  MEWS Systolic 1  MEWS Pulse 2  MEWS RR 1  MEWS LOC 0  MEWS Score 4  MEWS Score Color Red  Provider Notification  Provider Name/Title Dr. Elige Radon (MD at bedside, rounding)  Date Provider Notified 10/22/19  Time Provider Notified 1410  Notification Type Rounds  Notification Reason Other (Comment) (no urine output, hypotension,increase HR,RR)  Response Other (Comment) (Patient being transferred to [facility])  Date of Provider Response 10/22/19  Time of Provider Response 1410 (MD at beside)  Note  Observations  (Patient waiting on bed in 2H)

## 2019-10-22 NOTE — Consult Note (Signed)
NAME:  Douglas [name], MRN:  [id number], DOB:  1971/06/16, LOS: 2 ADMISSION DATE:  10/20/2019, CONSULTATION DATE: 10/22/2019 REFERRING MD: Dr. Blake Divine, Dr. Abel Presto, CHIEF COMPLAINT:  Renal failure, SOB   Brief History   49 year old male, acute renal failure, oliguria, I see admission for CVVHD and hypotension.  History of present illness   This is a 49 year old gentleman history of diabetes hypertension came to the hospital after mechanical fall which occurred several weeks ago.  Patient was found to have acute renal failure.  And concern for SBO versus ileus.  NG tube was placed.  A rapid response occurred the night of 10/21/2019.  He had a significant amount of NG output.  He was also given 1 L fluid bolus.  Patient was seen by surgery for ileus.  Recommending continued NG tube decompression.  Echocardiogram was completed today.  Renal ultrasound pending.  Patient had worsening kidney function, initial serum creatinine of 2.5 up to 3.24.  Now serum creatinine 6.25 with a serum bicarb of 24.  Nephrology was consulted for evaluation.  Due to low blood pressures and very little urine output PCCM was consulted for recommendations transferred to the intensive care unit for close observation and likely initiation of CVVHD.  Patient does have history of paroxysmal atrial fibrillation.  Was on Eliquis PTA.  Switch to heparin infusion.  Past Medical History   Past Medical History:  Diagnosis Date  . CHF (congestive heart failure) (HCC)   . Degenerative arthritis   . Diabetes mellitus without complication (HCC)   . Gout      Significant Hospital Events   None   Consults:  Pulmonary critical care Nephrology Pharmacy Surgery  Procedures:  none  Significant Diagnostic Tests:  Previous echocardiogram EF 45%  10/22/2019 echo: Pending Renal ultrasound 10/22/2019: Pending  Micro Data:  10/20/2019: Covid negative influenza PCR negative  Antimicrobials:  Ceftriaxone  Interim  history/subjective:  Per hpi   Objective   Blood pressure 98/62, pulse (!) 111, temperature 97.7 F (36.5 C), temperature source Oral, resp. rate 20, height 6' (1.829 m), weight (!) 186 kg, SpO2 94 %.        Intake/Output Summary (Last 24 hours) at 10/22/2019 1454 Last data filed at 10/22/2019 1228 Gross per 24 hour  Intake 2838.09 ml  Output 9410 ml  Net -6571.91 ml   Filed Weights   10/20/19 1454 10/21/19 0120  Weight: (!) 194.1 kg (!) 186 kg    Examination: General: Morbidly obese male, resting comfortably in bed, low blood pressure HENT: Tracking appropriately, NCAT Lungs: Clear to auscultation bilaterally, diminished in the bases, poor inspiratory effort Cardiovascular: Regular rate rhythm, S1-S2 Abdomen: Morbidly obese pannus, mildly distended, tympanic to percussion Extremities: No significant lower extremity edema Neuro: Alert oriented following commands no focal deficit GU: Deferred  Resolved Hospital Problem list     Assessment & Plan:   Acute renal failure Unclear etiology, likely multifactorial related to dehydration poor renal perfusion, diuretics, ARB and previous NSAID use. Plan: We appreciate nephrology input. Transfer to the intensive care unit pending repeat BMP. 4pm per nephro With low systolic blood pressures nephrology believes will not tolerate HD. We will need HD catheter as well as initiation of CVVHD per nephrology. Foley in place to monitor I's and O's Renal ultrasound pending. If renal function is worsening we will need to hold heparin for placement of dialysis catheter. Transfer orders placed for movement to the intensive care unit.  Morbid obesity, sleep apnea, likely obesity hypoventilation syndrome. BiPAP  as needed  Ileus NG tube to suction Conservative management  Appreciate surgery input  Hypertension Holding home BP medications If needed will start vasoactive meds. Map at this point remains above 65 however systolics are in the  high 80s and low 90s.  Paroxysmal atrial fibrillation Rate controlled at this time Continue metoprolol as blood pressure allows. Remains on IV heparin for full dose anticoagulation.  History of chronic systolic heart failure Plan: Repeat echocardiogram pending  Type 2 diabetes, hyperglycemia Continue SSI with CBGs.  Normocytic, chronic anemia. Continue to follow Conservative transfusion threshold. Observe for any signs of bleeding.  Possible sepsis Patient with abnormal UA on admission with leukocytes and nitrites plus bacteria Possible urinary tract infection Urine culture pending Blood cultures no growth to date Plan: Continue Rocephin.  Positive D-dimer History of recent trauma from fall. Vascular duplex lower extremity negative for DVT.   Best practice:  Diet: N.p.o. Pain/Anxiety/Delirium protocol (if indicated): Not applicable VAP protocol (if indicated): Not applicable DVT prophylaxis: Full dose heparin infusion GI prophylaxis: None Glucose control: SSI Mobility: BR Code Status: FULL Family Communication: updated patient  Disposition: ICU transfer   Labs   CBC: Recent Labs  Lab 10/20/19 2037 10/21/19 0443 10/22/19 0220  WBC 9.7 10.3 6.4  NEUTROABS 7.5  --   --   HGB 13.7 13.1 12.7*  HCT 44.0 41.6 39.9  MCV 88.7 87.6 86.7  PLT 695* 671* 617*    Basic Metabolic Panel: Recent Labs  Lab 10/20/19 2037 10/21/19 0443 10/22/19 0220  NA 137 136 144  K 4.5 4.2 4.2  CL 100 102 97*  CO2 22 20* 24  GLUCOSE 124* 138* 118*  BUN 38* 46* 69*  CREATININE 2.52* 3.24* 6.25*  CALCIUM 9.8 9.4 9.5  MG  --   --  2.0  PHOS  --   --  6.7*   GFR: Estimated Creatinine Clearance: 24.2 mL/min (A) (by C-G formula based on SCr of 6.25 mg/dL (H)). Recent Labs  Lab 10/20/19 2037 10/21/19 0443 10/21/19 2132 10/22/19 0220  WBC 9.7 10.3  --  6.4  LATICACIDVEN  --   --  1.5 1.7    Liver Function Tests: Recent Labs  Lab 10/20/19 2037 10/22/19 0220  AST 26   --   ALT 39  --   ALKPHOS 67  --   BILITOT 1.7*  --   PROT 7.9  --   ALBUMIN 3.3* 2.9*   No results for input(s): LIPASE, AMYLASE in the last 168 hours. No results for input(s): AMMONIA in the last 168 hours.  ABG No results found for: PHART, PCO2ART, PO2ART, HCO3, TCO2, ACIDBASEDEF, O2SAT   Coagulation Profile: No results for input(s): INR, PROTIME in the last 168 hours.  Cardiac Enzymes: Recent Labs  Lab 10/21/19 1113  CKTOTAL 344    HbA1C: Hgb A1c MFr Bld  Date/Time Value Ref Range Status  10/21/2019 12:50 AM 6.8 (H) 4.8 - 5.6 % Final    Comment:    (NOTE) Pre diabetes:          5.7%-6.4% Diabetes:              >6.4% Glycemic control for   <7.0% adults with diabetes     CBG: Recent Labs  Lab 10/21/19 1150 10/21/19 1711 10/21/19 2059 10/22/19 0734 10/22/19 1154  GLUCAP 147* 132* 113* 108* 118*    Review of Systems:   Review of Systems  Constitutional: Negative for chills, fever, malaise/fatigue and weight loss.  HENT: Negative for hearing  loss, sore throat and tinnitus.   Eyes: Negative for blurred vision and double vision.  Respiratory: Negative for cough, hemoptysis, sputum production, shortness of breath, wheezing and stridor.   Cardiovascular: Negative for chest pain, palpitations, orthopnea, leg swelling and PND.  Gastrointestinal: Positive for abdominal pain, nausea and vomiting. Negative for constipation, diarrhea and heartburn.  Genitourinary: Negative for dysuria, hematuria and urgency.  Musculoskeletal: Positive for back pain and myalgias. Negative for joint pain.  Skin: Negative for itching and rash.  Neurological: Negative for dizziness, tingling, weakness and headaches.  Endo/Heme/Allergies: Negative for environmental allergies. Does not bruise/bleed easily.  Psychiatric/Behavioral: Negative for depression. The patient is not nervous/anxious and does not have insomnia.   All other systems reviewed and are negative.    Past Medical History   He,  has a past medical history of CHF (congestive heart failure) (HCC), Degenerative arthritis, Diabetes mellitus without complication (HCC), and Gout.   Surgical History   History reviewed. No pertinent surgical history.   Social History   reports that he quit smoking about 5 years ago. He has never used smokeless tobacco. He reports previous alcohol use. He reports that he does not use drugs.   Family History   His family history is not on file.   Allergies Allergies  Allergen Reactions  . Benazepril Anaphylaxis  . Betadine [Povidone Iodine]     rash  . Prednisone Other (See Comments)    Headache.     Home Medications  Prior to Admission medications   Medication Sig Start Date End Date Taking? Authorizing Provider  allopurinol (ZYLOPRIM) 300 MG tablet Take 300 mg by mouth daily. 09/14/19  Yes [provider]  atorvastatin (LIPITOR) 20 MG tablet Take 1 tablet by mouth daily.   Yes [provider]  diltiazem (CARDIZEM CD) 360 MG 24 hr capsule Take 360 mg by mouth daily. 07/18/19  Yes [provider]  ELIQUIS 5 MG TABS tablet Take 5 mg by mouth 2 (two) times daily. 08/17/19  Yes [provider]  fluticasone (FLONASE) 50 MCG/ACT nasal spray Place 1 spray into both nostrils daily as needed. 08/24/19  Yes [provider]  Fluticasone-Salmeterol (ADVAIR) 250-50 MCG/DOSE AEPB Inhale 1 puff into the lungs 2 (two) times daily as needed for shortness of breath or wheezing. 07/18/19  Yes [provider]  furosemide (LASIX) 40 MG tablet Take 40 mg by mouth daily. 02/03/19 02/03/20 Yes [provider]  HM FEXOFENADINE HCL 180 MG tablet Take 180 mg by mouth daily as needed. 07/18/19  Yes [provider]  hydrALAZINE (APRESOLINE) 50 MG tablet Take 50 mg by mouth 2 (two) times daily. 07/18/19  Yes [provider]  losartan (COZAAR) 100 MG tablet Take 100 mg by mouth daily. 09/14/19  Yes [provider]  metFORMIN  (GLUCOPHAGE) 1000 MG tablet Take 1,000 mg by mouth daily. 09/14/19  Yes [provider]  metoprolol succinate (TOPROL-XL) 50 MG 24 hr tablet Take 100 mg by mouth daily. 08/03/19  Yes [provider]  montelukast (SINGULAIR) 10 MG tablet Take 10 mg by mouth daily as needed. 07/18/19  Yes [provider]  Omega-3 1000 MG CAPS Take 1 capsule by mouth daily.   Yes [provider]  omeprazole (PRILOSEC) 20 MG capsule Take 20 mg by mouth daily. 09/14/19  Yes [provider]  POTASSIUM PO Take 1 tablet by mouth daily.   Yes [provider]  sertraline (ZOLOFT) 50 MG tablet Take 50 mg by mouth daily. 07/18/19  Yes [provider]     This patient is critically ill with multiple organ system failure; which, requires frequent high complexity decision making, assessment, support, evaluation, and titration of therapies. This was completed through the application of advanced monitoring technologies and extensive interpretation of multiple databases. During this encounter critical care time was devoted to patient care services described in this note for 36 minutes.  Josephine Igo, DO [facility] 10/22/2019 3:10 PM

## 2019-10-22 NOTE — Progress Notes (Signed)
ANTICOAGULATION CONSULT NOTE - Initial Consult  Pharmacy Consult for heparin Indication: afib  Allergies  Allergen Reactions  . Benazepril Anaphylaxis  . Betadine [Povidone Iodine]     rash  . Prednisone Other (See Comments)    Headache.    Patient Measurements: Height: 6' (182.9 cm) Weight: (bed scale error/unable to weight properly) IBW/kg (Calculated) : 77.6 Heparin Dosing Weight: 123kg  Vital Signs: Temp: 99.6 F (37.6 C) (05/08 0756) Temp Source: Axillary (05/08 0756) BP: 101/63 (05/08 0800) Pulse Rate: 111 (05/08 0800)  Labs: Recent Labs    10/20/19 2037 10/20/19 2037 10/21/19 0443 10/21/19 1113 10/22/19 0220  HGB 13.7   < > 13.1  --  12.7*  HCT 44.0  --  41.6  --  39.9  PLT 695*  --  671*  --  617*  CREATININE 2.52*  --  3.24*  --  6.25*  CKTOTAL  --   --   --  344  --    < > = values in this interval not displayed.    Estimated Creatinine Clearance: 24.2 mL/min (A) (by C-G formula based on SCr of 6.25 mg/dL (H)).   Medical History: Past Medical History:  Diagnosis Date  . CHF (congestive heart failure) ([place])   . Degenerative arthritis   . Diabetes mellitus without complication ([place])   . Gout     Medications:  Medications Prior to Admission  Medication Sig Dispense Refill Last Dose  . allopurinol (ZYLOPRIM) 300 MG tablet Take 300 mg by mouth daily.   10/20/2019 at Unknown time  . atorvastatin (LIPITOR) 20 MG tablet Take 1 tablet by mouth daily.   10/20/2019 at Unknown time  . diltiazem (CARDIZEM CD) 360 MG 24 hr capsule Take 360 mg by mouth daily.   10/20/2019 at Unknown time  . ELIQUIS 5 MG TABS tablet Take 5 mg by mouth 2 (two) times daily.   10/20/2019 at 0900  . fluticasone (FLONASE) 50 MCG/ACT nasal spray Place 1 spray into both nostrils daily as needed.   10/20/2019 at Unknown time  . Fluticasone-Salmeterol (ADVAIR) 250-50 MCG/DOSE AEPB Inhale 1 puff into the lungs 2 (two) times daily as needed for shortness of breath or wheezing.   10/20/2019 at  Unknown time  . furosemide (LASIX) 40 MG tablet Take 40 mg by mouth daily.   10/20/2019 at Unknown time  . HM FEXOFENADINE HCL 180 MG tablet Take 180 mg by mouth daily as needed.   10/20/2019 at Unknown time  . hydrALAZINE (APRESOLINE) 50 MG tablet Take 50 mg by mouth 2 (two) times daily.   10/20/2019 at Unknown time  . losartan (COZAAR) 100 MG tablet Take 100 mg by mouth daily.   10/20/2019 at Unknown time  . metFORMIN (GLUCOPHAGE) 1000 MG tablet Take 1,000 mg by mouth daily.   10/20/2019 at Unknown time  . metoprolol succinate (TOPROL-XL) 50 MG 24 hr tablet Take 100 mg by mouth daily.   10/20/2019 at 0900  . montelukast (SINGULAIR) 10 MG tablet Take 10 mg by mouth daily as needed.   10/20/2019 at Unknown time  . Omega-3 1000 MG CAPS Take 1 capsule by mouth daily.   10/20/2019 at Unknown time  . omeprazole (PRILOSEC) 20 MG capsule Take 20 mg by mouth daily.   10/20/2019 at Unknown time  . POTASSIUM PO Take 1 tablet by mouth daily.   10/20/2019 at Unknown time  . sertraline (ZOLOFT) 50 MG tablet Take 50 mg by mouth daily.   10/20/2019 at Unknown time  Assessment: 49 yo male on apixaban PTA for afib (also at high risk for DVT). He is now noted with AKI and ilesu. Pharmacy consulted to dose heparin  -(last apixaban given 5/7 pm)  -hg= 12.7 -SCr= 6.25 (was 0.7 in 08/2019)  Goal of Therapy:  Heparin level 0.3-0.7 units/ml aPTT 66-102 seconds Monitor platelets by anticoagulation protocol: Yes   Plan:  -start heparin at 1500 units/hr -aPTT and heparin level in 8 hours -Daily aPTT, heparin level and CBC  Harland German, PharmD Clinical Pharmacist **Pharmacist phone directory can now be found on [email or web address] ([email or web address]).  Listed under [facility].

## 2019-10-22 NOTE — Progress Notes (Signed)
   10/22/19 1200  Assess: MEWS Score  Temp 97.7 F (36.5 C)  BP (!) 87/54  Pulse Rate (!) 111  ECG Heart Rate (!) 112  Resp (!) 28  SpO2 94 %  O2 Device Nasal Cannula  O2 Flow Rate (L/min) 1 L/min  Assess: MEWS Score  MEWS Temp 0  MEWS Systolic 1  MEWS Pulse 2  MEWS RR 2  MEWS LOC 0  MEWS Score 5  MEWS Score Color Red  Assess: if the MEWS score is Yellow or Red  Were vital signs taken at a resting state? Yes  Focused Assessment Documented focused assessment  Early Detection of Sepsis Score *See Row Information* Low  MEWS guidelines implemented *See Row Information* Yes  Treat  MEWS Interventions Other (Comment) (MD V. Akula aware new orders obtained)  Take Vital Signs  Increase Vital Sign Frequency  Red: Q 1hr X 4 then Q 4hr X 4, if remains red, continue Q 4hrs  Escalate  MEWS: Escalate Red: discuss with charge nurse/RN and provider, consider discussing with RRT  Notify: Charge Nurse/RN  Name of Charge Nurse/RN Notified Bernadette Rochello  Date Charge Nurse/RN Notified 10/22/19  Time Charge Nurse/RN Notified 1214  Notify: Provider  Provider Name/Title Dr. Blake Divine  Date Provider Notified 10/22/19  Time Provider Notified 1207  Notification Type Page  Notification Reason Other (Comment) (continued low BP, pt meds on hold due to NPO status)  Response See new orders  Date of Provider Response 10/22/19  Time of Provider Response 1221  Document  Patient Outcome Other (Comment) (New orders implemented, fluids increased)  Progress note created (see row info) Yes

## 2019-10-22 NOTE — Progress Notes (Signed)
Progress Note: General Surgery Service   Chief Complaint/Subjective: Abdomen less painful, large amount fluid out from NG overnight, + flatus this morning  Objective: Vital signs in last 24 hours: Temp:  [97.6 F (36.4 C)-100.1 F (37.8 C)] 99.6 F (37.6 C) (05/08 0756) Pulse Rate:  [86-124] 112 (05/08 0756) Resp:  [17-29] 20 (05/08 0756) BP: (82-130)/(44-72) 89/63 (05/08 0756) SpO2:  [90 %-99 %] 91 % (05/08 0756) Last BM Date: 10/21/19  Intake/Output from previous day: 05/07 0701 - 05/08 0700 In: 2838.1 [I.V.:1828.1; IV Piggyback:1000] Out: 8510 [Urine:210; Emesis/NG output:8300] Intake/Output this shift: No intake/output data recorded.  Gen: NAD  Resp: CPAP in place, nonlabored  Card: tachycardic  Abd: soft, with obesity, nontender  Lab Results: CBC  Recent Labs    10/21/19 0443 10/22/19 0220  WBC 10.3 6.4  HGB 13.1 12.7*  HCT 41.6 39.9  PLT 671* 617*   BMET Recent Labs    10/21/19 0443 10/22/19 0220  NA 136 144  K 4.2 4.2  CL 102 97*  CO2 20* 24  GLUCOSE 138* 118*  BUN 46* 69*  CREATININE 3.24* 6.25*  CALCIUM 9.4 9.5   PT/INR No results for input(s): LABPROT, INR in the last 72 hours. ABG No results for input(s): PHART, HCO3 in the last 72 hours.  Invalid input(s): PCO2, PO2  Anti-infectives: Anti-infectives (From admission, onward)   None      Medications: Scheduled Meds: . allopurinol  300 mg Oral Daily  . apixaban  5 mg Oral BID  . atorvastatin  20 mg Oral Daily  . Chlorhexidine Gluconate Cloth  6 each Topical Q0600  . diltiazem  360 mg Oral Daily  . insulin aspart  0-9 Units Subcutaneous TID WC  . metoprolol succinate  50 mg Oral Daily  . mometasone-formoterol  2 puff Inhalation BID  . montelukast  10 mg Oral QHS  . pantoprazole  40 mg Oral Daily  . polyethylene glycol  17 g Oral BID  . senna-docusate  1 tablet Oral BID  . sertraline  50 mg Oral Daily   Continuous Infusions: . sodium chloride 100 mL/hr at 10/22/19 0710    PRN Meds:.acetaminophen **OR** acetaminophen, ondansetron (ZOFRAN) IV, promethazine, simethicone  Assessment/Plan: 49 yo male with ileus after long hospitalization for knee injury. CT reviewed showing dilation of small intestine and colon without focal transition point. -continue NG tube today, hopeful it will not have to be in very long -hold off on parenteral nutrition currently -unlikely to require surgery   LOS: 2 days   Rodman Pickle, MD [phone] [facility]

## 2019-10-22 NOTE — Progress Notes (Signed)
   10/22/19 1500  Assess: MEWS Score  BP (!) 96/59  Pulse Rate (!) 111  ECG Heart Rate (!) 112  Resp (!) 26  SpO2 95 %  O2 Device Nasal Cannula  Patient Activity (if Appropriate) In bed  O2 Flow Rate (L/min) 2 L/min  Assess: MEWS Score  MEWS Temp 0  MEWS Systolic 1  MEWS Pulse 2  MEWS RR 2  MEWS LOC 0  MEWS Score 5  MEWS Score Color Red  Assess: if the MEWS score is Yellow or Red  Were vital signs taken at a resting state? Yes  Focused Assessment Documented focused assessment  Early Detection of Sepsis Score *See Row Information* Low  MEWS guidelines implemented *See Row Information* No, previously red, continue vital signs every 4 hours  Treat  MEWS Interventions Escalated (See documentation below) (patient transferring to 2H)  Take Vital Signs  Increase Vital Sign Frequency  Red: Q 1hr X 4 then Q 4hr X 4, if remains red, continue Q 4hrs  Escalate  MEWS: Escalate Red: discuss with charge nurse/RN and provider, consider discussing with RRT  Notify: Charge Nurse/RN  Name of Charge Nurse/RN Notified Ascencion Dike, RN  Date Charge Nurse/RN Notified 10/22/19  Time Charge Nurse/RN Notified 1503  Document  Patient Outcome Other (Comment) ([place] orders for [id number])  Progress note created (see row info) Yes

## 2019-10-22 NOTE — Progress Notes (Signed)
Patient transferred to [facility]. Wife and daughter updated at bedside and waiting in [facility] waiting area.

## 2019-10-23 DIAGNOSIS — K567 Ileus, unspecified: Secondary | ICD-10-CM | POA: Diagnosis not present

## 2019-10-23 DIAGNOSIS — R579 Shock, unspecified: Secondary | ICD-10-CM | POA: Diagnosis not present

## 2019-10-23 DIAGNOSIS — N179 Acute kidney failure, unspecified: Secondary | ICD-10-CM

## 2019-10-23 LAB — GLUCOSE, CAPILLARY
Glucose-Capillary: 128 mg/dL — ABNORMAL HIGH (ref 70–99)
Glucose-Capillary: 101 mg/dL — ABNORMAL HIGH (ref 70–99)
Glucose-Capillary: 96 mg/dL (ref 70–99)
Glucose-Capillary: 105 mg/dL — ABNORMAL HIGH (ref 70–99)

## 2019-10-23 LAB — CBC WITH DIFFERENTIAL/PLATELET
Abs Immature Granulocytes: 0.03 10*3/uL (ref 0.00–0.07)
Basophils Absolute: 0.1 10*3/uL (ref 0.0–0.1)
Basophils Relative: 1 %
Eosinophils Absolute: 0 10*3/uL (ref 0.0–0.5)
Eosinophils Relative: 0 %
HCT: 41.5 % (ref 39.0–52.0)
Hemoglobin: 13.2 g/dL (ref 13.0–17.0)
Immature Granulocytes: 0 %
Lymphocytes Relative: 16 %
Lymphs Abs: 1.6 10*3/uL (ref 0.7–4.0)
MCH: 28.3 pg (ref 26.0–34.0)
MCHC: 31.8 g/dL (ref 30.0–36.0)
MCV: 88.9 fL (ref 80.0–100.0)
Monocytes Absolute: 1.9 10*3/uL — ABNORMAL HIGH (ref 0.1–1.0)
Monocytes Relative: 19 %
Neutro Abs: 6.6 10*3/uL (ref 1.7–7.7)
Neutrophils Relative %: 64 %
Platelets: 648 10*3/uL — ABNORMAL HIGH (ref 150–400)
RBC: 4.67 MIL/uL (ref 4.22–5.81)
RDW: 14.6 % (ref 11.5–15.5)
WBC Morphology: INCREASED
WBC: 10.2 10*3/uL (ref 4.0–10.5)
nRBC: 0 % (ref 0.0–0.2)

## 2019-10-23 LAB — COMPREHENSIVE METABOLIC PANEL
ALT: 37 U/L (ref 0–44)
AST: 25 U/L (ref 15–41)
Albumin: 3 g/dL — ABNORMAL LOW (ref 3.5–5.0)
Alkaline Phosphatase: 66 U/L (ref 38–126)
Anion gap: 21 — ABNORMAL HIGH (ref 5–15)
BUN: 70 mg/dL — ABNORMAL HIGH (ref 6–20)
CO2: 30 mmol/L (ref 22–32)
Calcium: 9.3 mg/dL (ref 8.9–10.3)
Chloride: 94 mmol/L — ABNORMAL LOW (ref 98–111)
Creatinine, Ser: 6.97 mg/dL — ABNORMAL HIGH (ref 0.61–1.24)
GFR calc Af Amer: 10 mL/min — ABNORMAL LOW (ref >60)
GFR calc non Af Amer: 8 mL/min — ABNORMAL LOW (ref >60)
Glucose, Bld: 115 mg/dL — ABNORMAL HIGH (ref 70–99)
Potassium: 4.2 mmol/L (ref 3.5–5.1)
Sodium: 145 mmol/L (ref 135–145)
Total Bilirubin: 1.2 mg/dL (ref 0.3–1.2)
Total Protein: 8.6 g/dL — ABNORMAL HIGH (ref 6.5–8.1)

## 2019-10-23 LAB — HEPARIN LEVEL (UNFRACTIONATED): Heparin Unfractionated: 1.24 IU/mL — ABNORMAL HIGH (ref 0.30–0.70)

## 2019-10-23 LAB — APTT
aPTT: 37 seconds — ABNORMAL HIGH (ref 24–36)
aPTT: 50 seconds — ABNORMAL HIGH (ref 24–36)
aPTT: 78 seconds — ABNORMAL HIGH (ref 24–36)

## 2019-10-23 LAB — RENAL FUNCTION PANEL
Albumin: 2.9 g/dL — ABNORMAL LOW (ref 3.5–5.0)
Anion gap: 20 — ABNORMAL HIGH (ref 5–15)
BUN: 53 mg/dL — ABNORMAL HIGH (ref 6–20)
CO2: 28 mmol/L (ref 22–32)
Calcium: 9.1 mg/dL (ref 8.9–10.3)
Chloride: 96 mmol/L — ABNORMAL LOW (ref 98–111)
Creatinine, Ser: 5.63 mg/dL — ABNORMAL HIGH (ref 0.61–1.24)
GFR calc Af Amer: 13 mL/min — ABNORMAL LOW (ref >60)
GFR calc non Af Amer: 11 mL/min — ABNORMAL LOW (ref >60)
Glucose, Bld: 114 mg/dL — ABNORMAL HIGH (ref 70–99)
Phosphorus: 4.8 mg/dL — ABNORMAL HIGH (ref 2.5–4.6)
Potassium: 3.8 mmol/L (ref 3.5–5.1)
Sodium: 144 mmol/L (ref 135–145)

## 2019-10-23 LAB — PHOSPHORUS: Phosphorus: 6 mg/dL — ABNORMAL HIGH (ref 2.5–4.6)

## 2019-10-23 LAB — MAGNESIUM: Magnesium: 2.5 mg/dL — ABNORMAL HIGH (ref 1.7–2.4)

## 2019-10-23 LAB — PROCALCITONIN: Procalcitonin: 2.48 ng/mL

## 2019-10-23 MED ORDER — METOCLOPRAMIDE HCL 5 MG/ML IJ SOLN
10.0000 mg | Freq: Four times a day (QID) | INTRAMUSCULAR | Status: AC
  Administered 2019-10-23 – 2019-10-24 (×8): 10 mg via INTRAVENOUS
  Filled 2019-10-23 (×8): qty 2

## 2019-10-23 MED ORDER — NOREPINEPHRINE 4 MG/250ML-% IV SOLN
0.0000 ug/min | INTRAVENOUS | Status: DC
  Administered 2019-10-23: 2 ug/min via INTRAVENOUS
  Filled 2019-10-23 (×2): qty 250

## 2019-10-23 MED ORDER — NOREPINEPHRINE 16 MG/250ML-% IV SOLN
0.0000 ug/min | INTRAVENOUS | Status: DC
  Administered 2019-10-23: 16 ug/min via INTRAVENOUS
  Administered 2019-10-24: 13.013 ug/min via INTRAVENOUS
  Filled 2019-10-23 (×3): qty 250

## 2019-10-23 NOTE — Progress Notes (Signed)
Patient ID: Douglas [name], male   DOB: Apr 20, 1971, 49 y.o.   MRN: [id number] S: Started on CRRT yesterday due to worsening, oliguric, aki and respiratory distress.  He feels much better today.   O:BP 110/76   Pulse (!) 104   Temp 99 F (37.2 C) (Oral)   Resp (!) 22   Ht 6' (1.829 m)   Wt (!) 188.2 kg   SpO2 95%   BMI 56.28 kg/m   Intake/Output Summary (Last 24 hours) at 10/23/2019 1031 Last data filed at 10/23/2019 1000 Gross per 24 hour  Intake 4700.25 ml  Output 8331 ml  Net -3630.75 ml   Intake/Output: I/O last 3 completed shifts: In: 43 [I.V.:4233; Other:10; IV Piggyback:1600] Out: 50093 [Urine:20; Emesis/NG output:12200; Other:623]  Intake/Output this shift:  Total I/O In: 592 [I.V.:592] Out: 298 [Emesis/NG output:300] Weight change:  Gen: obese AAM lying in bed in NAD CVS: tachy at 104 Resp: diminished BS Abd: obese, distended, soft, NT Ext: trace presacral edema  Recent Labs  Lab 10/20/19 2037 10/21/19 0443 10/22/19 0220 10/22/19 1610 10/23/19 0321  NA 137 136 144 147* 145  K 4.5 4.2 4.2 4.0 4.2  CL 100 102 97* 94* 94*  CO2 22 20* 24 29 30   GLUCOSE 124* 138* 118* 113* 115*  BUN 38* 46* 69* 86* 70*  CREATININE 2.52* 3.24* 6.25* 8.26* 6.97*  ALBUMIN 3.3*  --  2.9*  --  3.0*  CALCIUM 9.8 9.4 9.5 9.3 9.3  PHOS  --   --  6.7*  --  6.0*  AST 26  --   --   --  25  ALT 39  --   --   --  37   Liver Function Tests: Recent Labs  Lab 10/20/19 2037 10/22/19 0220 10/23/19 0321  AST 26  --  25  ALT 39  --  37  ALKPHOS 67  --  66  BILITOT 1.7*  --  1.2  PROT 7.9  --  8.6*  ALBUMIN 3.3* 2.9* 3.0*   No results for input(s): LIPASE, AMYLASE in the last 168 hours. No results for input(s): AMMONIA in the last 168 hours. CBC: Recent Labs  Lab 10/20/19 2037 10/20/19 2037 10/21/19 0443 10/22/19 0220 10/23/19 0321  WBC 9.7   < > 10.3 6.4 10.2  NEUTROABS 7.5  --   --   --  6.6  HGB 13.7   < > 13.1 12.7* 13.2  HCT 44.0   < > 41.6 39.9 41.5  MCV 88.7   --  87.6 86.7 88.9  PLT 695*   < > 671* 617* 648*   < > = values in this interval not displayed.   Cardiac Enzymes: Recent Labs  Lab 10/21/19 1113  CKTOTAL 344   CBG: Recent Labs  Lab 10/22/19 1154 10/22/19 1827 10/22/19 1918 10/22/19 2315 10/23/19 0644  GLUCAP 118* 95 103* 100* 128*    Iron Studies: No results for input(s): IRON, TIBC, TRANSFERRIN, FERRITIN in the last 72 hours. Studies/Results: CT ABDOMEN PELVIS WO CONTRAST  Result Date: 10/21/2019 CLINICAL DATA:  Abdominal bloating and distension. Inpatient. EXAM: CT ABDOMEN AND PELVIS WITHOUT CONTRAST TECHNIQUE: Multidetector CT imaging of the abdomen and pelvis was performed following the standard protocol without IV contrast. COMPARISON:  Abdominal radiograph from earlier today. 06/27/2013 CT abdomen/pelvis. FINDINGS: Lower chest: Solid 1.3 cm right lower lobe pulmonary nodule (series 5/image 5), stable since 03/29/2019 chest CT. Circumferential mild wall thickening in the lower thoracic esophagus. Hepatobiliary: Normal liver size. No  liver mass. Normal gallbladder with no radiopaque cholelithiasis. No biliary ductal dilatation. Pancreas: Normal, with no mass or duct dilation. Spleen: Normal size. No mass. Adrenals/Urinary Tract: Normal adrenals. No renal stones. No hydronephrosis. No contour deforming renal masses. Bladder collapsed by indwelling Foley catheter. Stomach/Bowel: Stomach moderately distended with large air-fluid level. No gastric wall thickening or pneumatosis. Enteric tube terminates in the body of the stomach. There is moderate diffuse dilatation of the proximal to mid small bowel up to 5.5 cm diameter. Air-fluid levels are scattered throughout the dilated small bowel loops. Distal and terminal ileum are collapsed. No discrete small bowel caliber transition. No small bowel wall thickening or pneumatosis. Normal appendix. Moderate gaseous distention of the large bowel, most prominent in the right and transverse colon.  Scattered small air-fluid levels in the large bowel. No large bowel wall thickening, diverticulosis or significant pericolonic fat stranding. Vascular/Lymphatic: Normal caliber abdominal aorta. No pathologically enlarged lymph nodes in the abdomen or pelvis. Reproductive: Normal size prostate with nonspecific internal prostatic calcification. Other: No pneumoperitoneum, ascites or focal fluid collection. Musculoskeletal: No aggressive appearing focal osseous lesions. Moderate thoracolumbar spondylosis. IMPRESSION: 1. CT findings are most suggestive of a severe diffuse adynamic ileus given widespread small and large bowel dilatation with air-fluid levels. The possibility of a partial mechanical mid to distal small bowel obstruction is not excluded given the relatively collapsed distal small bowel. No free air. No bowel wall thickening or pneumatosis. 2. Enteric tube terminates in the body of the stomach. 3. Circumferential mild wall thickening in the lower thoracic esophagus, nonspecific, potentially due to reflux esophagitis, with Barrett's esophagus or neoplasm not excluded. Upper endoscopic correlation may be considered as clinically warranted. 4. Solid 1.3 cm right lower lobe pulmonary nodule, stable since 03/29/2019 chest CT. Please see the 03/29/2019 chest CT report for commentary and follow-up recommendations. 5. Aortic Atherosclerosis (ICD10-I70.0). Electronically Signed   By: Delbert Phenix M.D.   On: 10/21/2019 18:41   DG Abd 1 View  Result Date: 10/21/2019 CLINICAL DATA:  NG tube placement EXAM: ABDOMEN - 1 VIEW COMPARISON:  Oct 21, 2019 FINDINGS: The NG tube appears to terminate over the stomach. The side hole may be at the level of the GE junction. Distended loops of small bowel are again noted. The stomach is distended. IMPRESSION: 1. Enteric tube tip projects over the gastric body. Consider advancing the tube further into the stomach as the side hole is likely at the level of the GE junction. 2.  Distended stomach and loops of small bowel are noted in the upper abdomen. Electronically Signed   By: Katherine Mantle M.D.   On: 10/21/2019 15:53   DG Abd 1 View  Result Date: 10/21/2019 CLINICAL DATA:  NG tube placement EXAM: ABDOMEN - 1 VIEW COMPARISON:  None. FINDINGS: There is no nasogastric tube identified. There is gaseous distension of the small bowel and colon. There is no evidence of pneumoperitoneum, portal venous gas or pneumatosis. There are no pathologic calcifications along the expected course of the ureters. The osseous structures are unremarkable. IMPRESSION: No nasogastric tube identified. Electronically Signed   By: Elige Ko   On: 10/21/2019 12:53   US RENAL  Result Date: 10/22/2019 CLINICAL DATA:  Acute kidney injury EXAM: RENAL / URINARY TRACT ULTRASOUND COMPLETE COMPARISON:  Oct 21, 2019 FINDINGS: Right Kidney: Renal measurements: 9.2 x 3.9 x 5.1 cm = volume: 95 mL . Echogenicity within normal limits. No mass or hydronephrosis visualized. Left Kidney: Renal measurements: 8.1 x 5.5 x 5 =  volume: 116 mL. Echogenicity within normal limits. No mass or hydronephrosis visualized. Bladder: The bladder is decompressed with a Foley catheter which limits evaluation. Other: Overall evaluation of the kidneys was limited by patient body habitus. IMPRESSION: 1. Habitus limited study. 2. No definite acute abnormality given the above limitation. No evidence for hydronephrosis. 3. Bladder decompressed with a Foley catheter which limits evaluation. Electronically Signed   By: Katherine Mantle M.D.   On: 10/22/2019 16:48   DG Chest Port 1 View  Result Date: 10/22/2019 CLINICAL DATA:  Encounter for central line placement. EXAM: PORTABLE CHEST 1 VIEW COMPARISON:  Oct 21, 2019 FINDINGS: There is a newly placed non tunneled dialysis catheter on the right. The tip terminates over the cavoatrial junction. The enteric tube tip is not visualized on this study. The lung volumes are low. The heart size is  enlarged. There is vascular congestion with pulmonary edema. There is no pneumothorax. IMPRESSION: 1. Well-positioned right-sided non tunneled dialysis catheter. No pneumothorax. 2. Cardiomegaly with persistent pulmonary edema. 3. Low lung volumes. Electronically Signed   By: Katherine Mantle M.D.   On: 10/22/2019 18:04   DG Abd 2 Views  Result Date: 10/21/2019 CLINICAL DATA:  Abdominal distention. EXAM: ABDOMEN - 2 VIEW COMPARISON:  10/16/2019. FINDINGS: Abdomen is incompletely imaged. Multiple distended loops of small and large bowel are noted. Bowel obstruction or adynamic ileus could present in this fashion. Follow-up exam suggested to demonstrate resolution. No free air identified. IMPRESSION: Multiple distended loops of small large bowel noted. Bowel obstruction or adynamic ileus could present this fashion. Follow-up exam suggested to demonstrate resolution. Electronically Signed   By: Maisie Fus  Register   On: 10/21/2019 10:39   ECHOCARDIOGRAM COMPLETE  Result Date: 10/22/2019    ECHOCARDIOGRAM REPORT   Patient Name:   GAURAV BALDREE [facility] Date of Exam: 10/22/2019 Medical Rec #:  [id number]              Height:       72.0 in Accession #:    [id number]             Weight:       410.0 lb Date of Birth:  [birth year]              BSA:          2.891 m Patient Age:    48 years               BP:           87/54 mmHg Patient Gender: M                      HR:           114 bpm. Exam Location:  Inpatient Procedure: 2D Echo, Cardiac Doppler and Color Doppler Indications:    Dyspnea  History:        Patient has no prior history of Echocardiogram examinations.                 Signs/Symptoms:Dyspnea; Risk Factors:Diabetes, Hypertension and                 Morbid obesity.  Sonographer:    Lavenia Atlas Referring Phys: Herminio Heads  Sonographer Comments: No apical window, no subcostal window and patient is morbidly obese. Image acquisition challenging due to patient body habitus. IMPRESSIONS  1. Technically  difficult echo with poor image quality.  2. Left ventricular ejection fraction, by estimation, is 50 to 55%. The  left ventricle has low normal function. The left ventricle has no regional wall motion abnormalities. There is moderate left ventricular hypertrophy. Left ventricular diastolic function could not be evaluated.  3. Right ventricular systolic function was not well visualized. The right ventricular size is not well visualized.  4. The mitral valve is grossly normal. No evidence of mitral valve regurgitation. No evidence of mitral stenosis.  5. The aortic valve is grossly normal. Aortic valve regurgitation is not visualized. No aortic stenosis is present. FINDINGS  Left Ventricle: Left ventricular ejection fraction, by estimation, is 50 to 55%. The left ventricle has low normal function. The left ventricle has no regional wall motion abnormalities. The left ventricular internal cavity size was normal in size. There is moderate left ventricular hypertrophy. Left ventricular diastolic function could not be evaluated. Right Ventricle: The right ventricular size is not well visualized. Right vetricular wall thickness was not assessed. Right ventricular systolic function was not well visualized. Left Atrium: Left atrial size was normal in size. Right Atrium: Right atrial size was not well visualized. Pericardium: There is no evidence of pericardial effusion. Mitral Valve: The mitral valve is grossly normal. No evidence of mitral valve regurgitation. No evidence of mitral valve stenosis. Tricuspid Valve: The tricuspid valve is not well visualized. Tricuspid valve regurgitation is not demonstrated. Aortic Valve: The aortic valve is grossly normal. Aortic valve regurgitation is not visualized. No aortic stenosis is present. Pulmonic Valve: The pulmonic valve was normal in structure. Pulmonic valve regurgitation is not visualized. Aorta: The aortic root and ascending aorta are structurally normal, with no evidence of  dilitation. IAS/Shunts: The interatrial septum was not well visualized. Additional Comments: Technically difficult echo with poor image quality.  LEFT VENTRICLE PLAX 2D LVIDd:         4.95 cm LVIDs:         3.76 cm LV PW:         1.48 cm LV IVS:        1.30 cm LVOT diam:     2.60 cm LVOT Area:     5.31 cm  LEFT ATRIUM         Index LA diam:    4.00 cm 1.38 cm/m   AORTA Ao Root diam: 2.70 cm  SHUNTS Systemic Diam: 2.60 cm Mertie Moores MD Electronically signed by Mertie Moores MD Signature Date/Time: 10/22/2019/3:28:20 PM    Final    VAS [place] LOWER EXTREMITY VENOUS (DVT)  Result Date: 10/21/2019  Lower Venous DVTStudy Indications: Edema.  Risk Factors: None identified. Limitations: Body habitus and poor ultrasound/tissue interface. Comparison Study: No prior studies. Performing Technologist: Oliver Hum RVT  Examination Guidelines: A complete evaluation includes B-mode imaging, spectral Doppler, color Doppler, and power Doppler as needed of all accessible portions of each vessel. Bilateral testing is considered an integral part of a complete examination. Limited examinations for reoccurring indications may be performed as noted. The reflux portion of the exam is performed with the patient in reverse Trendelenburg.  +---------+---------------+---------+-----------+----------+--------------+ RIGHT    CompressibilityPhasicitySpontaneityPropertiesThrombus Aging +---------+---------------+---------+-----------+----------+--------------+ CFV      Full           Yes      Yes                                 +---------+---------------+---------+-----------+----------+--------------+ SFJ      Full                                                        +---------+---------------+---------+-----------+----------+--------------+  FV Prox  Full                                                        +---------+---------------+---------+-----------+----------+--------------+ FV Mid   Full                                                         +---------+---------------+---------+-----------+----------+--------------+ FV DistalFull                                                        +---------+---------------+---------+-----------+----------+--------------+ PFV                                                   Not visualized +---------+---------------+---------+-----------+----------+--------------+ POP      Full           Yes      Yes                                 +---------+---------------+---------+-----------+----------+--------------+ PTV      Full                                                        +---------+---------------+---------+-----------+----------+--------------+ PERO     Full                                                        +---------+---------------+---------+-----------+----------+--------------+   +---------+---------------+---------+-----------+----------+--------------+ LEFT     CompressibilityPhasicitySpontaneityPropertiesThrombus Aging +---------+---------------+---------+-----------+----------+--------------+ CFV      Full           Yes      Yes                                 +---------+---------------+---------+-----------+----------+--------------+ SFJ      Full                                                        +---------+---------------+---------+-----------+----------+--------------+ FV Prox  Full                                                        +---------+---------------+---------+-----------+----------+--------------+  FV Mid   Full                                                        +---------+---------------+---------+-----------+----------+--------------+ FV DistalFull                                                        +---------+---------------+---------+-----------+----------+--------------+ PFV                                                   Not  visualized +---------+---------------+---------+-----------+----------+--------------+ POP      Full           Yes      Yes                                 +---------+---------------+---------+-----------+----------+--------------+ PTV      Full                                                        +---------+---------------+---------+-----------+----------+--------------+ PERO     Full                                                        +---------+---------------+---------+-----------+----------+--------------+     Summary: RIGHT: - There is no evidence of deep vein thrombosis in the lower extremity. However, portions of this examination were limited- see technologist comments above.  - No cystic structure found in the popliteal fossa.  LEFT: - There is no evidence of deep vein thrombosis in the lower extremity. However, portions of this examination were limited- see technologist comments above.  - No cystic structure found in the popliteal fossa.  *See table(s) above for measurements and observations. Electronically signed by Waverly Ferrarihristopher Dickson MD on 10/21/2019 at 7:24:15 PM.    Final    . chlorhexidine  15 mL Mouth Rinse BID  . Chlorhexidine Gluconate Cloth  6 each Topical Q0600  . insulin aspart  0-9 Units Subcutaneous TID WC  . mouth rinse  15 mL Mouth Rinse q12n4p  . metoCLOPramide (REGLAN) injection  10 mg Intravenous Q6H  . metoprolol tartrate  5 mg Intravenous Q8H  . mometasone-formoterol  2 puff Inhalation BID  . pantoprazole (PROTONIX) IV  40 mg Intravenous Q24H  . polyethylene glycol  17 g Oral BID  . senna-docusate  1 tablet Oral BID  . sertraline  50 mg Oral Daily  . sodium chloride flush  10-40 mL Intracatheter Q12H    BMET    Component Value Date/Time   NA 145 10/23/2019 0321   K 4.2 10/23/2019 0321   CL 94 (L) 10/23/2019 0321   CO2  30 10/23/2019 0321   GLUCOSE 115 (H) 10/23/2019 0321   BUN 70 (H) 10/23/2019 0321   CREATININE 6.97 (H) 10/23/2019 0321    CALCIUM 9.3 10/23/2019 0321   GFRNONAA 8 (L) 10/23/2019 0321   GFRAA 10 (L) 10/23/2019 0321   CBC    Component Value Date/Time   WBC 10.2 10/23/2019 0321   RBC 4.67 10/23/2019 0321   HGB 13.2 10/23/2019 0321   HCT 41.5 10/23/2019 0321   PLT 648 (H) 10/23/2019 0321   MCV 88.9 10/23/2019 0321   MCH 28.3 10/23/2019 0321   MCHC 31.8 10/23/2019 0321   RDW 14.6 10/23/2019 0321   LYMPHSABS 1.6 10/23/2019 0321   MONOABS 1.9 (H) 10/23/2019 0321   EOSABS 0.0 10/23/2019 0321   BASOSABS 0.1 10/23/2019 0321    Assessment/Plan: 1. AKI- presumably due to ischemic ATN in setting of hypotension, volume depletion, acute illness, as well as NSAID use and concomitant ARB therapy. Also concerning for bladder outlet obstruction as he tells me he had a foley catheter that was removed before he was discharged yesterday from [facility]. CT scan of abdomen and pelvis are pending. Will order bladder scan as well and he may require foley catheter insertion. His serum creatinine was normal a week ago. 1. Continue to hold ARB and lasix 2. Avoid all NSAIDs and COX-II I's 3. CT of abdomen without evidence of obstruction 4. Foley catheter in place 5. Progressive AKI, oliguric likely due to worsening ischemic ATN in setting of large amounts of NG suction (almost 10 liters) and ongoing hypotension 6. Started CRRT on 10/22/19 due to ongoing hypotension and progressive AKI tolerating it well 1. Pre-filter 4K/2.5Ca at 500 ml/hr, post-filter 4K/2.5Ca at 300 ml/hr, Dialysate 4K/2.5Ca at 1800, UF goal to keep even to -50 ml/hr, no heparin 2. Acute hypoxic respiratory distress- possible aspiration and now on CPAP.  PCCM consult as above.  Markedly improved this morning 3. Bilateral knee injuries with tendon damage as well as menisci. Ortho evaluating but injury occurred 4 weeks ago and his super morbid obesity, AKI, and ileus vs. SBO will delay intervention. 4. N/V/abdominal distension- abd series with dilated  loops of bowel, possible ileus vs. SBO. Surgery consulted and CT scan supports severe ileus.   1. S/p NGT with large amounts of output (over 10 liters in 24 hours) 2. Starting to pass flatus 3. Continue NGT 5. Atrial fibrillation 6. HTN- hold BP meds now that he is hypotensive. 7. Morbid obesity 8. DM- per primary 9. Chronic combined systolic and diastolic CHF- stable for now. 10. Debility- pt unable to stand or walk due to obesity and knee injuries.  Irena Cords, MD [employer] [phone]

## 2019-10-23 NOTE — Progress Notes (Signed)
   Subjective:  Patient stable from ortho stand point Developed ileus, ARF, placed on CPAP  Objective:   VITALS:   Vitals:   10/23/19 1245 10/23/19 1300 10/23/19 1315 10/23/19 1330  BP: 109/72 96/71 104/69 97/68  Pulse: (!) 109 (!) 110 (!) 112 (!) 111  Resp: (!) 27 (!) 25 (!) 22 (!) 23  Temp:      TempSrc:      SpO2: 95% 97% 94% 91%  Weight:      Height:        Ortho exam unchanged   Lab Results  Component Value Date   WBC 10.2 10/23/2019   HGB 13.2 10/23/2019   HCT 41.5 10/23/2019   MCV 88.9 10/23/2019   PLT 648 (H) 10/23/2019     Assessment/Plan:      - patient will need surgical repair of both knee injuries but will need to be postponed until he is medically optimized - ortho will follow along  Glee Arvin 10/23/2019, 2:01 PM [phone]

## 2019-10-23 NOTE — Progress Notes (Signed)
NAME:  Douglas [name], MRN:  [id number], DOB:  March 14, 1971, LOS: 3 ADMISSION DATE:  10/20/2019, CONSULTATION DATE: 10/22/2019 REFERRING MD: Dr. Blake Divine, Dr. Abel Presto, CHIEF COMPLAINT:  Renal failure, SOB   Brief History   49 year old male, acute renal failure, oliguria, I see admission for CVVHD and hypotension.  History of present illness   This is a 49 year old gentleman history of diabetes hypertension came to the hospital after mechanical fall which occurred several weeks ago.  Patient was found to have acute renal failure.  And concern for SBO versus ileus.  NG tube was placed.  A rapid response occurred the night of 10/21/2019.  He had a significant amount of NG output.  He was also given 1 L fluid bolus.  Patient was seen by surgery for ileus.  Recommending continued NG tube decompression.  Echocardiogram was completed today.  Renal ultrasound pending.  Patient had worsening kidney function, initial serum creatinine of 2.5 up to 3.24.  Now serum creatinine 6.25 with a serum bicarb of 24.  Nephrology was consulted for evaluation.  Due to low blood pressures and very little urine output PCCM was consulted for recommendations transferred to the intensive care unit for close observation and likely initiation of CVVHD.  Patient does have history of paroxysmal atrial fibrillation.  Was on Eliquis PTA.  Switch to heparin infusion.  Past Medical History   Past Medical History:  Diagnosis Date  . CHF (congestive heart failure) (HCC)   . Degenerative arthritis   . Diabetes mellitus without complication (HCC)   . Gout      Significant Hospital Events   None   Consults:  Pulmonary critical care Nephrology Pharmacy Surgery  Procedures:  none  Significant Diagnostic Tests:  Previous echocardiogram EF 45%  10/22/2019 echo: Pending Renal ultrasound 10/22/2019: Pending  Micro Data:  10/20/2019: Covid negative influenza PCR negative  Antimicrobials:  Ceftriaxone  Interim  history/subjective:  Looking and feeling much better today.  Denies any pain.  Objective   Blood pressure 106/69, pulse 94, temperature 98.8 F (37.1 C), temperature source Axillary, resp. rate (!) 22, height 6' (1.829 m), weight (!) 188.2 kg, SpO2 95 %. CVP:  [1 mmHg-11 mmHg] 2 mmHg  FiO2 (%):  [28 %] 28 %   Intake/Output Summary (Last 24 hours) at 10/23/2019 0806 Last data filed at 10/23/2019 0700 Gross per 24 hour  Intake 4108.24 ml  Output 8033 ml  Net -3924.76 ml   Filed Weights   10/21/19 0120 10/22/19 1948 10/23/19 0409  Weight: (!) 186 kg (!) 186 kg (!) 188.2 kg    Examination: GEN: Overweight man in no acute distress HEENT: Mallampati 4, trachea midline CV: Heart sounds are regular, extremities are warm PULM: Diminished secondary to body habitus, no wheezing GI: Protuberant, soft, hypoactive bowel sounds, NG tube in place with bilious output EXT: Diffuse anasarca remains NEURO: He moves all 4 extremities to command PSYCH: RASS 0, good insight SKIN: No rashes, right IJ HD catheter clean dry and intact  White count mildly elevated, hemoglobin stable, stable thrombocytosis, renal indices improving, CBGs good  Chest x-ray 10/22/2019 showing good position of HD catheter as well as pulmonary edema.  Resolved Hospital Problem list     Assessment & Plan:   Acute renal failure Multifactorial related to dehydration poor renal perfusion, diuretics, ARB and previous NSAID use. -Continue CRRT, avoid nephrotoxins  Morbid obesity, sleep apnea, likely obesity hypoventilation syndrome. We will try to avoid BiPAP in the setting of significant ileus  Ileus- related  to renal failure - NG to suction - Trial of reglan - Will consider neostigmine subQ if persistent - Surgery following  Shock- unclear etiology, ? sepsis hx HTN.  Possible UTI - Continue pressors, requirements are improving - Ceftriaxone x 7 days reasonable, cultures unrevealing to date, check Pct  Paroxysmal  atrial fibrillation -Rate controlled at this time -Continue metoprolol as blood pressure allows. -Remains on IV heparin for full dose anticoagulation.  History of chronic systolic heart failure Plan: Repeat echo terrible windows not unexpected, EF looked grossly preserved  Type 2 diabetes, hyperglycemia Continue SSI with CBGs.   Best practice:  Diet: N.p.o. Pain/Anxiety/Delirium protocol (if indicated): Not applicable VAP protocol (if indicated): Not applicable DVT prophylaxis: heparin infusion GI prophylaxis: None Glucose control: SSI Mobility: BR Code Status: FULL Family Communication: updated patient  Disposition: ICU  The patient is critically ill with multiple organ systems failure and requires high complexity decision making for assessment and support, frequent evaluation and titration of therapies, application of advanced monitoring technologies and extensive interpretation of multiple databases. Critical Care Time devoted to patient care services described in this note independent of APP/resident time (if applicable)  is 43 minutes.   Erskine Emery MD [facility] 10/23/2019 8:06 AM Personal pager: [phone] If unanswered, please page CCM On-call: [phone]

## 2019-10-23 NOTE — Progress Notes (Signed)
eLink Physician-Brief Progress Note Patient Name: Douglas [name] DOB: 1971-02-25 MRN: [id number]   Date of Service  10/23/2019  HPI/Events of Note  Soft BP while on CRRT. UF goal had been -50 cc/hr net, but RN states patient is having a large amount of enteric tube output so wondering about a net even goal in terms of UF.   eICU Interventions  Agree with running even for now.  Norepi ordered for BP support while on CRRT.  Give soft BPs and enteral losses, will need to keep an eye on the I/O balance and consider whether giving back some volume is indicated.      Intervention Category Major Interventions: Hypotension - evaluation and management  Marveen Reeks [name] 10/23/2019, 2:49 AM

## 2019-10-23 NOTE — Progress Notes (Signed)
ANTICOAGULATION CONSULT NOTE  Pharmacy Consult for heparin Indication: afib  Allergies  Allergen Reactions  . Benazepril Anaphylaxis  . Betadine [Povidone Iodine]     rash  . Prednisone Other (See Comments)    Headache.    Patient Measurements: Height: 6' (182.9 cm) Weight: (!) 188.2 kg (415 lb) IBW/kg (Calculated) : 77.6 Heparin Dosing Weight: 123kg  Vital Signs: Temp: 98.8 F (37.1 C) (05/09 0300) Temp Source: Axillary (05/09 0300) BP: 75/53 (05/09 0400) Pulse Rate: 95 (05/09 0400)  Labs: Recent Labs    10/21/19 0443 10/21/19 0443 10/21/19 1113 10/22/19 0220 10/22/19 1610 10/23/19 0321  HGB 13.1   < >  --  12.7*  --  13.2  HCT 41.6  --   --  39.9  --  41.5  PLT 671*  --   --  617*  --  648*  APTT  --   --   --   --   --  37*  HEPARINUNFRC  --   --   --   --   --  1.24*  CREATININE 3.24*   < >  --  6.25* 8.26* 6.97*  CKTOTAL  --   --  344  --   --   --    < > = values in this interval not displayed.    Estimated Creatinine Clearance: 21.8 mL/min (A) (by C-G formula based on SCr of 6.97 mg/dL (H)).  Assessment: 49 yo male with h/o AFib, Eliquis on hold, or heparin.  APTT subtherapeutic Goal of Therapy:  Heparin level 0.3-0.7 units/ml aPTT 66-102 seconds Monitor platelets by anticoagulation protocol: Yes   Plan:  Increase Heparin 2000 units/hr APTT in 8 hours  Geannie Risen, PharmD, BCPS

## 2019-10-23 NOTE — Progress Notes (Signed)
ANTICOAGULATION CONSULT NOTE  Pharmacy Consult for heparin Indication: Afib  Allergies  Allergen Reactions  . Benazepril Anaphylaxis  . Betadine [Povidone Iodine]     rash  . Prednisone Other (See Comments)    Headache.    Patient Measurements: Height: 6' (182.9 cm) Weight: (!) 188.2 kg (415 lb) IBW/kg (Calculated) : 77.6 Heparin Dosing Weight: 123kg  Vital Signs: Temp: 98.7 F (37.1 C) (05/09 1900) Temp Source: Axillary (05/09 1900) BP: 91/54 (05/09 2015) Pulse Rate: 116 (05/09 2015)  Labs: Recent Labs    10/21/19 0443 10/21/19 0443 10/21/19 1113 10/22/19 0220 10/22/19 0220 10/22/19 1610 10/23/19 0321 10/23/19 1210 10/23/19 1550 10/23/19 1956  HGB 13.1   < >  --  12.7*  --   --  13.2  --   --   --   HCT 41.6  --   --  39.9  --   --  41.5  --   --   --   PLT 671*  --   --  617*  --   --  648*  --   --   --   APTT  --   --   --   --   --   --  37* 50*  --  78*  HEPARINUNFRC  --   --   --   --   --   --  1.24*  --   --   --   CREATININE 3.24*   < >  --  6.25*   < > 8.26* 6.97*  --  5.63*  --   CKTOTAL  --   --  344  --   --   --   --   --   --   --    < > = values in this interval not displayed.    Estimated Creatinine Clearance: 27 mL/min (A) (by C-G formula based on SCr of 5.63 mg/dL (H)).  Assessment: 49 yo male with h/o AFib, Eliquis on hold, or heparin.   Repeat aPTT this evening therapeutic at 78 seconds.  Goal of Therapy:  Heparin level 0.3-0.7 units/ml aPTT 66-102 seconds Monitor platelets by anticoagulation protocol: Yes   Plan:  Continue heparin 2400 units/hr Repeat aPTT in am   Fredonia Highland, PharmD, BCPS Clinical Pharmacist [phone] Please check AMION for all [facility] numbers 10/23/2019

## 2019-10-23 NOTE — Progress Notes (Signed)
Progress Note: General Surgery Service   Chief Complaint/Subjective: Abdomen less painful, patient started on CRRT yesterday and transferred to ICU for hypotension requiring levophed  Objective: Vital signs in last 24 hours: Temp:  [97.7 F (36.5 C)-98.8 F (37.1 C)] 98.8 F (37.1 C) (05/09 0300) Pulse Rate:  [51-115] 94 (05/09 0719) Resp:  [17-28] 22 (05/09 0719) BP: (75-113)/(52-78) 106/69 (05/09 0700) SpO2:  [89 %-98 %] 95 % (05/09 0719) FiO2 (%):  [28 %] 28 % (05/09 0719) Weight:  [186 kg-188.2 kg] 188.2 kg (05/09 0409) Last BM Date: 10/21/19  Intake/Output from previous day: 05/08 0701 - 05/09 0700 In: 4108.2 [I.V.:3508.2; IV Piggyback:600] Out: 8033 [Urine:10; Emesis/NG output:7400] Intake/Output this shift: No intake/output data recorded.  Gen: NAD  Resp: nonlabored  Card: RRR  Abd: with obesity, nontender, no guarding  Lab Results: CBC  Recent Labs    10/22/19 0220 10/23/19 0321  WBC 6.4 10.2  HGB 12.7* 13.2  HCT 39.9 41.5  PLT 617* 648*   BMET Recent Labs    10/22/19 1610 10/23/19 0321  NA 147* 145  K 4.0 4.2  CL 94* 94*  CO2 29 30  GLUCOSE 113* 115*  BUN 86* 70*  CREATININE 8.26* 6.97*  CALCIUM 9.3 9.3   PT/INR No results for input(s): LABPROT, INR in the last 72 hours. ABG No results for input(s): PHART, HCO3 in the last 72 hours.  Invalid input(s): PCO2, PO2  Anti-infectives: Anti-infectives (From admission, onward)   Start     Dose/Rate Route Frequency Ordered Stop   10/23/19 0000  cefTRIAXone (ROCEPHIN) 1 g in sodium chloride 0.9 % 100 mL IVPB     1 g 200 mL/hr over 30 Minutes Intravenous Every 24 hours 10/22/19 1357        Medications: Scheduled Meds: . chlorhexidine  15 mL Mouth Rinse BID  . Chlorhexidine Gluconate Cloth  6 each Topical Q0600  . insulin aspart  0-9 Units Subcutaneous TID WC  . mouth rinse  15 mL Mouth Rinse q12n4p  . metoprolol tartrate  5 mg Intravenous Q8H  . mometasone-formoterol  2 puff Inhalation  BID  . pantoprazole (PROTONIX) IV  40 mg Intravenous Q24H  . polyethylene glycol  17 g Oral BID  . senna-docusate  1 tablet Oral BID  . sertraline  50 mg Oral Daily  . sodium chloride flush  10-40 mL Intracatheter Q12H   Continuous Infusions: .  prismasol BGK 4/2.5 500 mL/hr at 10/23/19 0647  .  prismasol BGK 4/2.5 300 mL/hr at 10/22/19 2028  . sodium chloride 150 mL/hr at 10/23/19 0759  . cefTRIAXone (ROCEPHIN)  IV Stopped (10/23/19 0052)  . heparin 2,000 Units/hr (10/23/19 7672)  . norepinephrine (LEVOPHED) Adult infusion 16 mcg/min (10/23/19 0802)  . prismasol BGK 4/2.5 1,500 mL/hr at 10/23/19 0647   PRN Meds:.acetaminophen **OR** acetaminophen, docusate sodium, heparin, heparin, ondansetron (ZOFRAN) IV, polyethylene glycol, promethazine, simethicone, sodium chloride flush  Assessment/Plan: 49 yo male with multiple comorbidities with ileus after knee injuries. 7 l out yesterday -continue NG tube today -NPO   LOS: 3 days   Rodman Pickle, MD [phone] [facility]

## 2019-10-23 NOTE — Progress Notes (Signed)
ANTICOAGULATION CONSULT NOTE  Pharmacy Consult for heparin Indication: Afib  Allergies  Allergen Reactions  . Benazepril Anaphylaxis  . Betadine [Povidone Iodine]     rash  . Prednisone Other (See Comments)    Headache.    Patient Measurements: Height: 6' (182.9 cm) Weight: (!) 188.2 kg (415 lb) IBW/kg (Calculated) : 77.6 Heparin Dosing Weight: 123kg  Vital Signs: Temp: 98.8 F (37.1 C) (05/09 0300) Temp Source: Axillary (05/09 0300) BP: 106/69 (05/09 0700) Pulse Rate: 94 (05/09 0719)  Labs: Recent Labs    10/21/19 0443 10/21/19 0443 10/21/19 1113 10/22/19 0220 10/22/19 1610 10/23/19 0321  HGB 13.1   < >  --  12.7*  --  13.2  HCT 41.6  --   --  39.9  --  41.5  PLT 671*  --   --  617*  --  648*  APTT  --   --   --   --   --  37*  HEPARINUNFRC  --   --   --   --   --  1.24*  CREATININE 3.24*   < >  --  6.25* 8.26* 6.97*  CKTOTAL  --   --  344  --   --   --    < > = values in this interval not displayed.    Estimated Creatinine Clearance: 21.8 mL/min (A) (by C-G formula based on SCr of 6.97 mg/dL (H)).  Assessment: 49 yo male with h/o AFib, Eliquis on hold, or heparin.  APTT subtherapeutic at 50s. No bleeding issues noted, cbc stable.   Goal of Therapy:  Heparin level 0.3-0.7 units/ml aPTT 66-102 seconds Monitor platelets by anticoagulation protocol: Yes   Plan:  Increase Heparin 2400 units/hr APTT in 6 hours  Sheppard Coil PharmD., BCPS Clinical Pharmacist 10/23/2019 8:02 AM

## 2019-10-24 ENCOUNTER — Inpatient Hospital Stay (HOSPITAL_COMMUNITY): Payer: Medicare Other

## 2019-10-24 LAB — GLUCOSE, CAPILLARY
Glucose-Capillary: 103 mg/dL — ABNORMAL HIGH (ref 70–99)
Glucose-Capillary: 101 mg/dL — ABNORMAL HIGH (ref 70–99)
Glucose-Capillary: 95 mg/dL (ref 70–99)
Glucose-Capillary: 85 mg/dL (ref 70–99)
Glucose-Capillary: 88 mg/dL (ref 70–99)

## 2019-10-24 LAB — RENAL FUNCTION PANEL
Albumin: 2.9 g/dL — ABNORMAL LOW (ref 3.5–5.0)
Albumin: 2.6 g/dL — ABNORMAL LOW (ref 3.5–5.0)
Anion gap: 20 — ABNORMAL HIGH (ref 5–15)
Anion gap: 14 (ref 5–15)
BUN: 43 mg/dL — ABNORMAL HIGH (ref 6–20)
BUN: 34 mg/dL — ABNORMAL HIGH (ref 6–20)
CO2: 26 mmol/L (ref 22–32)
CO2: 24 mmol/L (ref 22–32)
Calcium: 9.4 mg/dL (ref 8.9–10.3)
Calcium: 9.1 mg/dL (ref 8.9–10.3)
Chloride: 96 mmol/L — ABNORMAL LOW (ref 98–111)
Chloride: 104 mmol/L (ref 98–111)
Creatinine, Ser: 5.03 mg/dL — ABNORMAL HIGH (ref 0.61–1.24)
Creatinine, Ser: 4.13 mg/dL — ABNORMAL HIGH (ref 0.61–1.24)
GFR calc Af Amer: 14 mL/min — ABNORMAL LOW (ref >60)
GFR calc Af Amer: 18 mL/min — ABNORMAL LOW (ref >60)
GFR calc non Af Amer: 12 mL/min — ABNORMAL LOW (ref >60)
GFR calc non Af Amer: 16 mL/min — ABNORMAL LOW (ref >60)
Glucose, Bld: 114 mg/dL — ABNORMAL HIGH (ref 70–99)
Glucose, Bld: 112 mg/dL — ABNORMAL HIGH (ref 70–99)
Phosphorus: 4.1 mg/dL (ref 2.5–4.6)
Phosphorus: 3.5 mg/dL (ref 2.5–4.6)
Potassium: 4.2 mmol/L (ref 3.5–5.1)
Potassium: 3.9 mmol/L (ref 3.5–5.1)
Sodium: 142 mmol/L (ref 135–145)
Sodium: 142 mmol/L (ref 135–145)

## 2019-10-24 LAB — CBC
HCT: 43.5 % (ref 39.0–52.0)
Hemoglobin: 13.5 g/dL (ref 13.0–17.0)
MCH: 28.5 pg (ref 26.0–34.0)
MCHC: 31 g/dL (ref 30.0–36.0)
MCV: 91.8 fL (ref 80.0–100.0)
Platelets: 588 10*3/uL — ABNORMAL HIGH (ref 150–400)
RBC: 4.74 MIL/uL (ref 4.22–5.81)
RDW: 14.8 % (ref 11.5–15.5)
WBC: 10.4 10*3/uL (ref 4.0–10.5)
nRBC: 0 % (ref 0.0–0.2)

## 2019-10-24 LAB — APTT
aPTT: 103 seconds — ABNORMAL HIGH (ref 24–36)
aPTT: 109 seconds — ABNORMAL HIGH (ref 24–36)

## 2019-10-24 LAB — MAGNESIUM: Magnesium: 2.7 mg/dL — ABNORMAL HIGH (ref 1.7–2.4)

## 2019-10-24 MED ORDER — BISACODYL 10 MG RE SUPP
10.0000 mg | Freq: Once | RECTAL | Status: AC
  Administered 2019-10-24: 12:00:00 10 mg via RECTAL
  Filled 2019-10-24: qty 1

## 2019-10-24 MED ORDER — INSULIN ASPART 100 UNIT/ML ~~LOC~~ SOLN
0.0000 [IU] | Freq: Four times a day (QID) | SUBCUTANEOUS | Status: DC
  Administered 2019-10-28 – 2019-10-29 (×2): 1 [IU] via SUBCUTANEOUS
  Administered 2019-11-05: 2 [IU] via SUBCUTANEOUS

## 2019-10-24 MED ORDER — TRAVASOL 10 % IV SOLN
INTRAVENOUS | Status: AC
  Filled 2019-10-24: qty 649.92

## 2019-10-24 MED ORDER — SODIUM CHLORIDE 0.9 % IV SOLN: INTRAVENOUS | Status: AC

## 2019-10-24 MED ORDER — SODIUM CHLORIDE 0.9 % IV SOLN: INTRAVENOUS | Status: DC

## 2019-10-24 MED ORDER — SODIUM CHLORIDE 0.9 % IV BOLUS
1000.0000 mL | Freq: Once | INTRAVENOUS | Status: AC
  Administered 2019-10-24: 1000 mL via INTRAVENOUS

## 2019-10-24 NOTE — Progress Notes (Signed)
Nutrition Follow up  DOCUMENTATION CODES:   Morbid obesity  INTERVENTION:    Initiate TPN once access is obtained  Monitor for ability to advance diet  NUTRITION DIAGNOSIS:   Inadequate oral intake related to altered GI function as evidenced by NPO status.  Ongoing  GOAL:   Patient will meet greater than or equal to 90% of their needs   Addressed via TPN  MONITOR:   Diet advancement, Labs, Weight trends, Skin, I & O's  REASON FOR ASSESSMENT:   Malnutrition Screening Tool    ASSESSMENT:   Douglas [name] is 49 y.o. male with PMH of CHF, DM, HTN, gout who presents to ER with bilateral knee pain. Patient has been admitted to [facility] for 4 weeks for knee pain after a fall. He has been unable to bear weight since this injury. Hospital was unable to obtain MRI during this time period. Patient reports x-rays were normal.   5/8- start CRRT  RD working remotely.  Pressor requirement improving. Being kept even on CRRT. Xray this am consistent with SBO or ileus. No plans for surgery, continue conservative management. NGT output remains significant. Reglan started yesterday. Unable to place PICC today due to suspect volume depletion. CCM to try tomorrow and TPN to start after. Discussed plan with pharmacy.   Admission weight: 186 kg  Current weight: 181 kg   UOP: oliguric   NGT: 5,050 ml x 24 hrs CRRT: 282 ml x 24 hrs   Drips: NS @ 150 ml/hr, levophed Medications: SS novolog, 10 mg reglan TID, miralax, senokot Labs: Cr 5.03-trending down Mg 2.7 (H) CBG 96-128  Diet Order:   Diet Order            Diet NPO time specified  Diet effective now              EDUCATION NEEDS:   Not appropriate for education at this time  Skin:  Skin Assessment: Skin Integrity Issues: Skin Integrity Issues:: Other (Comment) Incisions: - Other: skin tear to rt and lt buttocks; MASD to back, thigh, scrotum, groin, buttocks  Last BM:  5/8  Height:   Ht Readings  from Last 1 Encounters:  10/21/19 6' (1.829 m)    Weight:   Wt Readings from Last 1 Encounters:  10/24/19 (!) 181 kg    Ideal Body Weight:  80.9 kg  BMI:  Body mass index is 54.11 kg/m.  Estimated Nutritional Needs:   Kcal:  2300-2500 kcal  Protein:  125-160 grams  Fluid:  >/= 2.3 L/day   Vanessa Kick RD, LDN Clinical Nutrition Pager listed in AMION

## 2019-10-24 NOTE — Progress Notes (Signed)
eLink Physician-Brief Progress Note Patient Name: Douglas [name] DOB: Nov 12, 1970 MRN: [id number]   Date of Service  10/24/2019  HPI/Events of Note  Notified that feeding output now has decreased, previously 500 cc/hr. Not pulling fluid on CRRT. Patient remains on pressor without increased requirement.  eICU Interventions  CVP 1-2 Continue current CRRT rate Patient also receiving 150/hr     Intervention Category Major Interventions: Other:  Darl Pikes 10/24/2019, 4:42 AM

## 2019-10-24 NOTE — Progress Notes (Signed)
Patient ID: Bishoy Cupp, male   DOB: 31-Dec-1970, 49 y.o.   MRN: [id number]       Subjective: No abdominal pain. Still with a lot from his NGT.  Feels rumbling like he needs to have a BM, but unsuccessful so far.  ROS: See above, otherwise other systems negative  Objective: Vital signs in last 24 hours: Temp:  [97.4 F (36.3 C)-98.7 F (37.1 C)] 98.1 F (36.7 C) (05/10 0720) Pulse Rate:  [47-123] 104 (05/10 0726) Resp:  [17-27] 22 (05/10 0726) BP: (75-125)/(46-89) 94/70 (05/10 0700) SpO2:  [91 %-100 %] 98 % (05/10 0700) Weight:  [180.5 kg-181 kg] 181 kg (05/10 0406) Last BM Date: 10/22/19  Intake/Output from previous day: 05/09 0701 - 05/10 0700 In: 4485.2 [I.V.:4385.2; IV Piggyback:100] Out: 5332 [Emesis/NG output:5050] Intake/Output this shift: No intake/output data recorded.  PE: Heart: regular, mildly tachy Lungs: CTAB with anterior auscultation Abd: soft, mild bloating, obese, hypoactive BS, NGT with 5500cc yesterday documented, bilious, NT  Lab Results:  Recent Labs    10/23/19 0321 10/24/19 0358  WBC 10.2 10.4  HGB 13.2 13.5  HCT 41.5 43.5  PLT 648* 588*   BMET Recent Labs    10/23/19 1550 10/24/19 0358  NA 144 142  K 3.8 4.2  CL 96* 96*  CO2 28 26  GLUCOSE 114* 114*  BUN 53* 43*  CREATININE 5.63* 5.03*  CALCIUM 9.1 9.4   PT/INR No results for input(s): LABPROT, INR in the last 72 hours. CMP     Component Value Date/Time   NA 142 10/24/2019 0358   K 4.2 10/24/2019 0358   CL 96 (L) 10/24/2019 0358   CO2 26 10/24/2019 0358   GLUCOSE 114 (H) 10/24/2019 0358   BUN 43 (H) 10/24/2019 0358   CREATININE 5.03 (H) 10/24/2019 0358   CALCIUM 9.4 10/24/2019 0358   PROT 8.6 (H) 10/23/2019 0321   ALBUMIN 2.9 (L) 10/24/2019 0358   AST 25 10/23/2019 0321   ALT 37 10/23/2019 0321   ALKPHOS 66 10/23/2019 0321   BILITOT 1.2 10/23/2019 0321   GFRNONAA 12 (L) 10/24/2019 0358   GFRAA 14 (L) 10/24/2019 0358   Lipase  No results found for:  LIPASE     Studies/Results: US RENAL  Result Date: 10/22/2019 CLINICAL DATA:  Acute kidney injury EXAM: RENAL / URINARY TRACT ULTRASOUND COMPLETE COMPARISON:  Oct 21, 2019 FINDINGS: Right Kidney: Renal measurements: 9.2 x 3.9 x 5.1 cm = volume: 95 mL . Echogenicity within normal limits. No mass or hydronephrosis visualized. Left Kidney: Renal measurements: 8.1 x 5.5 x 5 = volume: 116 mL. Echogenicity within normal limits. No mass or hydronephrosis visualized. Bladder: The bladder is decompressed with a Foley catheter which limits evaluation. Other: Overall evaluation of the kidneys was limited by patient body habitus. IMPRESSION: 1. Habitus limited study. 2. No definite acute abnormality given the above limitation. No evidence for hydronephrosis. 3. Bladder decompressed with a Foley catheter which limits evaluation. Electronically Signed   By: Constance Holster M.D.   On: 10/22/2019 16:48   DG Chest Port 1 View  Result Date: 10/22/2019 CLINICAL DATA:  Encounter for central line placement. EXAM: PORTABLE CHEST 1 VIEW COMPARISON:  Oct 21, 2019 FINDINGS: There is a newly placed non tunneled dialysis catheter on the right. The tip terminates over the cavoatrial junction. The enteric tube tip is not visualized on this study. The lung volumes are low. The heart size is enlarged. There is vascular congestion with pulmonary edema. There is no pneumothorax.  IMPRESSION: 1. Well-positioned right-sided non tunneled dialysis catheter. No pneumothorax. 2. Cardiomegaly with persistent pulmonary edema. 3. Low lung volumes. Electronically Signed   By: Katherine Mantle M.D.   On: 10/22/2019 18:04   DG Abd Portable 1V  Result Date: 10/24/2019 CLINICAL DATA:  Nasogastric tube present. EXAM: PORTABLE ABDOMEN - 1 VIEW COMPARISON:  Oct 21, 2019. FINDINGS: Continued small bowel dilatation is noted. Distal tip of nasogastric tube is seen in proximal stomach. No radio-opaque calculi or other significant radiographic abnormality  are seen. IMPRESSION: Distal tip of nasogastric tube seen in proximal stomach. Continued small bowel dilatation is noted concerning for distal small bowel obstruction or ileus. Electronically Signed   By: Lupita Raider M.D.   On: 10/24/2019 08:09   ECHOCARDIOGRAM COMPLETE  Result Date: 10/22/2019    ECHOCARDIOGRAM REPORT   Patient Name:   FAISAL STRADLING [facility] Date of Exam: 10/22/2019 Medical Rec #:  [id number]              Height:       72.0 in Accession #:    [id number]             Weight:       410.0 lb Date of Birth:  26-Dec-1970              BSA:          2.891 m Patient Age:    48 years               BP:           87/54 mmHg Patient Gender: M                      HR:           114 bpm. Exam Location:  Inpatient Procedure: 2D Echo, Cardiac Doppler and Color Doppler Indications:    Dyspnea  History:        Patient has no prior history of Echocardiogram examinations.                 Signs/Symptoms:Dyspnea; Risk Factors:Diabetes, Hypertension and                 Morbid obesity.  Sonographer:    Lavenia Atlas Referring Phys: Herminio Heads  Sonographer Comments: No apical window, no subcostal window and patient is morbidly obese. Image acquisition challenging due to patient body habitus. IMPRESSIONS  1. Technically difficult echo with poor image quality.  2. Left ventricular ejection fraction, by estimation, is 50 to 55%. The left ventricle has low normal function. The left ventricle has no regional wall motion abnormalities. There is moderate left ventricular hypertrophy. Left ventricular diastolic function could not be evaluated.  3. Right ventricular systolic function was not well visualized. The right ventricular size is not well visualized.  4. The mitral valve is grossly normal. No evidence of mitral valve regurgitation. No evidence of mitral stenosis.  5. The aortic valve is grossly normal. Aortic valve regurgitation is not visualized. No aortic stenosis is present. FINDINGS  Left Ventricle: Left  ventricular ejection fraction, by estimation, is 50 to 55%. The left ventricle has low normal function. The left ventricle has no regional wall motion abnormalities. The left ventricular internal cavity size was normal in size. There is moderate left ventricular hypertrophy. Left ventricular diastolic function could not be evaluated. Right Ventricle: The right ventricular size is not well visualized. Right vetricular wall thickness was not assessed. Right ventricular systolic function  was not well visualized. Left Atrium: Left atrial size was normal in size. Right Atrium: Right atrial size was not well visualized. Pericardium: There is no evidence of pericardial effusion. Mitral Valve: The mitral valve is grossly normal. No evidence of mitral valve regurgitation. No evidence of mitral valve stenosis. Tricuspid Valve: The tricuspid valve is not well visualized. Tricuspid valve regurgitation is not demonstrated. Aortic Valve: The aortic valve is grossly normal. Aortic valve regurgitation is not visualized. No aortic stenosis is present. Pulmonic Valve: The pulmonic valve was normal in structure. Pulmonic valve regurgitation is not visualized. Aorta: The aortic root and ascending aorta are structurally normal, with no evidence of dilitation. IAS/Shunts: The interatrial septum was not well visualized. Additional Comments: Technically difficult echo with poor image quality.  LEFT VENTRICLE PLAX 2D LVIDd:         4.95 cm LVIDs:         3.76 cm LV PW:         1.48 cm LV IVS:        1.30 cm LVOT diam:     2.60 cm LVOT Area:     5.31 cm  LEFT ATRIUM         Index LA diam:    4.00 cm 1.38 cm/m   AORTA Ao Root diam: 2.70 cm  SHUNTS Systemic Diam: 2.60 cm Kristeen Miss MD Electronically signed by Kristeen Miss MD Signature Date/Time: 10/22/2019/3:28:20 PM    Final     Anti-infectives: Anti-infectives (From admission, onward)   Start     Dose/Rate Route Frequency Ordered Stop   10/23/19 0000  cefTRIAXone (ROCEPHIN) 1 g in  sodium chloride 0.9 % 100 mL IVPB     1 g 200 mL/hr over 30 Minutes Intravenous Every 24 hours 10/22/19 1357 10/30/19 2159       Assessment/Plan ARF- on CRRT Knee injury DM CHF HTN A fib  Ileus -cont NGT for gastric decompression given high output -dulcolax suppository today for stimulation from below per patient request -mobilize as able, although unlikely currently while on CRRT and difficult given knee injury. -electrolytes stable -start TNA today for nutritional support  FEN - NPO/NGT/ start TNA today for nutritional support VTE - heparin for a fib ID - none   LOS: 4 days    Letha Cape , [facility] 10/24/2019, 8:15 AM Please see Amion for pager number during day hours 7:00am-4:30pm or 7:00am -11:30am on weekends

## 2019-10-24 NOTE — Progress Notes (Signed)
NAME:  Douglas [name], MRN:  [id number], DOB:  10-02-1970, LOS: 4 ADMISSION DATE:  10/20/2019, CONSULTATION DATE: 10/22/2019 REFERRING MD: Dr. Karleen Hampshire, Dr. Arty Baumgartner, CHIEF COMPLAINT:  Renal failure, SOB   Brief History   49 year old male, acute renal failure, oliguria, I see admission for CVVHD and hypotension.  History of present illness   This is a 49 year old gentleman history of diabetes hypertension came to the hospital after mechanical fall which occurred several weeks ago.  Patient was found to have acute renal failure.  And concern for SBO versus ileus.  NG tube was placed.  A rapid response occurred the night of 10/21/2019.  He had a significant amount of NG output.  He was also given 1 L fluid bolus.  Patient was seen by surgery for ileus.  Recommending continued NG tube decompression.  Echocardiogram was completed today.  Renal ultrasound pending.  Patient had worsening kidney function, initial serum creatinine of 2.5 up to 3.24.  Now serum creatinine 6.25 with a serum bicarb of 24.  Nephrology was consulted for evaluation.  Due to low blood pressures and very little urine output PCCM was consulted for recommendations transferred to the intensive care unit for close observation and likely initiation of CVVHD.  Patient does have history of paroxysmal atrial fibrillation.  Was on Eliquis PTA.  Switch to heparin infusion.  Past Medical History   Past Medical History:  Diagnosis Date  . CHF (congestive heart failure) ([place])   . Degenerative arthritis   . Diabetes mellitus without complication ([place])   . Gout      Significant Hospital Events   None   Consults:  Pulmonary critical care Nephrology Pharmacy Surgery  Procedures:  none  Significant Diagnostic Tests:  Previous echocardiogram EF 45%  10/22/2019 echo: Pending Renal ultrasound 10/22/2019: Pending  Micro Data:  10/20/2019: Covid negative influenza PCR negative  Antimicrobials:  Ceftriaxone  Interim  history/subjective:  Continues to have high NGT output. No BM or flatus. Denies pain. Breathing stable.  Objective   Blood pressure (!) 144/83, pulse 94, temperature 98.1 F (36.7 C), resp. rate (!) 24, height 6' (1.829 m), weight (!) 181 kg, SpO2 98 %. CVP:  [1 mmHg-3 mmHg] 1 mmHg      Intake/Output Summary (Last 24 hours) at 10/24/2019 0904 Last data filed at 10/24/2019 0800 Gross per 24 hour  Intake 4076.21 ml  Output 5097 ml  Net -1020.79 ml   Filed Weights   10/23/19 0409 10/24/19 0000 10/24/19 0406  Weight: (!) 188.2 kg (!) 180.5 kg (!) 181 kg    Examination: GEN: Overweight man in no acute distress HEENT: Mallampati 4, trachea midline CV: Heart sounds are regular, extremities are warm PULM: Diminished secondary to body habitus, no wheezing GI: Protuberant, absent bowel sounds, NG tube in place with bilious output EXT: Diffuse anasarca improving NEURO: He moves all 4 extremities to command PSYCH: RASS 0, good insight SKIN: No rashes, right IJ HD catheter clean dry and intact  Labs look good Pct 2.5  Resolved Hospital Problem list     Assessment & Plan:   Acute renal failure Multifactorial related to dehydration poor renal perfusion, diuretics, ARB and previous NSAID use. -Continue CRRT per nephrology with goal to add back some fluid, avoid nephrotoxins  Morbid obesity, sleep apnea, likely obesity hypoventilation syndrome. - We will try to avoid BiPAP in the setting of significant ileus  Ileus- related to renal failure - NG to suction: 5L past 24h - Trial of reglan ongoing - Surgery  following: recommends TPN today  Shock- unclear etiology, ? sepsis hx HTN.  Possible UTI - Continue pressors - Ceftriaxone x 7 days  Paroxysmal atrial fibrillation -Continue IV heparin - Monitor HR, may need to use amiodarone  History of chronic systolic heart failure Plan: Repeat echo terrible windows not unexpected, EF looked grossly preserved  Type 2 diabetes,  hyperglycemia Continue SSI with CBGs.   Best practice:  Diet: to start TPN Pain/Anxiety/Delirium protocol (if indicated): Not applicable VAP protocol (if indicated): Not applicable DVT prophylaxis: heparin infusion GI prophylaxis: None Glucose control: SSI Mobility: BR Code Status: FULL Family Communication: updated patient  Disposition: ICU  The patient is critically ill with multiple organ systems failure and requires high complexity decision making for assessment and support, frequent evaluation and titration of therapies, application of advanced monitoring technologies and extensive interpretation of multiple databases. Critical Care Time devoted to patient care services described in this note independent of APP/resident time (if applicable)  is 33 minutes.   Myrla Halsted MD [facility] 10/24/2019 9:04 AM Personal pager: [phone] If unanswered, please page CCM On-call: [phone]

## 2019-10-24 NOTE — Progress Notes (Signed)
Chaplain engaged in initial visit with Douglas [name] and his wife.  Chaplain explained spiritual support services.  Douglas [name] requested prayer.  Chaplain, wife and Douglas [name] prayed together surrounding him.  Chaplain will continue to follow-up.

## 2019-10-24 NOTE — Progress Notes (Signed)
Patient ID: Douglas [name], male   DOB: 10-10-1970, 49 y.o.   MRN: [id number] S: remains on pressors and CRRT  - no UOP CVP reading as low-  Alert NAD-  No UOP- still llots of NGT output    O:BP 94/70   Pulse (!) 110   Temp 97.8 F (36.6 C) (Oral)   Resp 19   Ht 6' (1.829 m)   Wt (!) 181 kg   SpO2 98%   BMI 54.11 kg/m   Intake/Output Summary (Last 24 hours) at 10/24/2019 4259 Last data filed at 10/24/2019 0700 Gross per 24 hour  Intake 4485.16 ml  Output 5332 ml  Net -846.84 ml   Intake/Output: I/O last 3 completed shifts: In: 8593.4 [I.V.:7893.4; IV Piggyback:700] Out: 56387 [Urine:10; Emesis/NG output:9550; Other:905]  Intake/Output this shift:  No intake/output data recorded. Weight change: -5.443 kg Gen: obese AAM lying in bed in NAD-  Right IJ vascath - placed 5/8 CVS: tachy at 104 Resp: diminished BS Abd: obese, distended, soft, NT Ext: trace presacral edema vs obesity  Recent Labs  Lab 10/20/19 2037 10/21/19 0443 10/22/19 0220 10/22/19 1610 10/23/19 0321 10/23/19 1550 10/24/19 0358  NA 137 136 144 147* 145 144 142  K 4.5 4.2 4.2 4.0 4.2 3.8 4.2  CL 100 102 97* 94* 94* 96* 96*  CO2 22 20* 24 29 30 28 26   GLUCOSE 124* 138* 118* 113* 115* 114* 114*  BUN 38* 46* 69* 86* 70* 53* 43*  CREATININE 2.52* 3.24* 6.25* 8.26* 6.97* 5.63* 5.03*  ALBUMIN 3.3*  --  2.9*  --  3.0* 2.9* 2.9*  CALCIUM 9.8 9.4 9.5 9.3 9.3 9.1 9.4  PHOS  --   --  6.7*  --  6.0* 4.8* 4.1  AST 26  --   --   --  25  --   --   ALT 39  --   --   --  37  --   --    Liver Function Tests: Recent Labs  Lab 10/20/19 2037 10/22/19 0220 10/23/19 0321 10/23/19 1550 10/24/19 0358  AST 26  --  25  --   --   ALT 39  --  37  --   --   ALKPHOS 67  --  66  --   --   BILITOT 1.7*  --  1.2  --   --   PROT 7.9  --  8.6*  --   --   ALBUMIN 3.3*   < > 3.0* 2.9* 2.9*   < > = values in this interval not displayed.   No results for input(s): LIPASE, AMYLASE in the last 168 hours. No results for  input(s): AMMONIA in the last 168 hours. CBC: Recent Labs  Lab 10/20/19 2037 10/20/19 2037 10/21/19 0443 10/21/19 0443 10/22/19 0220 10/23/19 0321 10/24/19 0358  WBC 9.7   < > 10.3   < > 6.4 10.2 10.4  NEUTROABS 7.5  --   --   --   --  6.6  --   HGB 13.7   < > 13.1   < > 12.7* 13.2 13.5  HCT 44.0   < > 41.6   < > 39.9 41.5 43.5  MCV 88.7  --  87.6  --  86.7 88.9 91.8  PLT 695*   < > 671*   < > 617* 648* 588*   < > = values in this interval not displayed.   Cardiac Enzymes: Recent Labs  Lab 10/21/19 1113  CKTOTAL 344   CBG: Recent Labs  Lab 10/23/19 0644 10/23/19 1124 10/23/19 1535 10/23/19 2104 10/24/19 0639  GLUCAP 128* 101* 96 105* 101*    Iron Studies: No results for input(s): IRON, TIBC, TRANSFERRIN, FERRITIN in the last 72 hours. Studies/Results: US RENAL  Result Date: 10/22/2019 CLINICAL DATA:  Acute kidney injury EXAM: RENAL / URINARY TRACT ULTRASOUND COMPLETE COMPARISON:  Oct 21, 2019 FINDINGS: Right Kidney: Renal measurements: 9.2 x 3.9 x 5.1 cm = volume: 95 mL . Echogenicity within normal limits. No mass or hydronephrosis visualized. Left Kidney: Renal measurements: 8.1 x 5.5 x 5 = volume: 116 mL. Echogenicity within normal limits. No mass or hydronephrosis visualized. Bladder: The bladder is decompressed with a Foley catheter which limits evaluation. Other: Overall evaluation of the kidneys was limited by patient body habitus. IMPRESSION: 1. Habitus limited study. 2. No definite acute abnormality given the above limitation. No evidence for hydronephrosis. 3. Bladder decompressed with a Foley catheter which limits evaluation. Electronically Signed   By: Katherine Mantle M.D.   On: 10/22/2019 16:48   DG Chest Port 1 View  Result Date: 10/22/2019 CLINICAL DATA:  Encounter for central line placement. EXAM: PORTABLE CHEST 1 VIEW COMPARISON:  Oct 21, 2019 FINDINGS: There is a newly placed non tunneled dialysis catheter on the right. The tip terminates over the  cavoatrial junction. The enteric tube tip is not visualized on this study. The lung volumes are low. The heart size is enlarged. There is vascular congestion with pulmonary edema. There is no pneumothorax. IMPRESSION: 1. Well-positioned right-sided non tunneled dialysis catheter. No pneumothorax. 2. Cardiomegaly with persistent pulmonary edema. 3. Low lung volumes. Electronically Signed   By: Katherine Mantle M.D.   On: 10/22/2019 18:04   ECHOCARDIOGRAM COMPLETE  Result Date: 10/22/2019    ECHOCARDIOGRAM REPORT   Patient Name:   Douglas [name] [facility] Date of Exam: 10/22/2019 Medical Rec #:  [id number]              Height:       72.0 in Accession #:    [id number]             Weight:       410.0 lb Date of Birth:  Oct 06, 1970              BSA:          2.891 m Patient Age:    48 years               BP:           87/54 mmHg Patient Gender: M                      HR:           114 bpm. Exam Location:  Inpatient Procedure: 2D Echo, Cardiac Doppler and Color Doppler Indications:    Dyspnea  History:        Patient has no prior history of Echocardiogram examinations.                 Signs/Symptoms:Dyspnea; Risk Factors:Diabetes, Hypertension and                 Morbid obesity.  Sonographer:    Lavenia Atlas Referring Phys: Herminio Heads  Sonographer Comments: No apical window, no subcostal window and patient is morbidly obese. Image acquisition challenging due to patient body habitus. IMPRESSIONS  1. Technically difficult echo with poor image quality.  2. Left ventricular ejection  fraction, by estimation, is 50 to 55%. The left ventricle has low normal function. The left ventricle has no regional wall motion abnormalities. There is moderate left ventricular hypertrophy. Left ventricular diastolic function could not be evaluated.  3. Right ventricular systolic function was not well visualized. The right ventricular size is not well visualized.  4. The mitral valve is grossly normal. No evidence of mitral valve  regurgitation. No evidence of mitral stenosis.  5. The aortic valve is grossly normal. Aortic valve regurgitation is not visualized. No aortic stenosis is present. FINDINGS  Left Ventricle: Left ventricular ejection fraction, by estimation, is 50 to 55%. The left ventricle has low normal function. The left ventricle has no regional wall motion abnormalities. The left ventricular internal cavity size was normal in size. There is moderate left ventricular hypertrophy. Left ventricular diastolic function could not be evaluated. Right Ventricle: The right ventricular size is not well visualized. Right vetricular wall thickness was not assessed. Right ventricular systolic function was not well visualized. Left Atrium: Left atrial size was normal in size. Right Atrium: Right atrial size was not well visualized. Pericardium: There is no evidence of pericardial effusion. Mitral Valve: The mitral valve is grossly normal. No evidence of mitral valve regurgitation. No evidence of mitral valve stenosis. Tricuspid Valve: The tricuspid valve is not well visualized. Tricuspid valve regurgitation is not demonstrated. Aortic Valve: The aortic valve is grossly normal. Aortic valve regurgitation is not visualized. No aortic stenosis is present. Pulmonic Valve: The pulmonic valve was normal in structure. Pulmonic valve regurgitation is not visualized. Aorta: The aortic root and ascending aorta are structurally normal, with no evidence of dilitation. IAS/Shunts: The interatrial septum was not well visualized. Additional Comments: Technically difficult echo with poor image quality.  LEFT VENTRICLE PLAX 2D LVIDd:         4.95 cm LVIDs:         3.76 cm LV PW:         1.48 cm LV IVS:        1.30 cm LVOT diam:     2.60 cm LVOT Area:     5.31 cm  LEFT ATRIUM         Index LA diam:    4.00 cm 1.38 cm/m   AORTA Ao Root diam: 2.70 cm  SHUNTS Systemic Diam: 2.60 cm Kristeen Miss MD Electronically signed by Kristeen Miss MD Signature Date/Time:  10/22/2019/3:28:20 PM    Final    . chlorhexidine  15 mL Mouth Rinse BID  . Chlorhexidine Gluconate Cloth  6 each Topical Q0600  . insulin aspart  0-9 Units Subcutaneous TID WC  . mouth rinse  15 mL Mouth Rinse q12n4p  . metoCLOPramide (REGLAN) injection  10 mg Intravenous Q6H  . metoprolol tartrate  5 mg Intravenous Q8H  . mometasone-formoterol  2 puff Inhalation BID  . pantoprazole (PROTONIX) IV  40 mg Intravenous Q24H  . polyethylene glycol  17 g Oral BID  . senna-docusate  1 tablet Oral BID  . sertraline  50 mg Oral Daily  . sodium chloride flush  10-40 mL Intracatheter Q12H    BMET    Component Value Date/Time   NA 142 10/24/2019 0358   K 4.2 10/24/2019 0358   CL 96 (L) 10/24/2019 0358   CO2 26 10/24/2019 0358   GLUCOSE 114 (H) 10/24/2019 0358   BUN 43 (H) 10/24/2019 0358   CREATININE 5.03 (H) 10/24/2019 0358   CALCIUM 9.4 10/24/2019 0358   GFRNONAA 12 (L) 10/24/2019  0358   GFRAA 14 (L) 10/24/2019 0358   CBC    Component Value Date/Time   WBC 10.4 10/24/2019 0358   RBC 4.74 10/24/2019 0358   HGB 13.5 10/24/2019 0358   HCT 43.5 10/24/2019 0358   PLT 588 (H) 10/24/2019 0358   MCV 91.8 10/24/2019 0358   MCH 28.5 10/24/2019 0358   MCHC 31.0 10/24/2019 0358   RDW 14.8 10/24/2019 0358   LYMPHSABS 1.6 10/23/2019 0321   MONOABS 1.9 (H) 10/23/2019 0321   EOSABS 0.0 10/23/2019 0321   BASOSABS 0.1 10/23/2019 0321    Assessment/Plan: 1. AKI- presumably due to ischemic ATN in setting of hypotension, volume depletion, acute illness, as well as NSAID use and concomitant ARB therapy. Also concerning for bladder outlet obstruction -butCT scan did not show evidence of. His serum creatinine was normal a week ago. 1. Continue to hold ARB and lasix 2. Avoid all NSAIDs and COX-II I's 3. CT of abdomen without evidence of obstruction 4. Foley catheter in place 5. Progressive AKI, oliguric likely due to worsening ischemic ATN in setting of large amounts of NG suction (almost 10  liters) and ongoing hypotension 6. Started CRRT on 10/22/19 due to ongoing hypotension and progressive AKI tolerating it well 1. Pre-filter 4K/2.5Ca at 500 ml/hr, post-filter 4K/2.5Ca at 300 ml/hr, Dialysate 4K/2.5Ca at 1800, UF goal to keep even to -50 ml/hr, no heparin 2. Acute hypoxic respiratory distress- possible aspiration.   Markedly improved this morning, do not think volume overloaded 3. Bilateral knee injuries with tendon damage as well as menisci. Ortho evaluating but injury occurred 4 weeks ago and his super morbid obesity, AKI, and ileus vs. SBO will delay intervention. 4. N/V/abdominal distension- abd series with dilated loops of bowel, possible ileus vs. SBO. Surgery consulted and CT scan supports severe ileus.   1. S/p NGT with large amounts of output (over 10 liters in 24 hours) 2. Starting to pass flatus 3. Continue NGT 5. Atrial fibrillation 6. HTN- hold BP meds now that he is hypotensive. Give fluid back -  With low CVP/hypotension and tachy-  Plan for 2-3 liters positive today   7. DM- per primary  8. Debility- pt unable to stand or walk due to obesity and knee injuries.  Louis Meckel  [employer] [phone]

## 2019-10-24 NOTE — Progress Notes (Signed)
ANTICOAGULATION CONSULT NOTE  Pharmacy Consult for heparin Indication: Afib  Allergies  Allergen Reactions  . Benazepril Anaphylaxis  . Betadine [Povidone Iodine]     rash  . Prednisone Other (See Comments)    Headache.    Patient Measurements: Height: 6' (182.9 cm) Weight: (!) 181 kg (399 lb) IBW/kg (Calculated) : 77.6 Heparin Dosing Weight: 123kg  Vital Signs: Temp: 98.1 F (36.7 C) (05/10 0720) Temp Source: Oral (05/10 0700) BP: 144/83 (05/10 0800) Pulse Rate: 94 (05/10 0800)  Labs: Recent Labs     0000 10/21/19 1113 10/22/19 0220 10/22/19 1610 10/23/19 0321 10/23/19 0321 10/23/19 1210 10/23/19 1550 10/23/19 1956 10/24/19 0358  HGB   < >  --  12.7*  --  13.2  --   --   --   --  13.5  HCT  --   --  39.9  --  41.5  --   --   --   --  43.5  PLT  --   --  617*  --  648*  --   --   --   --  588*  APTT  --   --   --   --  37*   < > 50*  --  78* 103*  HEPARINUNFRC  --   --   --   --  1.24*  --   --   --   --   --   CREATININE  --   --  6.25*   < > 6.97*  --   --  5.63*  --  5.03*  CKTOTAL  --  344  --   --   --   --   --   --   --   --    < > = values in this interval not displayed.    Estimated Creatinine Clearance: 29.6 mL/min (A) (by C-G formula based on SCr of 5.03 mg/dL (H)).  Assessment: 49 yo male with h/o AFib, Eliquis on hold, or heparin.   This morning aPTT came back supratherapeutic at 103, on 2400 units/hr. Hgb 13.5, plt 588. No s/sx of bleeding or infusion issues.  Goal of Therapy:  Heparin level 0.3-0.7 units/ml aPTT 66-102 seconds Monitor platelets by anticoagulation protocol: Yes   Plan:  Reduce heparin infusion to 2300 units/hr Repeat aPTT in 7 hours Monitor HL, CBC, and for s/sx of bleeding   Sherron Monday, PharmD, BCCCP Clinical Pharmacist  Phone: [phone] 10/24/2019 8:40 AM  Please check AMION for all [facility] Pharmacy phone numbers After 10:00 PM, call Main Pharmacy [phone]

## 2019-10-24 NOTE — Progress Notes (Addendum)
PHARMACY - TOTAL PARENTERAL NUTRITION CONSULT NOTE   Indication: Prolonged ileus  Patient Measurements: Height: 6' (182.9 cm) Weight: (!) 181 kg (399 lb) IBW/kg (Calculated) : 77.6 TPN AdjBW (KG): 104.7 Body mass index is 54.11 kg/m.   Assessment: This is a 49 year old gentleman history of diabetes hypertension came to the hospital after mechanical fall which occurred several weeks ago.  Patient was found to have acute renal failure.  And concern for SBO versus ileus.  NG tube was placed for decompression with ongoing high output. Patient on CRRT. Pharmacy consulted for TPN.  Glucose / Insulin: Glucose WNL on sensitive SSI, not requiring any insulin presently Electrolytes: K 4.2, Mag 2.7, Phos 4.1 Renal: Scr 5.03, BUN 43 LFTs / TGs: will draw tomorrow Prealbumin / albumin: Albumin 2.9 Intake / Output; MIVF: no UOP, NG output 5500 mls GI Imaging: 5/10 Abd Xray - Continued small bowel dilatation is noted concerning for distal small bowel obstruction or ileus.  Surgeries / Procedures:   Central access: Central line being placed by CCM 5/10 TPN start date: 10/24/19  Nutritional Goals (per RD recommendation on 5/7): Kcal:  2200-2400; Protein:  125-160 grams; Fluid:  > 2.2 L - will minimize fluids given CRRT and AKI Goal TPN rate is 80 mL/hr (provides 130 g of protein and 2200 kcals per day)  Current Nutrition:  NPO  Plan:  Start TPN at 40 mL/hr at 1800 Will hold off lipids for the first 7 days while in ICU - reassess 5/17 Electrolytes in TPN: 77mEq/L of Na, only due to CRRT. Cl:Ac ratio 1:1 - may need to add back electrolytes as removed by CRRT Add standard MVI and trace elements to TPN Initiate Sensitive q6h SSI and adjust as needed  Reduce MIVF to 100 mL/hr at 1800 Monitor TPN labs in the am and on Mon/Thurs  ADDENDUM:  Unable to obtain central IV Access will not start TPN today. F/u on access on Tuesday  Jeanella Cara, PharmD, [facility] Clinical Pharmacist Please see AMION  for all Pharmacists' Contact Phone Numbers 10/24/2019, 9:33 AM

## 2019-10-24 NOTE — Progress Notes (Signed)
ANTICOAGULATION CONSULT NOTE  Pharmacy Consult for heparin Indication: Afib  Allergies  Allergen Reactions  . Benazepril Anaphylaxis  . Betadine [Povidone Iodine]     rash  . Prednisone Other (See Comments)    Headache.    Patient Measurements: Height: 6' (182.9 cm) Weight: (!) 181 kg (399 lb) IBW/kg (Calculated) : 77.6 Heparin Dosing Weight: 123kg  Vital Signs: Temp: 98.1 F (36.7 C) (05/10 1506) Temp Source: Oral (05/10 0700) BP: 96/59 (05/10 1800) Pulse Rate: 113 (05/10 1800)  Labs: Recent Labs    10/22/19 0220 10/22/19 1610 10/23/19 0321 10/23/19 1210 10/23/19 1550 10/23/19 1956 10/24/19 0358 10/24/19 1652  HGB 12.7*  --  13.2  --   --   --  13.5  --   HCT 39.9  --  41.5  --   --   --  43.5  --   PLT 617*  --  648*  --   --   --  588*  --   APTT  --   --  37*   < >  --  78* 103* 109*  HEPARINUNFRC  --   --  1.24*  --   --   --   --   --   CREATININE 6.25*   < > 6.97*  --  5.63*  --  5.03* 4.13*   < > = values in this interval not displayed.    Estimated Creatinine Clearance: 36 mL/min (A) (by C-G formula based on SCr of 4.13 mg/dL (H)).  Assessment: 49 yo male with h/o AFib, Eliquis on hold, or heparin.  -aPTT =109 on 2400 units/hr.   Goal of Therapy:  Heparin level 0.3-0.7 units/ml aPTT 66-102 seconds Monitor platelets by anticoagulation protocol: Yes   Plan:  Reduce heparin infusion to 2200 units/hr Monitor daily HL, CBC, and for s/sx of bleeding   Harland German, PharmD Clinical Pharmacist **Pharmacist phone directory can now be found on [email or web address] ([email or web address]).  Listed under [facility] Pharmacy.

## 2019-10-25 DIAGNOSIS — E119 Type 2 diabetes mellitus without complications: Secondary | ICD-10-CM

## 2019-10-25 LAB — CBC
HCT: 39 % (ref 39.0–52.0)
Hemoglobin: 11.5 g/dL — ABNORMAL LOW (ref 13.0–17.0)
MCH: 27.8 pg (ref 26.0–34.0)
MCHC: 29.5 g/dL — ABNORMAL LOW (ref 30.0–36.0)
MCV: 94.2 fL (ref 80.0–100.0)
Platelets: 463 10*3/uL — ABNORMAL HIGH (ref 150–400)
RBC: 4.14 MIL/uL — ABNORMAL LOW (ref 4.22–5.81)
RDW: 14.9 % (ref 11.5–15.5)
WBC: 14.2 10*3/uL — ABNORMAL HIGH (ref 4.0–10.5)
nRBC: 0.1 % (ref 0.0–0.2)

## 2019-10-25 LAB — DIFFERENTIAL
Abs Immature Granulocytes: 0.23 10*3/uL — ABNORMAL HIGH (ref 0.00–0.07)
Basophils Absolute: 0.1 10*3/uL (ref 0.0–0.1)
Basophils Relative: 1 %
Eosinophils Absolute: 0.5 10*3/uL (ref 0.0–0.5)
Eosinophils Relative: 4 %
Immature Granulocytes: 2 %
Lymphocytes Relative: 15 %
Lymphs Abs: 2.1 10*3/uL (ref 0.7–4.0)
Monocytes Absolute: 2.6 10*3/uL — ABNORMAL HIGH (ref 0.1–1.0)
Monocytes Relative: 18 %
Neutro Abs: 8.6 10*3/uL — ABNORMAL HIGH (ref 1.7–7.7)
Neutrophils Relative %: 60 %

## 2019-10-25 LAB — RENAL FUNCTION PANEL
Albumin: 2.2 g/dL — ABNORMAL LOW (ref 3.5–5.0)
Anion gap: 10 (ref 5–15)
BUN: 27 mg/dL — ABNORMAL HIGH (ref 6–20)
CO2: 25 mmol/L (ref 22–32)
Calcium: 8.8 mg/dL — ABNORMAL LOW (ref 8.9–10.3)
Chloride: 105 mmol/L (ref 98–111)
Creatinine, Ser: 3.19 mg/dL — ABNORMAL HIGH (ref 0.61–1.24)
GFR calc Af Amer: 25 mL/min — ABNORMAL LOW (ref >60)
GFR calc non Af Amer: 21 mL/min — ABNORMAL LOW (ref >60)
Glucose, Bld: 94 mg/dL (ref 70–99)
Phosphorus: 2.5 mg/dL (ref 2.5–4.6)
Potassium: 3.8 mmol/L (ref 3.5–5.1)
Sodium: 140 mmol/L (ref 135–145)

## 2019-10-25 LAB — COMPREHENSIVE METABOLIC PANEL
ALT: 31 U/L (ref 0–44)
AST: 26 U/L (ref 15–41)
Albumin: 2.4 g/dL — ABNORMAL LOW (ref 3.5–5.0)
Alkaline Phosphatase: 59 U/L (ref 38–126)
Anion gap: 13 (ref 5–15)
BUN: 30 mg/dL — ABNORMAL HIGH (ref 6–20)
CO2: 25 mmol/L (ref 22–32)
Calcium: 9 mg/dL (ref 8.9–10.3)
Chloride: 103 mmol/L (ref 98–111)
Creatinine, Ser: 3.66 mg/dL — ABNORMAL HIGH (ref 0.61–1.24)
GFR calc Af Amer: 21 mL/min — ABNORMAL LOW (ref >60)
GFR calc non Af Amer: 18 mL/min — ABNORMAL LOW (ref >60)
Glucose, Bld: 96 mg/dL (ref 70–99)
Potassium: 3.7 mmol/L (ref 3.5–5.1)
Sodium: 141 mmol/L (ref 135–145)
Total Bilirubin: 1.2 mg/dL (ref 0.3–1.2)
Total Protein: 7.3 g/dL (ref 6.5–8.1)

## 2019-10-25 LAB — PHOSPHORUS: Phosphorus: 3.1 mg/dL (ref 2.5–4.6)

## 2019-10-25 LAB — TRIGLYCERIDES: Triglycerides: 146 mg/dL (ref <150)

## 2019-10-25 LAB — GLUCOSE, CAPILLARY
Glucose-Capillary: 72 mg/dL (ref 70–99)
Glucose-Capillary: 76 mg/dL (ref 70–99)
Glucose-Capillary: 78 mg/dL (ref 70–99)
Glucose-Capillary: 83 mg/dL (ref 70–99)
Glucose-Capillary: 94 mg/dL (ref 70–99)
Glucose-Capillary: 99 mg/dL (ref 70–99)

## 2019-10-25 LAB — APTT: aPTT: 68 seconds — ABNORMAL HIGH (ref 24–36)

## 2019-10-25 LAB — HEPARIN LEVEL (UNFRACTIONATED): Heparin Unfractionated: 0.56 IU/mL (ref 0.30–0.70)

## 2019-10-25 LAB — PREALBUMIN: Prealbumin: 14.1 mg/dL — ABNORMAL LOW (ref 18–38)

## 2019-10-25 LAB — MAGNESIUM: Magnesium: 2.4 mg/dL (ref 1.7–2.4)

## 2019-10-25 MED ORDER — POTASSIUM CHLORIDE 10 MEQ/50ML IV SOLN
10.0000 meq | INTRAVENOUS | Status: AC
  Administered 2019-10-25 (×4): 10 meq via INTRAVENOUS
  Filled 2019-10-25 (×4): qty 50

## 2019-10-25 NOTE — Progress Notes (Signed)
BIPAP held at this time per CCM due to concern for ileus.  Patient currently on 2L [place] with Spo2 of 100%.  Per RN, patient aware and in agreement of using [place] for tonight.

## 2019-10-25 NOTE — Progress Notes (Signed)
PHARMACY - TOTAL PARENTERAL NUTRITION CONSULT NOTE   Indication: Prolonged ileus  Patient Measurements: Height: 6' (182.9 cm) Weight: (!) 186 kg (410 lb) IBW/kg (Calculated) : 77.6 TPN AdjBW (KG): 104.7 Body mass index is 55.61 kg/m.   Assessment: This is a 49 year old gentleman history of diabetes hypertension came to the hospital after mechanical fall which occurred several weeks ago.  Patient was found to have acute renal failure.  And concern for SBO versus ileus.  NG tube was placed for decompression with ongoing high output. Patient on CRRT. Pharmacy consulted for TPN.  Nutritional Goals (per RD recommendation on 5/7): Kcal:  2200-2400; Protein:  125-160 grams; Fluid:  > 2.2 L - will minimize fluids given CRRT and AKI Goal TPN rate is 80 mL/hr (provides 130 g of protein and 2200 kcals per day)  Current Nutrition:  NPO  Plan:  Per discussion with CCM, will hold off on TPN again today and monitor, as unable to obtain central IV access and patient improving (having BMs, NG output significantly decreased, potential to challenge with PO soon) F/u nutrition and central access plans on 5/12   Leia Alf, PharmD, BCPS Please check AMION for all [facility] Pharmacy contact numbers Clinical Pharmacist 10/25/2019 9:01 AM

## 2019-10-25 NOTE — Progress Notes (Signed)
Patient ID: Epic Tribbett, male   DOB: 1970-12-29, 49 y.o.   MRN: [id number]       Subjective: Patient feels much better today.  Had a couple of small BMs yesterday after suppository and had a bigger liquid stool this am.  NGT output is decreasing significantly.  Still had 1200 out yesterday, but only 250cc out overnight.  ROS: See above, otherwise other systems negative  Objective: Vital signs in last 24 hours: Temp:  [97.5 F (36.4 C)-98.3 F (36.8 C)] 98.3 F (36.8 C) (05/11 0733) Pulse Rate:  [73-114] 108 (05/11 0700) Resp:  [17-28] 22 (05/11 0700) BP: (74-130)/(38-96) 124/57 (05/11 0700) SpO2:  [96 %-100 %] 97 % (05/11 0808) Weight:  [186 kg] 186 kg (05/11 0440) Last BM Date: 10/22/19  Intake/Output from previous day: 05/10 0701 - 05/11 0700 In: 5777 [I.V.:5676.9; IV Piggyback:100.1] Out: 1415 [Emesis/NG output:1450] Intake/Output this shift: No intake/output data recorded.  PE: Abd: soft, obese, some BS present, NT  Lab Results:  Recent Labs    10/24/19 0358 10/25/19 0354  WBC 10.4 14.2*  HGB 13.5 11.5*  HCT 43.5 39.0  PLT 588* 463*   BMET Recent Labs    10/24/19 1652 10/25/19 0354  NA 142 141  K 3.9 3.7  CL 104 103  CO2 24 25  GLUCOSE 112* 96  BUN 34* 30*  CREATININE 4.13* 3.66*  CALCIUM 9.1 9.0   PT/INR No results for input(s): LABPROT, INR in the last 72 hours. CMP     Component Value Date/Time   NA 141 10/25/2019 0354   K 3.7 10/25/2019 0354   CL 103 10/25/2019 0354   CO2 25 10/25/2019 0354   GLUCOSE 96 10/25/2019 0354   BUN 30 (H) 10/25/2019 0354   CREATININE 3.66 (H) 10/25/2019 0354   CALCIUM 9.0 10/25/2019 0354   PROT 7.3 10/25/2019 0354   ALBUMIN 2.4 (L) 10/25/2019 0354   AST 26 10/25/2019 0354   ALT 31 10/25/2019 0354   ALKPHOS 59 10/25/2019 0354   BILITOT 1.2 10/25/2019 0354   GFRNONAA 18 (L) 10/25/2019 0354   GFRAA 21 (L) 10/25/2019 0354   Lipase  No results found for: LIPASE     Studies/Results: DG Abd  Portable 1V  Result Date: 10/24/2019 CLINICAL DATA:  Nasogastric tube present. EXAM: PORTABLE ABDOMEN - 1 VIEW COMPARISON:  Oct 21, 2019. FINDINGS: Continued small bowel dilatation is noted. Distal tip of nasogastric tube is seen in proximal stomach. No radio-opaque calculi or other significant radiographic abnormality are seen. IMPRESSION: Distal tip of nasogastric tube seen in proximal stomach. Continued small bowel dilatation is noted concerning for distal small bowel obstruction or ileus. Electronically Signed   By: Marijo Conception M.D.   On: 10/24/2019 08:09    Anti-infectives: Anti-infectives (From admission, onward)   Start     Dose/Rate Route Frequency Ordered Stop   10/23/19 0000  cefTRIAXone (ROCEPHIN) 1 g in sodium chloride 0.9 % 100 mL IVPB     1 g 200 mL/hr over 30 Minutes Intravenous Every 24 hours 10/22/19 1357 10/30/19 2159       Assessment/Plan ARF- on CRRT Knee injury DM CHF HTN A fib  Ileus -cont NGT for gastric decompression given high output, but this is decreasing quite a bit.  If this continues to decrease today, likely plan for removal tomorrow. -mobilize as able, although unlikely currently while on CRRT and difficult given knee injury. -electrolytes stable, K 3.7.  Wound benefit from a little more K to keep  over 4 for bowel function -start TNA today for nutritional support if good vein present.  If not, given improvement overall in abdominal situation, would hold off.  FEN - NPO/NGT/ start TNA today for nutritional support if able, if not will hold off VTE - heparin for a fib ID - none   LOS: 5 days    Letha Cape , [facility] 10/25/2019, 8:29 AM Please see Amion for pager number during day hours 7:00am-4:30pm or 7:00am -11:30am on weekends

## 2019-10-25 NOTE — Progress Notes (Signed)
ANTICOAGULATION CONSULT NOTE  Pharmacy Consult for heparin Indication: Afib  Allergies  Allergen Reactions  . Benazepril Anaphylaxis  . Betadine [Povidone Iodine]     rash  . Prednisone Other (See Comments)    Headache.    Patient Measurements: Height: 6' (182.9 cm) Weight: (!) 186 kg (410 lb) IBW/kg (Calculated) : 77.6 Heparin Dosing Weight: 123kg  Vital Signs: Temp: 98.3 F (36.8 C) (05/11 0733) Temp Source: Oral (05/11 0733) BP: 107/65 (05/11 0903) Pulse Rate: 106 (05/11 0950)  Labs: Recent Labs    10/23/19 0321 10/23/19 1210 10/24/19 0358 10/24/19 1652 10/25/19 0354  HGB 13.2  --  13.5  --  11.5*  HCT 41.5  --  43.5  --  39.0  PLT 648*  --  588*  --  463*  APTT 37*   < > 103* 109* 68*  HEPARINUNFRC 1.24*  --   --   --  0.56  CREATININE 6.97*   < > 5.03* 4.13* 3.66*   < > = values in this interval not displayed.    Estimated Creatinine Clearance: 41.3 mL/min (A) (by C-G formula based on SCr of 3.66 mg/dL (H)).  Assessment: 49 yo male with h/o AFib, Eliquis on hold, on heparin.    Heparin level is therapeutic at 0.56, aPTT 68 (low end of goal range). Hgb 11.5, plt 462. No s/sx of bleeding or infusion issues.  Goal of Therapy:  Heparin level 0.3-0.7 units/ml aPTT 66-102 seconds Monitor platelets by anticoagulation protocol: Yes   Plan:  Continue heparin infusion at 2200 units/hr Monitor daily HL, CBC, and for s/sx of bleeding   Sherron Monday, PharmD, BCCCP Clinical Pharmacist  Phone: [phone] 10/25/2019 10:04 AM  Please check AMION for all [facility] Pharmacy phone numbers After 10:00 PM, call Main Pharmacy [phone]

## 2019-10-25 NOTE — Progress Notes (Signed)
NAME:  Douglas [name], MRN:  [id number], DOB:  [birth year], LOS: 5 ADMISSION DATE:  10/20/2019, CONSULTATION DATE: 10/22/2019 REFERRING MD: Dr. Blake Divine, Dr. Abel Presto, CHIEF COMPLAINT:  Renal failure, SOB   Brief History   49 year old male, acute renal failure, oliguria, I see admission for CVVHD and hypotension.  History of present illness   This is a 49 year old gentleman history of diabetes hypertension came to the hospital after mechanical fall which occurred several weeks ago.  Patient was found to have acute renal failure.  And concern for SBO versus ileus.  NG tube was placed.  A rapid response occurred the night of 10/21/2019.  He had a significant amount of NG output.  He was also given 1 L fluid bolus.  Patient was seen by surgery for ileus.  Recommending continued NG tube decompression.  Echocardiogram was completed today.  Renal ultrasound pending.  Patient had worsening kidney function, initial serum creatinine of 2.5 up to 3.24.  Now serum creatinine 6.25 with a serum bicarb of 24.  Nephrology was consulted for evaluation.  Due to low blood pressures and very little urine output PCCM was consulted for recommendations transferred to the intensive care unit for close observation and likely initiation of CVVHD.  Patient does have history of paroxysmal atrial fibrillation.  Was on Eliquis PTA.  Switch to heparin infusion.  Past Medical History   Past Medical History:  Diagnosis Date  . CHF (congestive heart failure) (HCC)   . Degenerative arthritis   . Diabetes mellitus without complication (HCC)   . Gout      Significant Hospital Events   None   Consults:  Pulmonary critical care Nephrology Pharmacy Surgery  Procedures:  none  Significant Diagnostic Tests:  Previous echocardiogram EF 45%  10/22/2019 echo: Pending Renal ultrasound 10/22/2019: Pending  Micro Data:  10/20/2019: Covid negative influenza PCR negative  Antimicrobials:  Ceftriaxone 5/8>> ?7 days  planned).  Interim history/subjective:  Small BM this AM. Less NGT output.  Objective   Blood pressure (!) 87/62, pulse (!) 51, temperature 98.3 F (36.8 C), temperature source Oral, resp. rate (!) 24, height 6' (1.829 m), weight (!) 186 kg, SpO2 97 %. CVP:  [3 mmHg-5 mmHg] 5 mmHg      Intake/Output Summary (Last 24 hours) at 10/25/2019 0835 Last data filed at 10/25/2019 0800 Gross per 24 hour  Intake 5811.16 ml  Output 898 ml  Net 4913.16 ml   Filed Weights   10/24/19 0000 10/24/19 0406 10/25/19 0440  Weight: (!) 180.5 kg (!) 181 kg (!) 186 kg    Examination: GEN: Overweight man in no acute distress HEENT: Mallampati 4, trachea midline CV: Heart sounds are regular, extremities are warm PULM: Diminished secondary to body habitus, no wheezing GI: Protuberant, hypoactive bowel sounds, NG tube in place with bilious output EXT: no edema  NEURO: He moves all 4 extremities to command PSYCH: RASS 0, good insight SKIN: No rashes, right IJ HD catheter clean dry and intact  Labs look good Pct 2.5 CBGs okay  Resolved Hospital Problem list     Assessment & Plan:   Acute renal failure Multifactorial related to dehydration poor renal perfusion, diuretics, ARB and previous NSAID use. -Continue CRRT per nephrology with goal to add back some fluid, avoid nephrotoxins  Morbid obesity, sleep apnea, likely obesity hypoventilation syndrome. - We will try to avoid BiPAP in the setting of significant ileus  Ileus- related to renal failure; starting to improve - NG to suction:  past 24h output  has been decreased - Trial of reglan ongoing - General surgery following - LIJ is diminutive still, continue rehydration, potentially can challenge with PO soon  Shock- improved with rehydration and abx - Ceftriaxone x 7 days  Paroxysmal atrial fibrillation- looks like he's back in sinus - Continue IV heparin  History of chronic systolic heart failure - Repeat echo terrible windows not  unexpected, EF looked grossly preserved  Type 2 diabetes, hyperglycemia - Continue SSI with CBGs.  Bad knees- needs repair at some point, ortho following peripherally  Best practice:  Diet: on hold Pain/Anxiety/Delirium protocol (if indicated): Not applicable VAP protocol (if indicated): Not applicable DVT prophylaxis: heparin infusion GI prophylaxis: None Glucose control: SSI Mobility: BR Code Status: FULL Family Communication: updated patient  Disposition: ICU  The patient is critically ill with multiple organ systems failure and requires high complexity decision making for assessment and support, frequent evaluation and titration of therapies, application of advanced monitoring technologies and extensive interpretation of multiple databases. Critical Care Time devoted to patient care services described in this note independent of APP/resident time (if applicable)  is 31 minutes.   Erskine Emery MD [facility] 10/25/2019 8:35 AM Personal pager: [phone] If unanswered, please page CCM On-call: [phone]

## 2019-10-25 NOTE — Progress Notes (Signed)
Patient ID: Douglas [name], male   DOB: [birth year], 49 y.o.   MRN: [id number] S: remains on pressors and CRRT  - no UOP CVP still reading as low but did keep him positive over 4 liters-  Alert NAD-  NGT output slowing down-  Getting fluids at 200 per hour    O:BP 97/61   Pulse (!) 110   Temp 98.1 F (36.7 C) (Oral)   Resp (!) 22   Ht 6' (1.829 m)   Wt (!) 186 kg   SpO2 100%   BMI 55.61 kg/m   Intake/Output Summary (Last 24 hours) at 10/25/2019 0644 Last data filed at 10/25/2019 0600 Gross per 24 hour  Intake 5737.67 ml  Output 1165 ml  Net 4572.67 ml   Intake/Output: I/O last 3 completed shifts: In: 7565.8 [I.V.:7465.8; IV Piggyback:100] Out: 6438 [Emesis/NG output:6200; Other:238]  Intake/Output this shift:  Total I/O In: 2472.1 [I.V.:2372; IV Piggyback:100.1] Out: 59 [Emesis/NG output:50; Other:9] Weight change: 5.443 kg Gen: obese AAM lying in bed in NAD-  Right IJ vascath - placed 5/8 CVS: tachy at 104 Resp: diminished BS Abd: obese, distended, soft, NT Ext: trace presacral edema vs obesity  Recent Labs  Lab 10/20/19 2037 10/21/19 0443 10/22/19 0220 10/22/19 1610 10/23/19 0321 10/23/19 1550 10/24/19 0358 10/24/19 1652 10/25/19 0354  NA 137   < > 144 147* 145 144 142 142 141  K 4.5   < > 4.2 4.0 4.2 3.8 4.2 3.9 3.7  CL 100   < > 97* 94* 94* 96* 96* 104 103  CO2 22   < > 24 29 30 28 26 24 25   GLUCOSE 124*   < > 118* 113* 115* 114* 114* 112* 96  BUN 38*   < > 69* 86* 70* 53* 43* 34* 30*  CREATININE 2.52*   < > 6.25* 8.26* 6.97* 5.63* 5.03* 4.13* 3.66*  ALBUMIN 3.3*  --  2.9*  --  3.0* 2.9* 2.9* 2.6* 2.4*  CALCIUM 9.8   < > 9.5 9.3 9.3 9.1 9.4 9.1 9.0  PHOS  --   --  6.7*  --  6.0* 4.8* 4.1 3.5 3.1  AST 26  --   --   --  25  --   --   --  26  ALT 39  --   --   --  37  --   --   --  31   < > = values in this interval not displayed.   Liver Function Tests: Recent Labs  Lab 10/20/19 2037 10/22/19 0220 10/23/19 0321 10/23/19 1550 10/24/19 0358  10/24/19 1652 10/25/19 0354  AST 26  --  25  --   --   --  26  ALT 39  --  37  --   --   --  31  ALKPHOS 67  --  66  --   --   --  59  BILITOT 1.7*  --  1.2  --   --   --  1.2  PROT 7.9  --  8.6*  --   --   --  7.3  ALBUMIN 3.3*   < > 3.0*   < > 2.9* 2.6* 2.4*   < > = values in this interval not displayed.   No results for input(s): LIPASE, AMYLASE in the last 168 hours. No results for input(s): AMMONIA in the last 168 hours. CBC: Recent Labs  Lab 10/20/19 2037 10/20/19 2037 10/21/19 0443 10/21/19 0443 10/22/19 0220 10/22/19  6834 10/23/19 0321 10/24/19 0358 10/25/19 0354  WBC 9.7   < > 10.3   < > 6.4   < > 10.2 10.4 14.2*  NEUTROABS 7.5  --   --   --   --   --  6.6  --  8.6*  HGB 13.7   < > 13.1   < > 12.7*   < > 13.2 13.5 11.5*  HCT 44.0   < > 41.6   < > 39.9   < > 41.5 43.5 39.0  MCV 88.7   < > 87.6  --  86.7  --  88.9 91.8 94.2  PLT 695*   < > 671*   < > 617*   < > 648* 588* 463*   < > = values in this interval not displayed.   Cardiac Enzymes: Recent Labs  Lab 10/21/19 1113  CKTOTAL 344   CBG: Recent Labs  Lab 10/24/19 1145 10/24/19 1502 10/24/19 2102 10/25/19 0001 10/25/19 0502  GLUCAP 95 85 88 94 99    Iron Studies: No results for input(s): IRON, TIBC, TRANSFERRIN, FERRITIN in the last 72 hours. Studies/Results: DG Abd Portable 1V  Result Date: 10/24/2019 CLINICAL DATA:  Nasogastric tube present. EXAM: PORTABLE ABDOMEN - 1 VIEW COMPARISON:  Oct 21, 2019. FINDINGS: Continued small bowel dilatation is noted. Distal tip of nasogastric tube is seen in proximal stomach. No radio-opaque calculi or other significant radiographic abnormality are seen. IMPRESSION: Distal tip of nasogastric tube seen in proximal stomach. Continued small bowel dilatation is noted concerning for distal small bowel obstruction or ileus. Electronically Signed   By: Marijo Conception M.D.   On: 10/24/2019 08:09   . chlorhexidine  15 mL Mouth Rinse BID  . Chlorhexidine Gluconate Cloth  6  each Topical Q0600  . insulin aspart  0-9 Units Subcutaneous Q6H  . mouth rinse  15 mL Mouth Rinse q12n4p  . mometasone-formoterol  2 puff Inhalation BID  . pantoprazole (PROTONIX) IV  40 mg Intravenous Q24H  . polyethylene glycol  17 g Oral BID  . senna-docusate  1 tablet Oral BID  . sertraline  50 mg Oral Daily  . sodium chloride flush  10-40 mL Intracatheter Q12H    BMET    Component Value Date/Time   NA 141 10/25/2019 0354   K 3.7 10/25/2019 0354   CL 103 10/25/2019 0354   CO2 25 10/25/2019 0354   GLUCOSE 96 10/25/2019 0354   BUN 30 (H) 10/25/2019 0354   CREATININE 3.66 (H) 10/25/2019 0354   CALCIUM 9.0 10/25/2019 0354   GFRNONAA 18 (L) 10/25/2019 0354   GFRAA 21 (L) 10/25/2019 0354   CBC    Component Value Date/Time   WBC 14.2 (H) 10/25/2019 0354   RBC 4.14 (L) 10/25/2019 0354   HGB 11.5 (L) 10/25/2019 0354   HCT 39.0 10/25/2019 0354   PLT 463 (H) 10/25/2019 0354   MCV 94.2 10/25/2019 0354   MCH 27.8 10/25/2019 0354   MCHC 29.5 (L) 10/25/2019 0354   RDW 14.9 10/25/2019 0354   LYMPHSABS 2.1 10/25/2019 0354   MONOABS 2.6 (H) 10/25/2019 0354   EOSABS 0.5 10/25/2019 0354   BASOSABS 0.1 10/25/2019 0354    Assessment/Plan: 1. AKI- presumably due to ischemic ATN in setting of hypotension, volume depletion, acute illness, as well as NSAID use and concomitant ARB therapy. Also concerning for bladder outlet obstruction -butCT scan did not show evidence of. His serum creatinine was normal a week ago. 1. Continue to hold ARB  and lasix  2. Started CRRT on 10/22/19 due to ongoing hypotension and progressive AKI tolerating it well 1. Pre-filter 4K/2.5Ca at 500 ml/hr, post-filter 4K/2.5Ca at 300 ml/hr, Dialysate 4K/2.5Ca at 1800, UF goal to not remove and hydrate, no heparin-   Continue CRRT for today  2. Acute hypoxic respiratory distress- possible aspiration.   Markedly improved , do not think volume overloaded 3. Bilateral knee injuries with tendon damage as well as  menisci. Ortho evaluating but injury occurred 4 weeks ago and his super morbid obesity, AKI, and ileus vs. SBO will delay intervention. 4. N/V/abdominal distension- abd series with dilated loops of bowel, possible ileus vs. SBO. Surgery consulted and CT scan supports severe ileus.   1. S/p NGT with large amounts of output (over 10 liters in 24 hours) 2. Starting to pass flatus and NGT output less  5. Atrial fibrillation 6. HTN- hold BP meds now that he is hypotensive. Give fluid back -  With low CVP/hypotension and tachy-  Plan for 2-3 liters positive today - aim for CVP 10-  Will dec IVF to 10 per hour  7. DM- per primary 8. Elytes- will give some K- phos is OK  9. Anemia-  hgb dec likely due to hydration- no action needed     Jennette Kettle [employer] [phone]

## 2019-10-26 LAB — CBC
HCT: 34 % — ABNORMAL LOW (ref 39.0–52.0)
Hemoglobin: 10.2 g/dL — ABNORMAL LOW (ref 13.0–17.0)
MCH: 28.3 pg (ref 26.0–34.0)
MCHC: 30 g/dL (ref 30.0–36.0)
MCV: 94.4 fL (ref 80.0–100.0)
Platelets: 317 10*3/uL (ref 150–400)
RBC: 3.6 MIL/uL — ABNORMAL LOW (ref 4.22–5.81)
RDW: 14.8 % (ref 11.5–15.5)
WBC: 16.4 10*3/uL — ABNORMAL HIGH (ref 4.0–10.5)
nRBC: 0 % (ref 0.0–0.2)

## 2019-10-26 LAB — RENAL FUNCTION PANEL
Albumin: 1.9 g/dL — ABNORMAL LOW (ref 3.5–5.0)
Anion gap: 11 (ref 5–15)
BUN: 24 mg/dL — ABNORMAL HIGH (ref 6–20)
CO2: 24 mmol/L (ref 22–32)
Calcium: 8.7 mg/dL — ABNORMAL LOW (ref 8.9–10.3)
Chloride: 105 mmol/L (ref 98–111)
Creatinine, Ser: 2.62 mg/dL — ABNORMAL HIGH (ref 0.61–1.24)
GFR calc Af Amer: 32 mL/min — ABNORMAL LOW (ref >60)
GFR calc non Af Amer: 27 mL/min — ABNORMAL LOW (ref >60)
Glucose, Bld: 88 mg/dL (ref 70–99)
Phosphorus: 2.7 mg/dL (ref 2.5–4.6)
Potassium: 3.6 mmol/L (ref 3.5–5.1)
Sodium: 140 mmol/L (ref 135–145)

## 2019-10-26 LAB — GLUCOSE, CAPILLARY
Glucose-Capillary: 73 mg/dL (ref 70–99)
Glucose-Capillary: 77 mg/dL (ref 70–99)
Glucose-Capillary: 81 mg/dL (ref 70–99)

## 2019-10-26 LAB — CULTURE, BLOOD (ROUTINE X 2)
Culture: NO GROWTH
Culture: NO GROWTH
Special Requests: ADEQUATE
Special Requests: ADEQUATE

## 2019-10-26 LAB — APTT
aPTT: 101 seconds — ABNORMAL HIGH (ref 24–36)
aPTT: 91 seconds — ABNORMAL HIGH (ref 24–36)

## 2019-10-26 LAB — HEPARIN LEVEL (UNFRACTIONATED)
Heparin Unfractionated: 0.47 IU/mL (ref 0.30–0.70)
Heparin Unfractionated: 0.32 IU/mL (ref 0.30–0.70)

## 2019-10-26 LAB — MAGNESIUM: Magnesium: 2.4 mg/dL (ref 1.7–2.4)

## 2019-10-26 MED ORDER — METOCLOPRAMIDE HCL 5 MG/ML IJ SOLN
5.0000 mg | Freq: Four times a day (QID) | INTRAMUSCULAR | Status: AC
  Administered 2019-10-26 – 2019-10-27 (×6): 5 mg via INTRAVENOUS
  Filled 2019-10-26 (×6): qty 2

## 2019-10-26 MED ORDER — HYDRALAZINE HCL 50 MG PO TABS: 50.0000 mg | ORAL_TABLET | Freq: Three times a day (TID) | ORAL | Status: DC

## 2019-10-26 MED ORDER — BISACODYL 10 MG RE SUPP
10.0000 mg | Freq: Once | RECTAL | Status: AC
  Administered 2019-10-26: 10 mg via RECTAL
  Filled 2019-10-26: qty 1

## 2019-10-26 MED ORDER — HYDRALAZINE HCL 50 MG PO TABS
50.0000 mg | ORAL_TABLET | Freq: Three times a day (TID) | ORAL | Status: DC
  Administered 2019-10-26: 50 mg
  Filled 2019-10-26: qty 1

## 2019-10-26 NOTE — Progress Notes (Signed)
NAME:  Douglas [name], MRN:  [id number], DOB:  Oct 13, 1970, LOS: 6 ADMISSION DATE:  10/20/2019, CONSULTATION DATE: 10/22/2019 REFERRING MD: Dr. Blake [name], Dr. Abel [name], CHIEF COMPLAINT:  Renal failure, SOB   Brief History   49 year old male, acute renal failure, oliguria, I see admission for CVVHD and hypotension.  History of present illness   This is a 49 year old gentleman history of diabetes hypertension came to the hospital after mechanical fall which occurred several weeks ago.  Patient was found to have acute renal failure.  And concern for SBO versus ileus.  NG tube was placed.  A rapid response occurred the night of 10/21/2019.  He had a significant amount of NG output.  He was also given 1 L fluid bolus.  Patient was seen by surgery for ileus.  Recommending continued NG tube decompression.  Echocardiogram was completed today.  Renal ultrasound pending.  Patient had worsening kidney function, initial serum creatinine of 2.5 up to 3.24.  Now serum creatinine 6.25 with a serum bicarb of 24.  Nephrology was consulted for evaluation.  Due to low blood pressures and very little urine output PCCM was consulted for recommendations transferred to the intensive care unit for close observation and likely initiation of CVVHD.  Patient does have history of paroxysmal atrial fibrillation.  Was on Eliquis PTA.  Switch to heparin infusion.  Past Medical History   Past Medical History:  Diagnosis Date  . CHF (congestive heart failure) (HCC)   . Degenerative arthritis   . Diabetes mellitus without complication (HCC)   . Gout      Significant Hospital Events   None   Consults:  Pulmonary critical care Nephrology Pharmacy Surgery  Procedures:  none  Significant Diagnostic Tests:  Previous echocardiogram EF 45%  10/22/2019 echo: Pending Renal ultrasound 10/22/2019: Pending  Micro Data:  10/20/2019: Covid negative influenza PCR negative  Antimicrobials:  Ceftriaxone 5/8>> ?7 days  planned).  Interim history/subjective:  Bps improved. Had BM yesterday. NGT still high.  Objective   Blood pressure (!) 149/84, pulse 100, temperature 99.1 F (37.3 C), temperature source Oral, resp. rate 20, height 6' (1.829 m), weight (!) 186 kg, SpO2 96 %. CVP:  [5 mmHg-8 mmHg] 7 mmHg      Intake/Output Summary (Last 24 hours) at 10/26/2019 0742 Last data filed at 10/26/2019 0700 Gross per 24 hour  Intake 4747.31 ml  Output 2104 ml  Net 2643.31 ml   Filed Weights   10/24/19 0000 10/24/19 0406 10/25/19 0440  Weight: (!) 180.5 kg (!) 181 kg (!) 186 kg    Examination: GEN: Overweight man in no acute distress HEENT: Mallampati 4, trachea midline CV: Heart sounds are regular, extremities are warm PULM: Diminished secondary to body habitus, no wheezing GI: Protuberant but softer, hypoactive bowel sounds, NG tube in place with bilious output EXT: no edema  NEURO: He moves all 4 extremities to command PSYCH: RASS 0, good insight SKIN: No rashes, right IJ HD catheter clean dry and intact  Labs look good Pct 2.5 CBGs okay  Resolved Hospital Problem list     Assessment & Plan:   Acute renal failure Multifactorial related to dehydration poor renal perfusion, diuretics, ARB and previous NSAID use. - Probably can do trial off CRRT today, nephrology following  Morbid obesity, sleep apnea, likely obesity hypoventilation syndrome. - We will try to avoid BiPAP in the setting of significant ileus  Shock- resolved  Ileus- related to renal failure; starting to improve - NG to suction:  past 24h  output has been pretty high still - Trial of reglan ongoing - General surgery following - Hopefully can try trickle feeds soon now that moving bowels  Shock- improved with rehydration and abx - Ceftriaxone x 7 days  Paroxysmal atrial fibrillation- looks like he's back in sinus - Continue IV heparin  History of chronic systolic heart failure - Repeat echo terrible windows not  unexpected, EF looked grossly preserved  Type 2 diabetes, hyperglycemia - Resolved with lack of PO - Continue to monitor given lack of symptoms  Bad knees- needs repair at some point, ortho following peripherally  Best practice:  Diet: on hold Pain/Anxiety/Delirium protocol (if indicated): Not applicable VAP protocol (if indicated): Not applicable DVT prophylaxis: heparin infusion GI prophylaxis: None Glucose control: SSI Mobility: BR Code Status: FULL Family Communication: updated patient  Disposition: ICU pending assurance he does not need CRRT again  The patient is critically ill with multiple organ systems failure and requires high complexity decision making for assessment and support, frequent evaluation and titration of therapies, application of advanced monitoring technologies and extensive interpretation of multiple databases. Critical Care Time devoted to patient care services described in this note independent of APP/resident time (if applicable)  is 31 minutes.   Douglas Emery MD [facility] 10/26/2019 7:42 AM Personal pager: [phone] If unanswered, please page CCM On-call: [phone]

## 2019-10-26 NOTE — Progress Notes (Signed)
eLink Physician-Brief Progress Note Patient Name: Douglas [name] DOB: Apr 27, 1971 MRN: [id number]   Date of Service  10/26/2019  HPI/Events of Note  Hypertension - BP = 171/95. Will restart home hydralazine per tube.  eICU Interventions  Will order: 1. Hydralazine 50 mg PO Q 8 hours. Hold dose for SBP < 110.      Intervention Category Major Interventions: Hypertension - evaluation and management  Leandre Wien [place] 10/26/2019, 9:07 PM

## 2019-10-26 NOTE — Progress Notes (Signed)
Patient ID: Douglas [name], male   DOB: April 24, 1971, 49 y.o.   MRN: [id number]       Subjective: Had multiple loose BMs yesterday.  NGT output still 1700 yesterday, but patient states he is eating a lot of ice chips. No abdominal pain.  Starting to make urine.  ROS: See above, otherwise other systems negative  Objective: Vital signs in last 24 hours: Temp:  [97.9 F (36.6 C)-99.3 F (37.4 C)] 99.1 F (37.3 C) (05/12 0733) Pulse Rate:  [51-111] 100 (05/12 0700) Resp:  [18-25] 20 (05/12 0700) BP: (87-158)/(60-103) 149/84 (05/12 0700) SpO2:  [94 %-100 %] 96 % (05/12 0700) Last BM Date: 10/26/19  Intake/Output from previous day: 05/11 0701 - 05/12 0700 In: 4747.3 [P.O.:300; I.V.:4144.8; IV Piggyback:302.6] Out: 2104 [Emesis/NG output:1800; Stool:301] Intake/Output this shift: No intake/output data recorded.  PE: Abd: soft, obese, some BS, NGT with some watered-down appearing bilious output, NT  Lab Results:  Recent Labs    10/25/19 0354 10/26/19 0415  WBC 14.2* 16.4*  HGB 11.5* 10.2*  HCT 39.0 34.0*  PLT 463* 317   BMET Recent Labs    10/25/19 1515 10/26/19 0415  NA 140 140  K 3.8 3.6  CL 105 105  CO2 25 24  GLUCOSE 94 88  BUN 27* 24*  CREATININE 3.19* 2.62*  CALCIUM 8.8* 8.7*   PT/INR No results for input(s): LABPROT, INR in the last 72 hours. CMP     Component Value Date/Time   NA 140 10/26/2019 0415   K 3.6 10/26/2019 0415   CL 105 10/26/2019 0415   CO2 24 10/26/2019 0415   GLUCOSE 88 10/26/2019 0415   BUN 24 (H) 10/26/2019 0415   CREATININE 2.62 (H) 10/26/2019 0415   CALCIUM 8.7 (L) 10/26/2019 0415   PROT 7.3 10/25/2019 0354   ALBUMIN 1.9 (L) 10/26/2019 0415   AST 26 10/25/2019 0354   ALT 31 10/25/2019 0354   ALKPHOS 59 10/25/2019 0354   BILITOT 1.2 10/25/2019 0354   GFRNONAA 27 (L) 10/26/2019 0415   GFRAA 32 (L) 10/26/2019 0415   Lipase  No results found for: LIPASE     Studies/Results: No results  found.  Anti-infectives: Anti-infectives (From admission, onward)   Start     Dose/Rate Route Frequency Ordered Stop   10/23/19 0000  cefTRIAXone (ROCEPHIN) 1 g in sodium chloride 0.9 % 100 mL IVPB     1 g 200 mL/hr over 30 Minutes Intravenous Every 24 hours 10/22/19 1357 10/30/19 2159       Assessment/Plan ARF- on CRRT, stopping today, starting to make some urine Knee injury DM CHF HTN A fib  Ileus -clamp NGT today and see how he does.  If well can DC later today vs tomorrow. -mobilize as able once off CRRT and as knees allow. -K 3.6 today, will ideally like this over 4.  Since CRRT is stopping this will hopefully increase some to help continue with better bowel function.   FEN -NPO/NGT clamp today VTE -heparin for a fib ID -Rocephin UTI   LOS: 6 days    Letha Cape , [facility] Surgery 10/26/2019, 7:52 AM Please see Amion for pager number during day hours 7:00am-4:30pm or 7:00am -11:30am on weekends

## 2019-10-26 NOTE — Progress Notes (Signed)
Patient ID: Douglas [name], male   DOB: 1970/09/08, 49 y.o.   MRN: [id number] S: off pressors - on CRRT  - no UOP recorded-  He says he has started to make a little !!-  CVP coming up positive another 2  liters-  Alert NAD-  NGT output slill pretty significant-  Getting fluids at 150 per hour    O:BP (!) 149/84 (BP Location: Left Wrist)   Pulse 100   Temp 98.9 F (37.2 C) (Axillary)   Resp 20   Ht 6' (1.829 m)   Wt (!) 186 kg   SpO2 96%   BMI 55.61 kg/m   Intake/Output Summary (Last 24 hours) at 10/26/2019 0716 Last data filed at 10/26/2019 0700 Gross per 24 hour  Intake 4747.31 ml  Output 2104 ml  Net 2643.31 ml   Intake/Output: I/O last 3 completed shifts: In: 7443.7 [P.O.:300; I.V.:6741.1; IV Piggyback:402.6] Out: 2413 [Emesis/NG output:2100; Other:12; Stool:301]  Intake/Output this shift:  No intake/output data recorded. Weight change:  Gen: obese AAM lying in bed in NAD-  Right IJ vascath - placed 5/8 CVS: tachy at 104 Resp: diminished BS Abd: obese, distended, soft, NT Ext: trace presacral edema vs obesity  Recent Labs  Lab 10/20/19 2037 10/21/19 0443 10/23/19 0321 10/23/19 1550 10/24/19 0358 10/24/19 1652 10/25/19 0354 10/25/19 1515 10/26/19 0415  NA 137   < > 145 144 142 142 141 140 140  K 4.5   < > 4.2 3.8 4.2 3.9 3.7 3.8 3.6  CL 100   < > 94* 96* 96* 104 103 105 105  CO2 22   < > 30 28 26 24 25 25 24   GLUCOSE 124*   < > 115* 114* 114* 112* 96 94 88  BUN 38*   < > 70* 53* 43* 34* 30* 27* 24*  CREATININE 2.52*   < > 6.97* 5.63* 5.03* 4.13* 3.66* 3.19* 2.62*  ALBUMIN 3.3*   < > 3.0* 2.9* 2.9* 2.6* 2.4* 2.2* 1.9*  CALCIUM 9.8   < > 9.3 9.1 9.4 9.1 9.0 8.8* 8.7*  PHOS  --    < > 6.0* 4.8* 4.1 3.5 3.1 2.5 2.7  AST 26  --  25  --   --   --  26  --   --   ALT 39  --  37  --   --   --  31  --   --    < > = values in this interval not displayed.   Liver Function Tests: Recent Labs  Lab 10/20/19 2037 10/22/19 0220 10/23/19 0321 10/23/19 1550  10/25/19 0354 10/25/19 1515 10/26/19 0415  AST 26  --  25  --  26  --   --   ALT 39  --  37  --  31  --   --   ALKPHOS 67  --  66  --  59  --   --   BILITOT 1.7*  --  1.2  --  1.2  --   --   PROT 7.9  --  8.6*  --  7.3  --   --   ALBUMIN 3.3*   < > 3.0*   < > 2.4* 2.2* 1.9*   < > = values in this interval not displayed.   No results for input(s): LIPASE, AMYLASE in the last 168 hours. No results for input(s): AMMONIA in the last 168 hours. CBC: Recent Labs  Lab 10/20/19 2037 10/21/19 0443 10/22/19 0220 10/22/19 0220  10/23/19 0321 10/23/19 0321 10/24/19 0358 10/25/19 0354 10/26/19 0415  WBC 9.7   < > 6.4   < > 10.2   < > 10.4 14.2* 16.4*  NEUTROABS 7.5  --   --   --  6.6  --   --  8.6*  --   HGB 13.7   < > 12.7*   < > 13.2   < > 13.5 11.5* 10.2*  HCT 44.0   < > 39.9   < > 41.5   < > 43.5 39.0 34.0*  MCV 88.7   < > 86.7  --  88.9  --  91.8 94.2 94.4  PLT 695*   < > 617*   < > 648*   < > 588* 463* 317   < > = values in this interval not displayed.   Cardiac Enzymes: Recent Labs  Lab 10/21/19 1113  CKTOTAL 344   CBG: Recent Labs  Lab 10/25/19 1110 10/25/19 1534 10/25/19 1820 10/25/19 2335 10/26/19 0601  GLUCAP 83 76 72 78 73    Iron Studies: No results for input(s): IRON, TIBC, TRANSFERRIN, FERRITIN in the last 72 hours. Studies/Results: No results found. . chlorhexidine  15 mL Mouth Rinse BID  . Chlorhexidine Gluconate Cloth  6 each Topical Q0600  . insulin aspart  0-9 Units Subcutaneous Q6H  . mouth rinse  15 mL Mouth Rinse q12n4p  . mometasone-formoterol  2 puff Inhalation BID  . pantoprazole (PROTONIX) IV  40 mg Intravenous Q24H  . polyethylene glycol  17 g Oral BID  . senna-docusate  1 tablet Oral BID  . sertraline  50 mg Oral Daily  . sodium chloride flush  10-40 mL Intracatheter Q12H    BMET    Component Value Date/Time   NA 140 10/26/2019 0415   K 3.6 10/26/2019 0415   CL 105 10/26/2019 0415   CO2 24 10/26/2019 0415   GLUCOSE 88 10/26/2019  0415   BUN 24 (H) 10/26/2019 0415   CREATININE 2.62 (H) 10/26/2019 0415   CALCIUM 8.7 (L) 10/26/2019 0415   GFRNONAA 27 (L) 10/26/2019 0415   GFRAA 32 (L) 10/26/2019 0415   CBC    Component Value Date/Time   WBC 16.4 (H) 10/26/2019 0415   RBC 3.60 (L) 10/26/2019 0415   HGB 10.2 (L) 10/26/2019 0415   HCT 34.0 (L) 10/26/2019 0415   PLT 317 10/26/2019 0415   MCV 94.4 10/26/2019 0415   MCH 28.3 10/26/2019 0415   MCHC 30.0 10/26/2019 0415   RDW 14.8 10/26/2019 0415   LYMPHSABS 2.1 10/25/2019 0354   MONOABS 2.6 (H) 10/25/2019 0354   EOSABS 0.5 10/25/2019 0354   BASOSABS 0.1 10/25/2019 0354    Assessment/Plan: 1. AKI- presumably due to ischemic ATN in setting of hypotension, volume depletion, acute illness, as well as NSAID use and concomitant ARB therapy.  His serum creatinine was normal a week ago. 1. Continue to hold ARB and lasix  2. Started CRRT on 10/22/19 due to ongoing hypotension and progressive AKI tolerating it well 1. Pre-filter 4K/2.5Ca at 500 ml/hr, post-filter 4K/2.5Ca at 300 ml/hr, Dialysate 4K/2.5Ca at 1800, UF goal to not remove and hydrate, no heparin-  Since BP is better, starting to make urine and labs are good will stop CRRT  2. Acute hypoxic respiratory distress- possible aspiration.   Markedly improved , not volume overloaded 3. Bilateral knee injuries with tendon damage as well as menisci. Ortho evaluating but injury occurred 4 weeks ago and his super morbid obesity, AKI,  and ileus vs. SBO will delay intervention. 4. N/V/abdominal distension- abd series with dilated loops of bowel, possible ileus vs. SBO. Surgery consulted and CT scan supports severe ileus.   1. S/p NGT with large amounts of output (over 10 liters in 24 hours) 2. Starting to pass flatus but NGT output still close to 2 liters over 24 hours  5. Atrial fibrillation 6. HTN- hold BP meds now that he is hypotensive. Give fluid back -  With low CVP/hypotension and tachy-  I want to cont 100 per hour  for now  7. DM- per primary 8. Elytes- will not replete K or phos as am stopping CRRT - suspect may drift up  9. Anemia-  hgb dec likely due to hydration- no action needed yet     [place] [phone]

## 2019-10-26 NOTE — Progress Notes (Signed)
PHARMACY - TOTAL PARENTERAL NUTRITION CONSULT NOTE   Indication: Prolonged ileus  Patient Measurements: Height: 6' (182.9 cm) Weight: (scale on bed is not zeroed) IBW/kg (Calculated) : 77.6 TPN AdjBW (KG): 104.7 Body mass index is 55.61 kg/m.   Assessment: This is a 49 year old gentleman history of diabetes hypertension came to the hospital after mechanical fall which occurred several weeks ago.  Patient was found to have acute renal failure.  And concern for SBO versus ileus.  NG tube was placed for decompression with ongoing high output. Patient on CRRT. Pharmacy consulted for TPN.  Nutritional Goals (per RD recommendation on 5/7): Kcal:  2200-2400; Protein:  125-160 grams; Fluid:  > 2.2 L - will minimize fluids given CRRT and AKI Goal TPN rate is 80 mL/hr (provides 130 g of protein and 2200 kcals per day)  Current Nutrition:  NPO  Plan:  Per discussion with Trauma PA Barnetta Chapel, d/c TPN consult. TO clamp NGT today and see how patient progresses. Hopeful to try trickle feeds soon now that having BMs   Leia Alf, PharmD, BCPS Please check AMION for all [facility] Pharmacy contact numbers Clinical Pharmacist 10/26/2019 8:44 AM

## 2019-10-26 NOTE — Progress Notes (Addendum)
ANTICOAGULATION CONSULT NOTE  Pharmacy Consult for heparin Indication: Afib  Allergies  Allergen Reactions  . Benazepril Anaphylaxis  . Betadine [Povidone Iodine]     rash  . Prednisone Other (See Comments)    Headache.    Patient Measurements: Height: 6' (182.9 cm) Weight: (scale on bed is not zeroed) IBW/kg (Calculated) : 77.6 Heparin Dosing Weight: 123kg  Vital Signs: Temp: 98.9 F (37.2 C) (05/12 0500) Temp Source: Axillary (05/12 0500) BP: 149/84 (05/12 0700) Pulse Rate: 100 (05/12 0700)  Labs: Recent Labs    10/24/19 0358 10/24/19 0358 10/24/19 1652 10/25/19 0354 10/25/19 1515 10/26/19 0415  HGB 13.5   < >  --  11.5*  --  10.2*  HCT 43.5  --   --  39.0  --  34.0*  PLT 588*  --   --  463*  --  317  APTT 103*   < > 109* 68*  --  101*  HEPARINUNFRC  --   --   --  0.56  --  0.47  CREATININE 5.03*   < > 4.13* 3.66* 3.19* 2.62*   < > = values in this interval not displayed.    Estimated Creatinine Clearance: 57.7 mL/min (A) (by C-G formula based on SCr of 2.62 mg/dL (H)).  Assessment: 49 yo male with h/o AFib, Eliquis on hold, on heparin.    aPTT at upper end of goal this morning at 101, heparin level in therapeutic range but still not corelating with aPTT. Hgb 10.2 down from 11.5 yesterday, platelet count WNL. No s/sx of bleeding or infusion issues. Will decrease heparin rate this morning slightly and recheck aPTT/heparin level.    Goal of Therapy:  Heparin level 0.3-0.7 units/ml aPTT 66-102 seconds Monitor platelets by anticoagulation protocol: Yes   Plan:  Decrease heparin infusion to 2150 units/hr Check aPTT/heparin level in ~6 hours Monitor daily HL, CBC, and for s/sx of bleeding   ADDENDUM: APTT is at goal this afternoon, no bleeding or infusion issues. Heparin level not yet correlating with aPTT, therefore will recheck heparin level and aPTT tomorrow morning with labs. Continue heparin infusion at 2150 units/hr.   Ellison Carwin, PharmD PGY1  Pharmacy Resident  Please check AMION for all [facility] Pharmacy phone numbers After 10:00 PM, call Main Pharmacy [phone]

## 2019-10-27 DIAGNOSIS — I4811 Longstanding persistent atrial fibrillation: Secondary | ICD-10-CM

## 2019-10-27 DIAGNOSIS — I5022 Chronic systolic (congestive) heart failure: Secondary | ICD-10-CM

## 2019-10-27 LAB — RENAL FUNCTION PANEL
Albumin: 2 g/dL — ABNORMAL LOW (ref 3.5–5.0)
Anion gap: 12 (ref 5–15)
BUN: 32 mg/dL — ABNORMAL HIGH (ref 6–20)
CO2: 22 mmol/L (ref 22–32)
Calcium: 9.2 mg/dL (ref 8.9–10.3)
Chloride: 107 mmol/L (ref 98–111)
Creatinine, Ser: 1.68 mg/dL — ABNORMAL HIGH (ref 0.61–1.24)
GFR calc Af Amer: 54 mL/min — ABNORMAL LOW (ref >60)
GFR calc non Af Amer: 47 mL/min — ABNORMAL LOW (ref >60)
Glucose, Bld: 84 mg/dL (ref 70–99)
Phosphorus: 3.4 mg/dL (ref 2.5–4.6)
Potassium: 3.3 mmol/L — ABNORMAL LOW (ref 3.5–5.1)
Sodium: 141 mmol/L (ref 135–145)

## 2019-10-27 LAB — CBC
HCT: 33.6 % — ABNORMAL LOW (ref 39.0–52.0)
Hemoglobin: 10.1 g/dL — ABNORMAL LOW (ref 13.0–17.0)
MCH: 27.7 pg (ref 26.0–34.0)
MCHC: 30.1 g/dL (ref 30.0–36.0)
MCV: 92.1 fL (ref 80.0–100.0)
Platelets: 311 10*3/uL (ref 150–400)
RBC: 3.65 MIL/uL — ABNORMAL LOW (ref 4.22–5.81)
RDW: 14.7 % (ref 11.5–15.5)
WBC: 15.9 10*3/uL — ABNORMAL HIGH (ref 4.0–10.5)
nRBC: 0 % (ref 0.0–0.2)

## 2019-10-27 LAB — GLUCOSE, CAPILLARY
Glucose-Capillary: 83 mg/dL (ref 70–99)
Glucose-Capillary: 109 mg/dL — ABNORMAL HIGH (ref 70–99)
Glucose-Capillary: 93 mg/dL (ref 70–99)
Glucose-Capillary: 83 mg/dL (ref 70–99)

## 2019-10-27 LAB — HEPARIN LEVEL (UNFRACTIONATED): Heparin Unfractionated: 0.27 IU/mL — ABNORMAL LOW (ref 0.30–0.70)

## 2019-10-27 LAB — APTT: aPTT: 97 seconds — ABNORMAL HIGH (ref 24–36)

## 2019-10-27 LAB — MAGNESIUM: Magnesium: 1.9 mg/dL (ref 1.7–2.4)

## 2019-10-27 MED ORDER — BOOST / RESOURCE BREEZE PO LIQD CUSTOM
1.0000 | Freq: Three times a day (TID) | ORAL | Status: DC
  Administered 2019-10-27 – 2019-10-28 (×2): 1 via ORAL

## 2019-10-27 MED ORDER — DILTIAZEM HCL 30 MG PO TABS
60.0000 mg | ORAL_TABLET | Freq: Four times a day (QID) | ORAL | Status: DC
  Administered 2019-10-27 – 2019-11-04 (×31): 60 mg via ORAL
  Filled 2019-10-27 (×5): qty 2
  Filled 2019-10-27: qty 1
  Filled 2019-10-27 (×3): qty 2
  Filled 2019-10-27: qty 1
  Filled 2019-10-27 (×15): qty 2
  Filled 2019-10-27: qty 1
  Filled 2019-10-27 (×5): qty 2

## 2019-10-27 MED ORDER — HYDRALAZINE HCL 50 MG PO TABS
50.0000 mg | ORAL_TABLET | Freq: Four times a day (QID) | ORAL | Status: DC
  Administered 2019-10-27: 50 mg
  Filled 2019-10-27: qty 1

## 2019-10-27 MED ORDER — METOPROLOL SUCCINATE ER 50 MG PO TB24
100.0000 mg | ORAL_TABLET | Freq: Every day | ORAL | Status: DC
  Administered 2019-10-27 – 2019-11-09 (×14): 100 mg via ORAL
  Filled 2019-10-27 (×14): qty 2

## 2019-10-27 MED ORDER — ADULT MULTIVITAMIN W/MINERALS CH
1.0000 | ORAL_TABLET | Freq: Every day | ORAL | Status: DC
  Administered 2019-10-27 – 2019-11-09 (×13): 1 via ORAL
  Filled 2019-10-27 (×14): qty 1

## 2019-10-27 MED ORDER — PANTOPRAZOLE SODIUM 40 MG PO TBEC
40.0000 mg | DELAYED_RELEASE_TABLET | Freq: Every day | ORAL | Status: DC
  Administered 2019-10-27 – 2019-11-09 (×14): 40 mg via ORAL
  Filled 2019-10-27 (×14): qty 1

## 2019-10-27 MED ORDER — POTASSIUM CHLORIDE 10 MEQ/50ML IV SOLN
10.0000 meq | INTRAVENOUS | Status: AC
  Administered 2019-10-27 (×6): 10 meq via INTRAVENOUS
  Filled 2019-10-27 (×8): qty 50

## 2019-10-27 MED ORDER — POLYETHYLENE GLYCOL 3350 17 G PO PACK
17.0000 g | PACK | Freq: Every day | ORAL | Status: DC
  Administered 2019-10-28: 17 g via ORAL
  Filled 2019-10-27: qty 1

## 2019-10-27 MED ORDER — HYDRALAZINE HCL 50 MG PO TABS
75.0000 mg | ORAL_TABLET | Freq: Four times a day (QID) | ORAL | Status: DC
  Administered 2019-10-27 (×2): 75 mg
  Filled 2019-10-27 (×3): qty 1

## 2019-10-27 MED ORDER — HYDRALAZINE HCL 50 MG PO TABS
75.0000 mg | ORAL_TABLET | Freq: Four times a day (QID) | ORAL | Status: DC
  Administered 2019-10-27 – 2019-11-09 (×48): 75 mg via ORAL
  Filled 2019-10-27 (×45): qty 1

## 2019-10-27 NOTE — Progress Notes (Signed)
NAME:  Douglas [name], MRN:  [id number], DOB:  26-Nov-1970, LOS: 7 ADMISSION DATE:  10/20/2019, CONSULTATION DATE: 10/22/2019 REFERRING MD: Dr. Blake Divine, Dr. Abel Presto, CHIEF COMPLAINT:  Renal failure, SOB   Brief History   49 year old male, acute renal failure, oliguria, I see admission for CVVHD and hypotension.  History of present illness   This is a 49 year old gentleman history of diabetes hypertension came to the hospital after mechanical fall which occurred several weeks ago.  Patient was found to have acute renal failure.  And concern for SBO versus ileus.  NG tube was placed.  A rapid response occurred the night of 10/21/2019.  He had a significant amount of NG output.  He was also given 1 L fluid bolus.  Patient was seen by surgery for ileus.  Recommending continued NG tube decompression.  Echocardiogram was completed today.  Renal ultrasound pending.  Patient had worsening kidney function, initial serum creatinine of 2.5 up to 3.24.  Now serum creatinine 6.25 with a serum bicarb of 24.  Nephrology was consulted for evaluation.  Due to low blood pressures and very little urine output PCCM was consulted for recommendations transferred to the intensive care unit for close observation and likely initiation of CVVHD.  Patient does have history of paroxysmal atrial fibrillation.  Was on Eliquis PTA.  Switch to heparin infusion.  Past Medical History   Past Medical History:  Diagnosis Date  . CHF (congestive heart failure) (HCC)   . Degenerative arthritis   . Diabetes mellitus without complication (HCC)   . Gout      Significant Hospital Events   None   Consults:  Pulmonary critical care Nephrology Pharmacy Surgery  Procedures:  none  Significant Diagnostic Tests:  Previous echocardiogram EF 45%  10/22/2019 echo: Pending Renal ultrasound 10/22/2019: Pending  Micro Data:  10/20/2019: Covid negative influenza PCR negative  Antimicrobials:  Ceftriaxone 5/8>> ?7 days  planned).  Interim history/subjective:  Now hypertensive (on multiple antihypertensives PTA). Ileus resolved, NGT out. Denies pain. To start clear liquid diet.  Objective   Blood pressure (!) 174/101, pulse 98, temperature 98.4 F (36.9 C), resp. rate (!) 21, height 6' (1.829 m), weight (!) 181.4 kg, SpO2 98 %. CVP:  [3 mmHg] 3 mmHg      Intake/Output Summary (Last 24 hours) at 10/27/2019 0925 Last data filed at 10/26/2019 2000 Gross per 24 hour  Intake 1564.92 ml  Output 2 ml  Net 1562.92 ml   Filed Weights   10/24/19 0406 10/25/19 0440 10/27/19 0330  Weight: (!) 181 kg (!) 186 kg (!) 181.4 kg    Examination: GEN: Overweight man in no acute distress HEENT: Mallampati 4, trachea midline CV: Heart sounds are regular, extremities are warm PULM: Diminished secondary to body habitus, no wheezing GI: Soft, +BS EXT: no edema  NEURO: He moves all 4 extremities to command PSYCH: RASS 0, good insight SKIN: No rashes, right IJ HD catheter clean dry and intact  Kidney function improving Making urine K/Mg a bit low  Resolved Hospital Problem list     Assessment & Plan:   Acute renal failure Multifactorial related to dehydration poor renal perfusion, diuretics, ARB and previous NSAID use. - Trial off   Shock- resolved  Ileus- related to renal failure; improved - Clear liquid diet  Shock- improved with rehydration and abx, possible UTI - Ceftriaxone x 7 days  Paroxysmal atrial fibrillation- looks like he's back in sinus - Continue IV heparin  History of chronic systolic heart failure -  Repeat echo terrible windows not unexpected, EF looked grossly preserved  Hypertension-  Working on resuming home meds: hydralazine increased, on CCB, restarted PTA BB.  Would be good candidate for entresto as OP.  Type 2 diabetes, hyperglycemia - Resolved with lack of PO - Continue to monitor given lack of symptoms  Knee injury- needs repair at some point, ortho following, have  asked them to re-engage today (spoke with Durward Fortes who will let Erlinda Hong know).  Best practice:  Diet: clear liquid Pain/Anxiety/Delirium protocol (if indicated): Not applicable VAP protocol (if indicated): Not applicable DVT prophylaxis: heparin infusion GI prophylaxis: None Glucose control: SSI Mobility: BR Code Status: FULL Family Communication: updated patient  Disposition: okay for floor, appreciate TRH taking over care  Erskine Emery MD [place] [facility] 10/27/2019 9:25 AM Personal pager: [phone] If unanswered, please page CCM On-call: [phone]

## 2019-10-27 NOTE — Progress Notes (Signed)
eLink Physician-Brief Progress Note Patient Name: Douglas [name] DOB: Dec 15, 1970 MRN: [id number]   Date of Service  10/27/2019  HPI/Events of Note  Hypertension - BP = 183/87.   eICU Interventions  Will order: 1. Increase Hydralazine frequency to Q 6 hours. 2. Add home Cardizem 60 mg PO now and Q 6 hours.      Intervention Category Major Interventions: Hypertension - evaluation and management  Hanaa Payes [place] 10/27/2019, 2:15 AM

## 2019-10-27 NOTE — Progress Notes (Addendum)
ANTICOAGULATION CONSULT NOTE  Pharmacy Consult for heparin Indication: Afib  Allergies  Allergen Reactions  . Benazepril Anaphylaxis  . Betadine [Povidone Iodine]     rash  . Prednisone Other (See Comments)    Headache.    Patient Measurements: Height: 6' (182.9 cm) Weight: (!) 181.4 kg (400 lb) IBW/kg (Calculated) : 77.6 Heparin Dosing Weight: 123kg  Vital Signs: Temp: 98.8 F (37.1 C) (05/13 0400) Temp Source: Oral (05/13 0400) BP: 174/101 (05/13 0500) Pulse Rate: 98 (05/13 0500)  Labs: Recent Labs    10/25/19 0354 10/25/19 0354 10/25/19 1515 10/26/19 0415 10/26/19 1405 10/27/19 0328 10/27/19 0625  HGB 11.5*   < >  --  10.2*  --  10.1*  --   HCT 39.0  --   --  34.0*  --  33.6*  --   PLT 463*  --   --  317  --  311  --   APTT 68*   < >  --  101* 91* 97*  --   HEPARINUNFRC 0.56   < >  --  0.47 0.32  --  0.27*  CREATININE 3.66*   < > 3.19* 2.62*  --  1.68*  --    < > = values in this interval not displayed.    Estimated Creatinine Clearance: 88.6 mL/min (A) (by C-G formula based on SCr of 1.68 mg/dL (H)).  Assessment: 49 yo male with h/o AFib, Eliquis on hold, on heparin.    aPTT at goal this morning at 97. Heparin level slightly subtherapeutic at 0.27. T. Hgb stable at 10.1. Platelet count WNL. No bleeding or infusion issues at this time. Will continue at current rate for now and monitor HL only starting tomorrow morning.   Goal of Therapy:  Heparin level 0.3-0.7 units/ml aPTT 66-102 seconds Monitor platelets by anticoagulation protocol: Yes   Plan:  Continue heparin infusion to 2150 units/hr Monitor daily HL, CBC, and for s/sx of bleeding   Ellison Carwin, PharmD PGY1 Pharmacy Resident  Please check AMION for all [facility] Pharmacy phone numbers After 10:00 PM, call Main Pharmacy [phone]

## 2019-10-27 NOTE — Progress Notes (Signed)
Pt states he has not been using CPAP, only O2 at night. 3L [place] placed beside pt for when he is ready for bed. Advised pt to notify for RT if any further assistance is needed.

## 2019-10-27 NOTE — Progress Notes (Signed)
Patient ID: Douglas [name], male   DOB: 29-Jul-1970, 49 y.o.   MRN: [id number]       Subjective: No issues with NGT clamped since yesterday morning.  Residual was none this morning.  Continues to have BMs and no nausea.  ROS: See above, otherwise other systems negative  Objective: Vital signs in last 24 hours: Temp:  [98.4 F (36.9 C)-99.8 F (37.7 C)] 98.4 F (36.9 C) (05/13 0719) Pulse Rate:  [95-110] 98 (05/13 0500) Resp:  [17-25] 21 (05/13 0500) BP: (120-183)/(71-103) 174/101 (05/13 0500) SpO2:  [91 %-100 %] 98 % (05/13 0500) Weight:  [181.4 kg] 181.4 kg (05/13 0330) Last BM Date: 10/26/19  Intake/Output from previous day: 05/12 0701 - 05/13 0700 In: 1979.2 [P.O.:360; I.V.:1619.2] Out: 302 [Urine:1; Emesis/NG output:300; Stool:1] Intake/Output this shift: No intake/output data recorded.  PE: Abd: soft, obese, but NT, +BS, NGt removed by myself at bedside  Lab Results:  Recent Labs    10/26/19 0415 10/27/19 0328  WBC 16.4* 15.9*  HGB 10.2* 10.1*  HCT 34.0* 33.6*  PLT 317 311   BMET Recent Labs    10/26/19 0415 10/27/19 0328  NA 140 141  K 3.6 3.3*  CL 105 107  CO2 24 22  GLUCOSE 88 84  BUN 24* 32*  CREATININE 2.62* 1.68*  CALCIUM 8.7* 9.2   PT/INR No results for input(s): LABPROT, INR in the last 72 hours. CMP     Component Value Date/Time   NA 141 10/27/2019 0328   K 3.3 (L) 10/27/2019 0328   CL 107 10/27/2019 0328   CO2 22 10/27/2019 0328   GLUCOSE 84 10/27/2019 0328   BUN 32 (H) 10/27/2019 0328   CREATININE 1.68 (H) 10/27/2019 0328   CALCIUM 9.2 10/27/2019 0328   PROT 7.3 10/25/2019 0354   ALBUMIN 2.0 (L) 10/27/2019 0328   AST 26 10/25/2019 0354   ALT 31 10/25/2019 0354   ALKPHOS 59 10/25/2019 0354   BILITOT 1.2 10/25/2019 0354   GFRNONAA 47 (L) 10/27/2019 0328   GFRAA 54 (L) 10/27/2019 0328   Lipase  No results found for: LIPASE     Studies/Results: No results found.  Anti-infectives: Anti-infectives (From admission,  onward)   Start     Dose/Rate Route Frequency Ordered Stop   10/23/19 0000  cefTRIAXone (ROCEPHIN) 1 g in sodium chloride 0.9 % 100 mL IVPB     1 g 200 mL/hr over 30 Minutes Intravenous Every 24 hours 10/22/19 1357 10/30/19 2159       Assessment/Plan ARF- CRRT stopped 5/12, making urine, Cr much better to 1.68 today Knee injury DM CHF HTN A fib  Ileus -Dc NGT and give clear liquids -needs K replaced to avoid slowing of bowel function   FEN -CLD VTE -heparin for a fib ID -Rocephin UTI   LOS: 7 days    Letha Cape , [facility] 10/27/2019, 8:26 AM Please see Amion for pager number during day hours 7:00am-4:30pm or 7:00am -11:30am on weekends

## 2019-10-27 NOTE — Progress Notes (Signed)
Patient ID: Douglas [name], male   DOB: 05-24-1971, 49 y.o.   MRN: [id number]   S: no pressors, actually hypertensive-  Hydralazine ordered-  Has had UOP but amount not recorded, also had BM and NGT is clamped-   crt down today ?      O:BP (!) 174/101   Pulse 98   Temp 98.8 F (37.1 C) (Oral)   Resp (!) 21   Ht 6' (1.829 m)   Wt (!) 181.4 kg   SpO2 98%   BMI 54.25 kg/m   Intake/Output Summary (Last 24 hours) at 10/27/2019 0649 Last data filed at 10/26/2019 2000 Gross per 24 hour  Intake 2211.05 ml  Output 604 ml  Net 1607.05 ml   Intake/Output: I/O last 3 completed shifts: In: 6483.4 [P.O.:660; I.V.:5520.8; IV Piggyback:302.6] Out: 2406 [Urine:1; Emesis/NG output:2100; Other:3; Stool:302]  Intake/Output this shift:  Total I/O In: 243.1 [I.V.:243.1] Out: -  Weight change:  Gen: obese AAM lying in bed in NAD-  Right IJ vascath - placed 5/8 CVS: tachy at 104 Resp: diminished BS Abd: obese, distended, soft, NT Ext: trace presacral edema vs obesity  Recent Labs  Lab 10/20/19 2037 10/21/19 0443 10/23/19 0321 10/23/19 0321 10/23/19 1550 10/24/19 0358 10/24/19 1652 10/25/19 0354 10/25/19 1515 10/26/19 0415 10/27/19 0328  NA 137   < > 145   < > 144 142 142 141 140 140 141  K 4.5   < > 4.2   < > 3.8 4.2 3.9 3.7 3.8 3.6 3.3*  CL 100   < > 94*   < > 96* 96* 104 103 105 105 107  CO2 22   < > 30   < > 28 26 24 25 25 24 22   GLUCOSE 124*   < > 115*   < > 114* 114* 112* 96 94 88 84  BUN 38*   < > 70*   < > 53* 43* 34* 30* 27* 24* 32*  CREATININE 2.52*   < > 6.97*   < > 5.63* 5.03* 4.13* 3.66* 3.19* 2.62* 1.68*  ALBUMIN 3.3*   < > 3.0*   < > 2.9* 2.9* 2.6* 2.4* 2.2* 1.9* 2.0*  CALCIUM 9.8   < > 9.3   < > 9.1 9.4 9.1 9.0 8.8* 8.7* 9.2  PHOS  --    < > 6.0*   < > 4.8* 4.1 3.5 3.1 2.5 2.7 3.4  AST 26  --  25  --   --   --   --  26  --   --   --   ALT 39  --  37  --   --   --   --  31  --   --   --    < > = values in this interval not displayed.   Liver Function  Tests: Recent Labs  Lab 10/20/19 2037 10/22/19 0220 10/23/19 0321 10/23/19 1550 10/25/19 0354 10/25/19 0354 10/25/19 1515 10/26/19 0415 10/27/19 0328  AST 26  --  25  --  26  --   --   --   --   ALT 39  --  37  --  31  --   --   --   --   ALKPHOS 67  --  66  --  59  --   --   --   --   BILITOT 1.7*  --  1.2  --  1.2  --   --   --   --  PROT 7.9  --  8.6*  --  7.3  --   --   --   --   ALBUMIN 3.3*   < > 3.0*   < > 2.4*   < > 2.2* 1.9* 2.0*   < > = values in this interval not displayed.   No results for input(s): LIPASE, AMYLASE in the last 168 hours. No results for input(s): AMMONIA in the last 168 hours. CBC: Recent Labs  Lab 10/20/19 2037 10/21/19 0443 10/23/19 0321 10/23/19 0321 10/24/19 0358 10/24/19 0358 10/25/19 0354 10/26/19 0415 10/27/19 0328  WBC 9.7   < > 10.2   < > 10.4   < > 14.2* 16.4* 15.9*  NEUTROABS 7.5  --  6.6  --   --   --  8.6*  --   --   HGB 13.7   < > 13.2   < > 13.5   < > 11.5* 10.2* 10.1*  HCT 44.0   < > 41.5   < > 43.5   < > 39.0 34.0* 33.6*  MCV 88.7   < > 88.9  --  91.8  --  94.2 94.4 92.1  PLT 695*   < > 648*   < > 588*   < > 463* 317 311   < > = values in this interval not displayed.   Cardiac Enzymes: Recent Labs  Lab 10/21/19 1113  CKTOTAL 344   CBG: Recent Labs  Lab 10/25/19 1820 10/25/19 2335 10/26/19 0601 10/26/19 1112 10/26/19 2338  GLUCAP 72 78 73 81 77    Iron Studies: No results for input(s): IRON, TIBC, TRANSFERRIN, FERRITIN in the last 72 hours. Studies/Results: No results found. . chlorhexidine  15 mL Mouth Rinse BID  . Chlorhexidine Gluconate Cloth  6 each Topical Q0600  . diltiazem  60 mg Oral Q6H  . hydrALAZINE  50 mg Per Tube Q6H  . insulin aspart  0-9 Units Subcutaneous Q6H  . mouth rinse  15 mL Mouth Rinse q12n4p  . metoCLOPramide (REGLAN) injection  5 mg Intravenous Q6H  . mometasone-formoterol  2 puff Inhalation BID  . pantoprazole (PROTONIX) IV  40 mg Intravenous Q24H  . polyethylene glycol  17 g  Oral BID  . senna-docusate  1 tablet Oral BID  . sertraline  50 mg Oral Daily  . sodium chloride flush  10-40 mL Intracatheter Q12H    BMET    Component Value Date/Time   NA 141 10/27/2019 0328   K 3.3 (L) 10/27/2019 0328   CL 107 10/27/2019 0328   CO2 22 10/27/2019 0328   GLUCOSE 84 10/27/2019 0328   BUN 32 (H) 10/27/2019 0328   CREATININE 1.68 (H) 10/27/2019 0328   CALCIUM 9.2 10/27/2019 0328   GFRNONAA 47 (L) 10/27/2019 0328   GFRAA 54 (L) 10/27/2019 0328   CBC    Component Value Date/Time   WBC 15.9 (H) 10/27/2019 0328   RBC 3.65 (L) 10/27/2019 0328   HGB 10.1 (L) 10/27/2019 0328   HCT 33.6 (L) 10/27/2019 0328   PLT 311 10/27/2019 0328   MCV 92.1 10/27/2019 0328   MCH 27.7 10/27/2019 0328   MCHC 30.1 10/27/2019 0328   RDW 14.7 10/27/2019 0328   LYMPHSABS 2.1 10/25/2019 0354   MONOABS 2.6 (H) 10/25/2019 0354   EOSABS 0.5 10/25/2019 0354   BASOSABS 0.1 10/25/2019 0354    Assessment/Plan: 1. AKI- presumably due to ischemic ATN in setting of hypotension, volume depletion, acute illness, as well as NSAID use and  concomitant ARB therapy.  His serum creatinine was normal a week ago. 1. Continue to hold ARB and lasix  2. Started CRRT on 10/22/19 due to ongoing hypotension and progressive AKI - stopped CRRT on 5/12  1.   BP is better/higher, starting to make urine - labs are good - no need for RRT today.  Because UOP is not robust or at least not recorded that way will keep vascath in today  2. Acute hypoxic respiratory distress- possible aspiration.   Markedly improved , not volume overloaded 3. Bilateral knee injuries with tendon damage as well as menisci. Ortho evaluating but injury occurred 4 weeks ago and his super morbid obesity, AKI, and ileus vs. SBO will delay intervention. 4. N/V/abdominal distension- abd series with dilated loops of bowel, possible ileus vs. SBO- CT scan supports severe ileus. That seems to be improving as well     5. Atrial  fibrillation 6. HTN- holding BP meds while hypotensive.now hypertensive-  Stop IVF and for PRN hydralazine  7. Elytes- will replete K   8. Anemia-  hgb dec likely due to hydration- no action needed yet     [place] [phone]

## 2019-10-27 NOTE — Progress Notes (Signed)
Nutrition Follow up  DOCUMENTATION CODES:   Morbid obesity  INTERVENTION:    Boost Breeze po TID, each supplement provides 250 kcal and 9 grams of protein  MVI daily   NUTRITION DIAGNOSIS:   Inadequate oral intake related to altered GI function as evidenced by NPO status.  Diet advanced to clears   GOAL:   Patient will meet greater than or equal to 90% of their needs   Progressing   MONITOR:   Diet advancement, Labs, Weight trends, Skin, I & O's  REASON FOR ASSESSMENT:   Malnutrition Screening Tool    ASSESSMENT:   Douglas [name] is 49 y.o. male with PMH of CHF, DM, HTN, gout who presents to ER with bilateral knee pain. Patient has been admitted to [facility] for 4 weeks for knee pain after a fall. He has been unable to bear weight since this injury. Hospital was unable to obtain MRI during this time period. Patient reports x-rays were normal.   5/8- start CRRT  RD working remotely.  Stopped CRRT yesterday. Making urine (amount not documented). Ileus resolved. NGT out. Denies nausea, vomiting, or abdominal pain. Started on clear liquid diet. Pt requesting to have Boost. RD to make changes.   Admission weight: 186 kg  Current weight: 181.4 kg   Medications: SS novolog, MVI, miralax, senokot Labs: K 3.3 (L) Cr 1.68-trending down  Diet Order:   Diet Order            Diet clear liquid Room service appropriate? Yes; Fluid consistency: Thin  Diet effective now              EDUCATION NEEDS:   Not appropriate for education at this time  Skin:  Skin Assessment: Skin Integrity Issues: Skin Integrity Issues:: Other (Comment) Incisions: - Other: skin tear to rt and lt buttocks; MASD to back, thigh, scrotum, groin, buttocks  Last BM:  5/12  Height:   Ht Readings from Last 1 Encounters:  10/21/19 6' (1.829 m)    Weight:   Wt Readings from Last 1 Encounters:  10/27/19 (!) 181.4 kg    Ideal Body Weight:  80.9 kg  BMI:  Body mass  index is 54.25 kg/m.  Estimated Nutritional Needs:   Kcal:  2300-2500 kcal  Protein:  125-160 grams  Fluid:  >/= 2.3 L/day   Vanessa Kick RD, LDN Clinical Nutrition Pager listed in AMION

## 2019-10-28 DIAGNOSIS — I480 Paroxysmal atrial fibrillation: Secondary | ICD-10-CM

## 2019-10-28 DIAGNOSIS — I10 Essential (primary) hypertension: Secondary | ICD-10-CM

## 2019-10-28 LAB — RENAL FUNCTION PANEL
Albumin: 2.1 g/dL — ABNORMAL LOW (ref 3.5–5.0)
Albumin: 2.1 g/dL — ABNORMAL LOW (ref 3.5–5.0)
Anion gap: 10 (ref 5–15)
Anion gap: 11 (ref 5–15)
BUN: 25 mg/dL — ABNORMAL HIGH (ref 6–20)
BUN: 26 mg/dL — ABNORMAL HIGH (ref 6–20)
CO2: 23 mmol/L (ref 22–32)
CO2: 24 mmol/L (ref 22–32)
Calcium: 9.4 mg/dL (ref 8.9–10.3)
Calcium: 9.4 mg/dL (ref 8.9–10.3)
Chloride: 108 mmol/L (ref 98–111)
Chloride: 108 mmol/L (ref 98–111)
Creatinine, Ser: 0.96 mg/dL (ref 0.61–1.24)
Creatinine, Ser: 1.02 mg/dL (ref 0.61–1.24)
GFR calc Af Amer: >60 mL/min (ref >60)
GFR calc Af Amer: >60 mL/min (ref >60)
GFR calc non Af Amer: >60 mL/min (ref >60)
GFR calc non Af Amer: >60 mL/min (ref >60)
Glucose, Bld: 105 mg/dL — ABNORMAL HIGH (ref 70–99)
Glucose, Bld: 106 mg/dL — ABNORMAL HIGH (ref 70–99)
Phosphorus: 3.2 mg/dL (ref 2.5–4.6)
Phosphorus: 3.3 mg/dL (ref 2.5–4.6)
Potassium: 3.2 mmol/L — ABNORMAL LOW (ref 3.5–5.1)
Potassium: 3.3 mmol/L — ABNORMAL LOW (ref 3.5–5.1)
Sodium: 142 mmol/L (ref 135–145)
Sodium: 142 mmol/L (ref 135–145)

## 2019-10-28 LAB — CBC
HCT: 34.3 % — ABNORMAL LOW (ref 39.0–52.0)
Hemoglobin: 10.7 g/dL — ABNORMAL LOW (ref 13.0–17.0)
MCH: 27.8 pg (ref 26.0–34.0)
MCHC: 31.2 g/dL (ref 30.0–36.0)
MCV: 89.1 fL (ref 80.0–100.0)
Platelets: 322 10*3/uL (ref 150–400)
RBC: 3.85 MIL/uL — ABNORMAL LOW (ref 4.22–5.81)
RDW: 14.6 % (ref 11.5–15.5)
WBC: 13.3 10*3/uL — ABNORMAL HIGH (ref 4.0–10.5)
nRBC: 0 % (ref 0.0–0.2)

## 2019-10-28 LAB — GLUCOSE, CAPILLARY
Glucose-Capillary: 129 mg/dL — ABNORMAL HIGH (ref 70–99)
Glucose-Capillary: 92 mg/dL (ref 70–99)
Glucose-Capillary: 95 mg/dL (ref 70–99)
Glucose-Capillary: 96 mg/dL (ref 70–99)
Glucose-Capillary: 99 mg/dL (ref 70–99)

## 2019-10-28 LAB — HEPARIN LEVEL (UNFRACTIONATED): Heparin Unfractionated: 0.26 IU/mL — ABNORMAL LOW (ref 0.30–0.70)

## 2019-10-28 LAB — MAGNESIUM: Magnesium: 1.3 mg/dL — ABNORMAL LOW (ref 1.7–2.4)

## 2019-10-28 MED ORDER — ENSURE MAX PROTEIN PO LIQD
11.0000 [oz_av] | Freq: Every day | ORAL | Status: DC
  Administered 2019-10-28 – 2019-11-09 (×9): 11 [oz_av] via ORAL
  Filled 2019-10-28 (×13): qty 330

## 2019-10-28 MED ORDER — POTASSIUM CHLORIDE CRYS ER 20 MEQ PO TBCR
20.0000 meq | EXTENDED_RELEASE_TABLET | Freq: Three times a day (TID) | ORAL | Status: DC
  Administered 2019-10-28 – 2019-11-09 (×35): 20 meq via ORAL
  Filled 2019-10-28 (×35): qty 1

## 2019-10-28 NOTE — Plan of Care (Signed)
  Problem: Education: Goal: Knowledge of General Education information will improve Description: Including pain rating scale, medication(s)/side effects and non-pharmacologic comfort measures Outcome: Progressing   Problem: Health Behavior/Discharge Planning: Goal: Ability to manage health-related needs will improve Outcome: Progressing   Problem: Clinical Measurements: Goal: Ability to maintain clinical measurements within normal limits will improve Outcome: Progressing Goal: Will remain free from infection Outcome: Progressing Goal: Diagnostic test results will improve Outcome: Progressing Goal: Respiratory complications will improve Outcome: Progressing Goal: Cardiovascular complication will be avoided Outcome: Progressing   Problem: Activity: Goal: Risk for activity intolerance will decrease Outcome: Progressing   Problem: Nutrition: Goal: Adequate nutrition will be maintained Outcome: Progressing   Problem: Coping: Goal: Level of anxiety will decrease Outcome: Progressing   Problem: Elimination: Goal: Will not experience complications related to bowel motility Outcome: Progressing Goal: Will not experience complications related to urinary retention Outcome: Progressing   Problem: Skin Integrity: Goal: Risk for impaired skin integrity will decrease Outcome: Progressing   

## 2019-10-28 NOTE — Progress Notes (Signed)
Attempted to put a condom cath on for urinary incontinence, unable to place condom on the patient due to obesity

## 2019-10-28 NOTE — Progress Notes (Signed)
       Subjective: CC: Doing well. Tolerating liquids without n/v. Passing flatus. 4 soft bm's yesterday.   Objective: Vital signs in last 24 hours: Temp:  [97.4 F (36.3 C)-98.6 F (37 C)] 98.3 F (36.8 C) (05/14 0735) Pulse Rate:  [86-104] 97 (05/14 0735) Resp:  [17-24] 17 (05/14 0735) BP: (132-181)/(69-99) 132/77 (05/14 0735) SpO2:  [94 %-98 %] 96 % (05/14 0735) Last BM Date: 10/27/19  Intake/Output from previous day: 05/13 0701 - 05/14 0700 In: 971.6 [P.O.:240; I.V.:387.1; IV Piggyback:344.5] Out: -  Intake/Output this shift: No intake/output data recorded.  PE: Gen: Awake and alert, NAD Lungs: Normal rate and effort Abd: Soft, obese, NT, +BS  Lab Results:  Recent Labs    10/27/19 0328 10/28/19 0428  WBC 15.9* 13.3*  HGB 10.1* 10.7*  HCT 33.6* 34.3*  PLT 311 322   BMET Recent Labs    10/27/19 0328 10/28/19 0428  NA 141 142  142  K 3.3* 3.2*  3.3*  CL 107 108  108  CO2 22 24  23   GLUCOSE 84 105*  106*  BUN 32* 26*  25*  CREATININE 1.68* 0.96  1.02  CALCIUM 9.2 9.4  9.4   PT/INR No results for input(s): LABPROT, INR in the last 72 hours. CMP     Component Value Date/Time   NA 142 10/28/2019 0428   NA 142 10/28/2019 0428   K 3.3 (L) 10/28/2019 0428   K 3.2 (L) 10/28/2019 0428   CL 108 10/28/2019 0428   CL 108 10/28/2019 0428   CO2 23 10/28/2019 0428   CO2 24 10/28/2019 0428   GLUCOSE 106 (H) 10/28/2019 0428   GLUCOSE 105 (H) 10/28/2019 0428   BUN 25 (H) 10/28/2019 0428   BUN 26 (H) 10/28/2019 0428   CREATININE 1.02 10/28/2019 0428   CREATININE 0.96 10/28/2019 0428   CALCIUM 9.4 10/28/2019 0428   CALCIUM 9.4 10/28/2019 0428   PROT 7.3 10/25/2019 0354   ALBUMIN 2.1 (L) 10/28/2019 0428   ALBUMIN 2.1 (L) 10/28/2019 0428   AST 26 10/25/2019 0354   ALT 31 10/25/2019 0354   ALKPHOS 59 10/25/2019 0354   BILITOT 1.2 10/25/2019 0354   GFRNONAA >60 10/28/2019 0428   GFRNONAA >60 10/28/2019 0428   GFRAA >60 10/28/2019 0428   GFRAA >60  10/28/2019 0428   Lipase  No results found for: LIPASE     Studies/Results: No results found.  Anti-infectives: Anti-infectives (From admission, onward)   Start     Dose/Rate Route Frequency Ordered Stop   10/23/19 0000  cefTRIAXone (ROCEPHIN) 1 g in sodium chloride 0.9 % 100 mL IVPB     1 g 200 mL/hr over 30 Minutes Intravenous Every 24 hours 10/22/19 1357 10/30/19 2159       Assessment/Plan ARF on CRRT  Knee injury DM CHF HTN A fib  Ileus - Adv to soft diet - If tolerates, we will s/o  FEN -Soft VTE -heparin for a fib ID -Rocephin for UTI per TRH   LOS: 8 days    [address] , [facility] 10/28/2019, 8:17 AM Please see Amion for pager number during day hours 7:00am-4:30pm

## 2019-10-28 NOTE — Progress Notes (Signed)
Patient ID: Douglas [name], male   DOB: 13-Mar-1971, 49 y.o.   MRN: [id number]   S: moved out of ICU-  No UOP recorded but crt down to 0.96 ! Ileus appears to be resolving as well     O:BP 132/77 (BP Location: Left Wrist)   Pulse 97   Temp 98.3 F (36.8 C) (Oral)   Resp 17   Ht 6' (1.829 m)   Wt (!) 181.4 kg   SpO2 96%   BMI 54.25 kg/m   Intake/Output Summary (Last 24 hours) at 10/28/2019 0820 Last data filed at 10/27/2019 1700 Gross per 24 hour  Intake 971.64 ml  Output -  Net 971.64 ml   Intake/Output: I/O last 3 completed shifts: In: 1214.8 [P.O.:240; I.V.:630.2; IV Piggyback:344.5] Out: -   Intake/Output this shift:  No intake/output data recorded. Weight change:  Gen: obese AAM lying in bed in NAD-  Right IJ vascath - placed 5/8 CVS: tachy at 104 Resp: diminished BS Abd: obese, distended, soft, NT Ext: trace presacral edema vs obesity  Recent Labs  Lab 10/23/19 0321 10/23/19 1550 10/24/19 0358 10/24/19 1652 10/25/19 0354 10/25/19 1515 10/26/19 0415 10/27/19 0328 10/28/19 0428  NA 145   < > 142 142 141 140 140 141 142  142  K 4.2   < > 4.2 3.9 3.7 3.8 3.6 3.3* 3.2*  3.3*  CL 94*   < > 96* 104 103 105 105 107 108  108  CO2 30   < > 26 24 25 25 24 22 24  23   GLUCOSE 115*   < > 114* 112* 96 94 88 84 105*  106*  BUN 70*   < > 43* 34* 30* 27* 24* 32* 26*  25*  CREATININE 6.97*   < > 5.03* 4.13* 3.66* 3.19* 2.62* 1.68* 0.96  1.02  ALBUMIN 3.0*   < > 2.9* 2.6* 2.4* 2.2* 1.9* 2.0* 2.1*  2.1*  CALCIUM 9.3   < > 9.4 9.1 9.0 8.8* 8.7* 9.2 9.4  9.4  PHOS 6.0*   < > 4.1 3.5 3.1 2.5 2.7 3.4 3.2  3.3  AST 25  --   --   --  26  --   --   --   --   ALT 37  --   --   --  31  --   --   --   --    < > = values in this interval not displayed.   Liver Function Tests: Recent Labs  Lab 10/23/19 0321 10/23/19 1550 10/25/19 0354 10/25/19 1515 10/26/19 0415 10/27/19 0328 10/28/19 0428  AST 25  --  26  --   --   --   --   ALT 37  --  31  --   --   --   --    ALKPHOS 66  --  59  --   --   --   --   BILITOT 1.2  --  1.2  --   --   --   --   PROT 8.6*  --  7.3  --   --   --   --   ALBUMIN 3.0*   < > 2.4*   < > 1.9* 2.0* 2.1*  2.1*   < > = values in this interval not displayed.   No results for input(s): LIPASE, AMYLASE in the last 168 hours. No results for input(s): AMMONIA in the last 168 hours. CBC: Recent Labs  Lab 10/23/19  0321 10/23/19 0321 10/24/19 0358 10/24/19 0358 10/25/19 0354 10/25/19 0354 10/26/19 0415 10/27/19 0328 10/28/19 0428  WBC 10.2   < > 10.4   < > 14.2*   < > 16.4* 15.9* 13.3*  NEUTROABS 6.6  --   --   --  8.6*  --   --   --   --   HGB 13.2   < > 13.5   < > 11.5*   < > 10.2* 10.1* 10.7*  HCT 41.5   < > 43.5   < > 39.0   < > 34.0* 33.6* 34.3*  MCV 88.9   < > 91.8  --  94.2  --  94.4 92.1 89.1  PLT 648*   < > 588*   < > 463*   < > 317 311 322   < > = values in this interval not displayed.   Cardiac Enzymes: Recent Labs  Lab 10/21/19 1113  CKTOTAL 344   CBG: Recent Labs  Lab 10/27/19 1100 10/27/19 1643 10/27/19 2102 10/27/19 2356 10/28/19 0538  GLUCAP 109* 93 83 95 92    Iron Studies: No results for input(s): IRON, TIBC, TRANSFERRIN, FERRITIN in the last 72 hours. Studies/Results: No results found. . chlorhexidine  15 mL Mouth Rinse BID  . Chlorhexidine Gluconate Cloth  6 each Topical Q0600  . diltiazem  60 mg Oral Q6H  . feeding supplement  1 Container Oral TID BM  . hydrALAZINE  75 mg Oral Q6H  . insulin aspart  0-9 Units Subcutaneous Q6H  . mouth rinse  15 mL Mouth Rinse q12n4p  . metoprolol succinate  100 mg Oral Daily  . mometasone-formoterol  2 puff Inhalation BID  . multivitamin with minerals  1 tablet Oral Daily  . pantoprazole  40 mg Oral Daily  . polyethylene glycol  17 g Oral Daily  . potassium chloride  20 mEq Oral TID  . senna-docusate  1 tablet Oral BID  . sertraline  50 mg Oral Daily  . sodium chloride flush  10-40 mL Intracatheter Q12H    BMET    Component Value  Date/Time   NA 142 10/28/2019 0428   NA 142 10/28/2019 0428   K 3.3 (L) 10/28/2019 0428   K 3.2 (L) 10/28/2019 0428   CL 108 10/28/2019 0428   CL 108 10/28/2019 0428   CO2 23 10/28/2019 0428   CO2 24 10/28/2019 0428   GLUCOSE 106 (H) 10/28/2019 0428   GLUCOSE 105 (H) 10/28/2019 0428   BUN 25 (H) 10/28/2019 0428   BUN 26 (H) 10/28/2019 0428   CREATININE 1.02 10/28/2019 0428   CREATININE 0.96 10/28/2019 0428   CALCIUM 9.4 10/28/2019 0428   CALCIUM 9.4 10/28/2019 0428   GFRNONAA >60 10/28/2019 0428   GFRNONAA >60 10/28/2019 0428   GFRAA >60 10/28/2019 0428   GFRAA >60 10/28/2019 0428   CBC    Component Value Date/Time   WBC 13.3 (H) 10/28/2019 0428   RBC 3.85 (L) 10/28/2019 0428   HGB 10.7 (L) 10/28/2019 0428   HCT 34.3 (L) 10/28/2019 0428   PLT 322 10/28/2019 0428   MCV 89.1 10/28/2019 0428   MCH 27.8 10/28/2019 0428   MCHC 31.2 10/28/2019 0428   RDW 14.6 10/28/2019 0428   LYMPHSABS 2.1 10/25/2019 0354   MONOABS 2.6 (H) 10/25/2019 0354   EOSABS 0.5 10/25/2019 0354   BASOSABS 0.1 10/25/2019 0354    Assessment/Plan: 1. AKI- presumably due to ischemic ATN in setting of hypotension, volume depletion, acute illness,  as well as NSAID use and concomitant ARB therapy.  His serum creatinine was normal a week ago. 1. Continue to hold ARB and lasix  2. Started CRRT on 10/22/19 due to ongoing hypotension and progressive AKI - stopped CRRT on 5/12  1.   BP is better/higher,  making urine even though not recorded - labs are good - AKI has resolved.  Can remove his temp vascath if not needed for medications 2. Acute hypoxic respiratory distress- possible aspiration.   Markedly improved  3. Bilateral knee injuries with tendon damage as well as menisci. Ortho evaluating but injury occurred 4 weeks ago and his super morbid obesity, AKI, and ileus vs. SBO will delay intervention. 4. N/V/abdominal distension-  ileus vs. SBO- CT scan supports severe ileus. That seems to be improving as well      5. Atrial fibrillation 6. HTN- holding BP meds while hypotensive.now hypertensive-  Stop IVF and for PRN hydralazine  7. Elytes- replete K has already been ordered 8. Anemia-  hgb dec likely due to hydration- no action needed yet   Since renal function recovered, we will sign off.  Can remove vascath when not needed for IV access.  Call with questions     Louis Meckel  [employer] [phone]

## 2019-10-28 NOTE — Progress Notes (Signed)
Nutrition Follow-up  DOCUMENTATION CODES:   Morbid obesity  INTERVENTION:   -D/c Boost Breeze po TID, each supplement provides 250 kcal and 9 grams of protein -Double protein portions with meals -Ensure Max po daily, each supplement provides 150 kcal and 30 grams of protein.   NUTRITION DIAGNOSIS:   Inadequate oral intake related to altered GI function as evidenced by NPO status.  Progressing; advanced to soft diet on 10/27/19  GOAL:   Patient will meet greater than or equal to 90% of their needs  Progressing   MONITOR:   Diet advancement, Labs, Weight trends, Skin, I & O's  REASON FOR ASSESSMENT:   Malnutrition Screening Tool    ASSESSMENT:   Douglas [name] is 49 y.o. male with PMH of CHF, DM, HTN, gout who presents to ER with bilateral knee pain. Patient has been admitted to [facility] for 4 weeks for knee pain after a fall. He has been unable to bear weight since this injury. Hospital was unable to obtain MRI during this time period. Patient reports x-rays were normal.  5/8- start CRRT 5/12- CRRT d/c 5/14- advanced to soft diet  Reviewed I/O's: +972 ml x 24 hours and -1.3 L since admission   Attempted to speak with pt via phone, however, no answer.   Pt just advanced to soft diet. He was tolerating clear liquid diet and Boost Breeze supplements well.   Medications reviewed and include KCl, senna, and miralax.   Labs reviewed: CBGS: 83-95 (inpatient orders for glycemic control are 0-9 units insulin aspart every 6 hours).   Diet Order:   Diet Order            DIET SOFT Room service appropriate? Yes; Fluid consistency: Thin  Diet effective now              EDUCATION NEEDS:   Not appropriate for education at this time  Skin:  Skin Assessment: Skin Integrity Issues: Skin Integrity Issues:: Other (Comment) Incisions: - Other: skin tear to rt and lt buttocks; MASD to back, thigh, scrotum, groin, buttocks  Last BM:  10/27/19  Height:    Ht Readings from Last 1 Encounters:  10/21/19 6' (1.829 m)    Weight:   Wt Readings from Last 1 Encounters:  10/27/19 (!) 181.4 kg    Ideal Body Weight:  80.9 kg  BMI:  Body mass index is 54.25 kg/m.  Estimated Nutritional Needs:   Kcal:  2300-2500 kcal  Protein:  125-160 grams  Fluid:  >/= 2.3 L/day    Levada Schilling, RD, LDN, CDCES Registered Dietitian II Certified Diabetes Care and Education Specialist Please refer to [facility] for RD and/or RD on-call/weekend/after hours pager

## 2019-10-28 NOTE — Progress Notes (Signed)
Patient stated he has not been wearing CPAP at nigh, only oxygen.

## 2019-10-28 NOTE — Progress Notes (Signed)
ANTICOAGULATION CONSULT NOTE  Pharmacy Consult for heparin Indication: Afib  Assessment: 49 yo male with h/o AFib, Eliquis on hold, on heparin drip Heparin level this am 0.26 units/ml  Goal of Therapy:  Heparin level 0.3-0.7  Monitor platelets by anticoagulation protocol: Yes   Plan:  Increase heparin infusion to 2300 units/hr Monitor daily HL, CBC, and for s/sx of bleeding   Thanks for allowing pharmacy to be a part of this patient's care.  Talbert Cage, PharmD Clinical Pharmacist

## 2019-10-28 NOTE — Progress Notes (Addendum)
PROGRESS NOTE    Douglas [name]  VOZ:[id number] DOB: December 21, 1970 DOA: 10/20/2019 PCP: Neale Burly, MD   Brief Narrative:  This is a 49 year old gentleman history of diabetes hypertension came to the hospital after mechanical fall which occurred several weeks ago.  Patient was found to have acute renal failure.  And concern for SBO versus ileus.  NG tube was placed.  A rapid response occurred the night of 10/21/2019.  He had a significant amount of NG output.  He was also given 1 L fluid bolus.  Patient was seen by surgery for ileus.  Recommending continued NG tube decompression.  Echocardiogram was completed today.  Renal ultrasound pending.  Patient had worsening kidney function, initial serum creatinine of 2.5 up to 3.24.  Now serum creatinine 6.25 with a serum bicarb of 24.  Nephrology was consulted for evaluation.  Due to low blood pressures and very little urine output PCCM was consulted for recommendations transferred to the intensive care unit for close observation and likely initiation of CVVHD.  Patient does have history of paroxysmal atrial fibrillation.  Was on Eliquis PTA.  Switch to heparin infusion.  Assessment & Plan:   Active Problems:   Diabetes mellitus without complication (HCC)   CHF (congestive heart failure) (HCC)   Gout   Hypertension   Sleep apnea   Atrial fibrillation (HCC)   Rupture of left quadriceps tendon   Rupture of patellar tendon, right, initial encounter   AKI (acute kidney injury) ([place])   Knee pain   AKI presumably due to ischemic ATN/hypotension/dehydration, POA - Continue to hold ARB and Lasix; NSAIDs discontinued at admission - CRRT 10/22/2019 through 10/26/2019, improving drastically - Urine output continues to improve drastically, creatinine downtrending appropriately now WNL Lab Results  Component Value Date   CREATININE 1.02 10/28/2019   CREATININE 0.96 10/28/2019   CREATININE 1.68 (H) 10/27/2019    Shock multifactorial in the setting  of AKI, hypotension, dehydration, ileus,  Questionably concurrent UTI - Shock criteria resolved with supportive care, IV fluids - Ceftriaxone continue for 7 days - stop date 10/29/2019  Ileus, resolved  - Likely related to renal failure; improved - Continue to advance diet as tolerated  Paroxysmal atrial fibrillation, improved -Continues in sinus rhythm currently - Continue IV heparin for coverage -CHA2DS2-VASc of 3  History of chronic systolic heart failure, EF 50 to 55% - Repeat echo poor window given body habitus - Once blood pressure can tolerate and AKI has resolved would likely place patient back on core measures including ARB beta-blocker, diuretics -consider Entresto -Currently on beta-blocker  Hypertension, essential  - Hydralazine increased, on CCB, restarted PTA BB.  Would be good candidate for entresto as OP.  Type 2 diabetes, hyperglycemia - Resolved with lack of PO - Continue to monitor given lack of symptoms -Continue sliding scale insulin, patient has not required sliding coverage over the past 24 hours  Knee injury with left high-grade quadriceps tear and medial retinacular tear, possible lateral meniscus tear right complete patella tendon rupture -Orthopedics previously following, spoke with on-call PA, will have orthopedics reevaluate patient for possible inpatient procedure versus outpatient follow-up per their expertise.  DVT prophylaxis: Heparin drip Code Status: Full Family Communication: None present  Status is: Inpatient  Dispo: The patient is from: Home              Anticipated d/c is to: Home pending surgical and PT evaluation              Anticipated d/c  date is: To be determined, likely 48 to 72 hours pending further evaluation with surgery and PT              Patient currently not medically stable for discharge, continues to require IV anticoagulation, close monitoring in the setting of kidney failure, poor volume status poor p.o. intake and  ambulatory dysfunction  Consultants:   PCCM, nephrology  Procedures:   CRRT 5/8 -10/26/19  Antimicrobials:  Ceftriaxone, stop date 10/29/2019  Subjective: No acute issues or events overnight, patient feels quite well compared to previous, denies chest pain, shortness of breath, nausea, vomiting, diarrhea, constipation, headache, fevers, chills.  Objective: Vitals:   10/27/19 2053 10/27/19 2358 10/28/19 0255 10/28/19 0533  BP:  (!) 141/85 (!) 142/82 (!) 143/72  Pulse:   86 89  Resp:   17   Temp:   98 F (36.7 C)   TempSrc:   Oral   SpO2: 96%  97% 96%  Weight:      Height:        Intake/Output Summary (Last 24 hours) at 10/28/2019 0725 Last data filed at 10/27/2019 1700 Gross per 24 hour  Intake 971.64 ml  Output --  Net 971.64 ml   Filed Weights   10/24/19 0406 10/25/19 0440 10/27/19 0330  Weight: (!) 181 kg (!) 186 kg (!) 181.4 kg    Examination:  General:  Pleasantly resting in bed, No acute distress. HEENT:  Normocephalic atraumatic.  Sclerae nonicteric, noninjected.  Extraocular movements intact bilaterally. Neck:  Without mass or deformity.  Trachea is midline. Right IJ dialysis catheter Lungs:  Clear to auscultate bilaterally without rhonchi, wheeze, or rales. Heart:  Regular rate and rhythm.  Without murmurs, rubs, or gallops. Abdomen:  Soft, nontender, nondistended.  Without guarding or rebound. Extremities: Without cyanosis, clubbing, edema, or obvious deformity. Vascular:  Dorsalis pedis and posterior tibial pulses palpable bilaterally. Skin:  Warm and dry, no erythema, no ulcerations.   Data Reviewed: I have personally reviewed following labs and imaging studies  CBC: Recent Labs  Lab 10/23/19 0321 10/23/19 0321 10/24/19 0358 10/25/19 0354 10/26/19 0415 10/27/19 0328 10/28/19 0428  WBC 10.2   < > 10.4 14.2* 16.4* 15.9* 13.3*  NEUTROABS 6.6  --   --  8.6*  --   --   --   HGB 13.2   < > 13.5 11.5* 10.2* 10.1* 10.7*  HCT 41.5   < > 43.5 39.0  34.0* 33.6* 34.3*  MCV 88.9   < > 91.8 94.2 94.4 92.1 89.1  PLT 648*   < > 588* 463* 317 311 322   < > = values in this interval not displayed.   Basic Metabolic Panel: Recent Labs  Lab 10/24/19 0358 10/24/19 1652 10/25/19 0354 10/25/19 1515 10/26/19 0415 10/27/19 0328 10/28/19 0428  NA 142   < > 141 140 140 141 142  142  K 4.2   < > 3.7 3.8 3.6 3.3* 3.2*  3.3*  CL 96*   < > 103 105 105 107 108  108  CO2 26   < > 25 25 24 22 24  23   GLUCOSE 114*   < > 96 94 88 84 105*  106*  BUN 43*   < > 30* 27* 24* 32* 26*  25*  CREATININE 5.03*   < > 3.66* 3.19* 2.62* 1.68* 0.96  1.02  CALCIUM 9.4   < > 9.0 8.8* 8.7* 9.2 9.4  9.4  MG 2.7*  --  2.4  --  2.4 1.9 1.3*  PHOS 4.1   < > 3.1 2.5 2.7 3.4 3.2  3.3   < > = values in this interval not displayed.   GFR: Estimated Creatinine Clearance: 146 mL/min (by C-G formula based on SCr of 1.02 mg/dL). Liver Function Tests: Recent Labs  Lab 10/23/19 0321 10/23/19 1550 10/25/19 0354 10/25/19 1515 10/26/19 0415 10/27/19 0328 10/28/19 0428  AST 25  --  26  --   --   --   --   ALT 37  --  31  --   --   --   --   ALKPHOS 66  --  59  --   --   --   --   BILITOT 1.2  --  1.2  --   --   --   --   PROT 8.6*  --  7.3  --   --   --   --   ALBUMIN 3.0*   < > 2.4* 2.2* 1.9* 2.0* 2.1*  2.1*   < > = values in this interval not displayed.   No results for input(s): LIPASE, AMYLASE in the last 168 hours. No results for input(s): AMMONIA in the last 168 hours. Coagulation Profile: No results for input(s): INR, PROTIME in the last 168 hours. Cardiac Enzymes: Recent Labs  Lab 10/21/19 1113  CKTOTAL 344   BNP (last 3 results) No results for input(s): PROBNP in the last 8760 hours. HbA1C: No results for input(s): HGBA1C in the last 72 hours. CBG: Recent Labs  Lab 10/27/19 1100 10/27/19 1643 10/27/19 2102 10/27/19 2356 10/28/19 0538  GLUCAP 109* 93 83 95 92   Lipid Profile: No results for input(s): CHOL, HDL, LDLCALC, TRIG,  CHOLHDL, LDLDIRECT in the last 72 hours. Thyroid Function Tests: No results for input(s): TSH, T4TOTAL, FREET4, T3FREE, THYROIDAB in the last 72 hours. Anemia Panel: No results for input(s): VITAMINB12, FOLATE, FERRITIN, TIBC, IRON, RETICCTPCT in the last 72 hours. Sepsis Labs: Recent Labs  Lab 10/21/19 2132 10/22/19 0220 10/23/19 1550  PROCALCITON  --   --  2.48  LATICACIDVEN 1.5 1.7  --     Recent Results (from the past 240 hour(s))  Respiratory Panel by RT PCR (Flu A&B, Covid) - Nasopharyngeal Swab     Status: None   Collection Time: 10/20/19 10:37 PM   Specimen: Nasopharyngeal Swab  Result Value Ref Range Status   SARS Coronavirus 2 by RT PCR NEGATIVE NEGATIVE Final    Comment: (NOTE) SARS-CoV-2 target nucleic acids are NOT DETECTED. The SARS-CoV-2 RNA is generally detectable in upper respiratoy specimens during the acute phase of infection. The lowest concentration of SARS-CoV-2 viral copies this assay can detect is 131 copies/mL. A negative result does not preclude SARS-Cov-2 infection and should not be used as the sole basis for treatment or other patient management decisions. A negative result may occur with  improper specimen collection/handling, submission of specimen other than nasopharyngeal swab, presence of viral mutation(s) within the areas targeted by this assay, and inadequate number of viral copies (<131 copies/mL). A negative result must be combined with clinical observations, patient history, and epidemiological information. The expected result is Negative. Fact Sheet for Patients:  https://www.moore.com/ Fact Sheet for Healthcare Providers:  https://www.young.biz/ This test is not yet ap proved or cleared by the [place] FDA and  has been authorized for detection and/or diagnosis of SARS-CoV-2 by FDA under an Emergency Use Authorization (EUA). This EUA will remain  in effect (meaning this test can be  used) for  the duration of the COVID-19 declaration under Section 564(b)(1) of the Act, 21 U.S.C. section 360bbb-3(b)(1), unless the authorization is terminated or revoked sooner.    Influenza A by PCR NEGATIVE NEGATIVE Final   Influenza B by PCR NEGATIVE NEGATIVE Final    Comment: (NOTE) The Xpert Xpress SARS-CoV-2/FLU/RSV assay is intended as an aid in  the diagnosis of influenza from Nasopharyngeal swab specimens and  should not be used as a sole basis for treatment. Nasal washings and  aspirates are unacceptable for Xpert Xpress SARS-CoV-2/FLU/RSV  testing. Fact Sheet for Patients: https://www.moore.com/ Fact Sheet for Healthcare Providers: https://www.young.biz/ This test is not yet approved or cleared by the [place] FDA and  has been authorized for detection and/or diagnosis of SARS-CoV-2 by  FDA under an Emergency Use Authorization (EUA). This EUA will remain  in effect (meaning this test can be used) for the duration of the  Covid-19 declaration under Section 564(b)(1) of the Act, 21  U.S.C. section 360bbb-3(b)(1), unless the authorization is  terminated or revoked. Performed at [facility], [address], [place], [place] [address]   Culture, blood (routine x 2)     Status: None   Collection Time: 10/21/19  9:38 PM   Specimen: BLOOD  Result Value Ref Range Status   Specimen Description BLOOD LEFT HAND  Final   Special Requests   Final    BOTTLES DRAWN AEROBIC AND ANAEROBIC Blood Culture adequate volume   Culture   Final    NO GROWTH 5 DAYS Performed at [facility], [address], [place], [place] [address]    Report Status 10/26/2019 FINAL  Final  Culture, blood (routine x 2)     Status: None   Collection Time: 10/21/19  9:40 PM   Specimen: BLOOD  Result Value Ref Range Status   Specimen Description BLOOD RIGHT HAND  Final   Special Requests   Final    BOTTLES DRAWN AEROBIC AND ANAEROBIC Blood Culture adequate  volume   Culture   Final    NO GROWTH 5 DAYS Performed at [facility], [address], [place], [place] [address]    Report Status 10/26/2019 FINAL  Final  MRSA PCR Screening     Status: None   Collection Time: 10/22/19  5:31 AM   Specimen: Nasopharyngeal  Result Value Ref Range Status   MRSA by PCR NEGATIVE NEGATIVE Final    Comment:        The GeneXpert MRSA Assay (FDA approved for NASAL specimens only), is one component of a comprehensive MRSA colonization surveillance program. It is not intended to diagnose MRSA infection nor to guide or monitor treatment for MRSA infections. Performed at [facility], [address], [place], [place] [address]          Radiology Studies: No results found.      Scheduled Meds: . chlorhexidine  15 mL Mouth Rinse BID  . Chlorhexidine Gluconate Cloth  6 each Topical Q0600  . diltiazem  60 mg Oral Q6H  . feeding supplement  1 Container Oral TID BM  . hydrALAZINE  75 mg Oral Q6H  . insulin aspart  0-9 Units Subcutaneous Q6H  . mouth rinse  15 mL Mouth Rinse q12n4p  . metoprolol succinate  100 mg Oral Daily  . mometasone-formoterol  2 puff Inhalation BID  . multivitamin with minerals  1 tablet Oral Daily  . pantoprazole  40 mg Oral Daily  . polyethylene  glycol  17 g Oral Daily  . potassium chloride  20 mEq Oral TID  . senna-docusate  1 tablet Oral BID  . sertraline  50 mg Oral Daily  . sodium chloride flush  10-40 mL Intracatheter Q12H   Continuous Infusions: . cefTRIAXone (ROCEPHIN)  IV 1 g (10/27/19 2236)  . heparin 2,300 Units/hr (10/28/19 0529)  . norepinephrine (LEVOPHED) Adult infusion Stopped (10/25/19 0753)     LOS: 8 days   Time spent: 45 min  Azucena Fallen, DO [facility]  If 7PM-7AM, please contact night-coverage [email or web address]  10/28/2019, 7:25 AM

## 2019-10-28 NOTE — TOC Initial Note (Signed)
Transition of Care [facility]) - Initial/Assessment Note    Patient Details  Name: Douglas [name] MRN: [id number] Date of Birth: 02-02-1971  Transition of Care [facility]) CM/SW Contact:    Epifanio Lesches, RN Phone Number: [phone] 10/28/2019, 4:41 PM  Clinical Narrative:   Pt admitted s/p mechanical fall which occurred several weeks ago, hx of DM, HTN, a. Fib ( Eliquis), morbid obesity. Pt with acute renal failure, SBO versus ileus.   From home with wife, Geri [place]. States was in hospital x1 month in [place] prior to current admit.  Rogers Ditter (Spouse)    [phone]      Orthopedics to reevaluate patient for possible inpatient procedure versus outpatient follow-up   TOC team following for TOC needs ...  Expected Discharge Plan: Skilled Nursing Facility(CIR vs home with home health services) Barriers to Discharge: Continued Medical Work up   Patient Goals and CMS Choice        Expected Discharge Plan and Services Expected Discharge Plan: Skilled Nursing Facility(CIR vs home with home health services)   Discharge Planning Services: CM Consult   Living arrangements for the past 2 months: Single Family Home                      Prior Living Arrangements/Services Living arrangements for the past 2 months: Single Family Home Lives with:: Spouse                   Activities of Daily Living Home Assistive Devices/Equipment: Cane (specify quad or straight) ADL Screening (condition at time of admission) Patient's cognitive ability adequate to safely complete daily activities?: Yes Is the patient deaf or have difficulty hearing?: No Does the patient have difficulty seeing, even when wearing glasses/contacts?: No Does the patient have difficulty concentrating, remembering, or making decisions?: No Patient able to express need for assistance with ADLs?: Yes Does the patient have difficulty dressing or bathing?: Yes Independently performs ADLs?:  No Communication: Independent Dressing (OT): Needs assistance Is this a change from baseline?: Pre-admission baseline Grooming: Needs assistance Is this a change from baseline?: Pre-admission baseline Feeding: Independent Bathing: Dependent Is this a change from baseline?: Pre-admission baseline Toileting: Dependent Is this a change from baseline?: Pre-admission baseline In/Out Bed: Dependent Is this a change from baseline?: Pre-admission baseline Walks in Home: Dependent Is this a change from baseline?: Pre-admission baseline Does the patient have difficulty walking or climbing stairs?: Yes Weakness of Legs: Both Weakness of Arms/Hands: Both  Permission Sought/Granted                  Emotional Assessment              Admission diagnosis:  Knee pain [M25.569] Renal failure [N19] Fall, initial encounter [W19.XXXA] Rupture of right quadriceps tendon, subsequent encounter [S76.111D] Patient Active Problem List   Diagnosis Date Noted  . Hypertension 10/20/2019  . Sleep apnea 10/20/2019  . Atrial fibrillation (HCC) 10/20/2019  . Knee pain 10/20/2019  . Diabetes mellitus without complication (HCC)   . CHF (congestive heart failure) (HCC)   . Gout   . Rupture of left quadriceps tendon   . Rupture of patellar tendon, right, initial encounter   . AKI (acute kidney injury) [facility])    PCP:  Toma Deiters, MD Pharmacy:   [facility] [address], [place] - [address] [place] [address] Phone: [phone] Fax: [phone]     Social Determinants of Health (SDOH) Interventions  Readmission Risk Interventions No flowsheet data found.

## 2019-10-29 LAB — RENAL FUNCTION PANEL
Albumin: 2.2 g/dL — ABNORMAL LOW (ref 3.5–5.0)
Anion gap: 7 (ref 5–15)
BUN: 24 mg/dL — ABNORMAL HIGH (ref 6–20)
CO2: 25 mmol/L (ref 22–32)
Calcium: 9 mg/dL (ref 8.9–10.3)
Chloride: 109 mmol/L (ref 98–111)
Creatinine, Ser: 0.96 mg/dL (ref 0.61–1.24)
GFR calc Af Amer: >60 mL/min (ref >60)
GFR calc non Af Amer: >60 mL/min (ref >60)
Glucose, Bld: 108 mg/dL — ABNORMAL HIGH (ref 70–99)
Phosphorus: 3.7 mg/dL (ref 2.5–4.6)
Potassium: 3.4 mmol/L — ABNORMAL LOW (ref 3.5–5.1)
Sodium: 141 mmol/L (ref 135–145)

## 2019-10-29 LAB — CBC
HCT: 33.7 % — ABNORMAL LOW (ref 39.0–52.0)
Hemoglobin: 10.5 g/dL — ABNORMAL LOW (ref 13.0–17.0)
MCH: 27.8 pg (ref 26.0–34.0)
MCHC: 31.2 g/dL (ref 30.0–36.0)
MCV: 89.2 fL (ref 80.0–100.0)
Platelets: 308 10*3/uL (ref 150–400)
RBC: 3.78 MIL/uL — ABNORMAL LOW (ref 4.22–5.81)
RDW: 14.6 % (ref 11.5–15.5)
WBC: 13.3 10*3/uL — ABNORMAL HIGH (ref 4.0–10.5)
nRBC: 0.2 % (ref 0.0–0.2)

## 2019-10-29 LAB — GLUCOSE, CAPILLARY
Glucose-Capillary: 92 mg/dL (ref 70–99)
Glucose-Capillary: 95 mg/dL (ref 70–99)
Glucose-Capillary: 94 mg/dL (ref 70–99)
Glucose-Capillary: 136 mg/dL — ABNORMAL HIGH (ref 70–99)
Glucose-Capillary: 102 mg/dL — ABNORMAL HIGH (ref 70–99)

## 2019-10-29 LAB — MAGNESIUM: Magnesium: 1.2 mg/dL — ABNORMAL LOW (ref 1.7–2.4)

## 2019-10-29 LAB — HEPARIN LEVEL (UNFRACTIONATED): Heparin Unfractionated: 0.35 IU/mL (ref 0.30–0.70)

## 2019-10-29 MED ORDER — MAGNESIUM SULFATE 2 GM/50ML IV SOLN
2.0000 g | Freq: Once | INTRAVENOUS | Status: AC
  Administered 2019-10-29: 2 g via INTRAVENOUS
  Filled 2019-10-29: qty 50

## 2019-10-29 NOTE — Evaluation (Signed)
Physical Therapy Evaluation Patient Details Name: Douglas [name] MRN: [id number] DOB: 1970-11-06 Today's Date: 10/29/2019   History of Present Illness  49 yo male who has not walked in four weeks was admitted, noted his pain in knees from a recent fall that led to nonambulatory status.  Pt is now in acute renal failure, had low BP, shock,  and AKI with hypotension, acute renal failure.  Per MRI has L quad tear, medial retinacular tear, R patellar tendon rupture, lateral meniscal tear. PMHx:  CHF, EF 50-55%, PAF, ileus, HTN,   Clinical Impression  Pt had agreement to get a trapeze bar, and will anticipate his plan to go  To rehab once orthopedic plan is done.  Will work on strength and balance as his legs permit, sliding transfers for now.  Follow acutely to work on bed to chair transfers, CIR requested for follow up.    Follow Up Recommendations CIR    Equipment Recommendations  None recommended by PT    Recommendations for Other Services Rehab consult     Precautions / Restrictions Precautions Precautions: Fall Restrictions Weight Bearing Restrictions: No Other Position/Activity Restrictions: pt is not tolerant of standing      Mobility  Bed Mobility Overal bed mobility: Needs Assistance Bed Mobility: Supine to Sit;Sit to Supine     Supine to sit: Mod assist Sit to supine: Mod assist      Transfers                 General transfer comment: deferred  Ambulation/Gait                Stairs            Wheelchair Mobility    Modified Rankin (Stroke Patients Only)       Balance Overall balance assessment: Needs assistance;History of Falls Sitting-balance support: Feet supported Sitting balance-Leahy Scale: Fair                                       Pertinent Vitals/Pain Pain Assessment: Faces Faces Pain Scale: Hurts a little bit Pain Location: knees Pain Descriptors / Indicators: Guarding Pain Intervention(s):  Repositioned;Premedicated before session    Home Living Family/patient expects to be discharged to:: Private residence Living Arrangements: Spouse/significant other Available Help at Discharge: Family;Available PRN/intermittently Type of Home: House       Home Layout: One level Home Equipment: None      Prior Function Level of Independence: Independent               Hand Dominance   Dominant Hand: Right    Extremity/Trunk Assessment   Upper Extremity Assessment Upper Extremity Assessment: Overall WFL for tasks assessed    Lower Extremity Assessment Lower Extremity Assessment: Generalized weakness    Cervical / Trunk Assessment Cervical / Trunk Assessment: Normal  Communication   Communication: No difficulties  Cognition Arousal/Alertness: Awake/alert Behavior During Therapy: WFL for tasks assessed/performed Overall Cognitive Status: Within Functional Limits for tasks assessed                                        General Comments General comments (skin integrity, edema, etc.): pt was assisted to side of bed with mod assist and with min assist can scoot up side of bed    Exercises  Assessment/Plan    PT Assessment Patient needs continued PT services  PT Problem List Decreased strength;Decreased range of motion;Decreased activity tolerance;Decreased balance;Decreased mobility;Decreased coordination;Decreased knowledge of use of DME;Decreased safety awareness;Cardiopulmonary status limiting activity;Obesity;Pain       PT Treatment Interventions DME instruction;Gait training;Functional mobility training;Therapeutic activities;Therapeutic exercise;Balance training;Neuromuscular re-education;Patient/family education    PT Goals (Current goals can be found in the Care Plan section)  Acute Rehab PT Goals Patient Stated Goal: to get home and able to walk PT Goal Formulation: With patient Time For Goal Achievement: 11/12/19 Potential to  Achieve Goals: Good    Frequency Min 3X/week   Barriers to discharge Inaccessible home environment;Decreased caregiver support home with limited help    Co-evaluation               AM-PAC PT "6 Clicks" Mobility  Outcome Measure Help needed turning from your back to your side while in a flat bed without using bedrails?: A Lot Help needed moving from lying on your back to sitting on the side of a flat bed without using bedrails?: A Lot Help needed moving to and from a bed to a chair (including a wheelchair)?: A Lot Help needed standing up from a chair using your arms (e.g., wheelchair or bedside chair)?: Total Help needed to walk in hospital room?: Total Help needed climbing 3-5 steps with a railing? : Total 6 Click Score: 9    End of Session   Activity Tolerance: Treatment limited secondary to medical complications (Comment) Patient left: in bed;with call bell/phone within reach;with bed alarm set Nurse Communication: Mobility status PT Visit Diagnosis: Muscle weakness (generalized) (M62.81);History of falling (Z91.81)    Time: 0175-1025 PT Time Calculation (min) (ACUTE ONLY): 21 min   Charges:   PT Evaluation $PT Eval Moderate Complexity: 1 Mod         Ivar Drape 10/29/2019, 11:57 PM  Samul Dada, PT MS Acute Rehab Dept. Number: [facility] [id number] and [facility] [phone]

## 2019-10-29 NOTE — Plan of Care (Signed)

## 2019-10-29 NOTE — Progress Notes (Signed)
PROGRESS NOTE    Douglas [name]  NID:[id number] DOB: 01/18/1971 DOA: 10/20/2019 PCP: Toma Deiters, MD   Brief Narrative:  49 year old gentleman history of diabetes hypertension came to the hospital after mechanical fall which occurred several weeks ago.  Patient was found to have acute renal failure.  And concern for SBO versus ileus.  NG tube was placed. Patient was seen by surgery for ileus.  Recommending continued NG tube decompression. Patient had worsening kidney function, nephrology was consulted for evaluation.  Due to low blood pressures and very little urine output PCCM was consulted for recommendations transferred to the intensive care unit for close observation and likely initiation of CVVHD.  Patient does have history of paroxysmal atrial fibrillation.  Was on Eliquis PTA.  Switched to heparin infusion for now pending possible surgical intervention.  Assessment & Plan:   Active Problems:   Diabetes mellitus without complication (HCC)   CHF (congestive heart failure) (HCC)   Gout   Hypertension   Sleep apnea   Atrial fibrillation (HCC)   Rupture of left quadriceps tendon   Rupture of patellar tendon, right, initial encounter   AKI (acute kidney injury) (HCC)   Knee pain   AKI presumably due to ischemic ATN/hypotension/dehydration, POA - Continue to hold ARB and Lasix; NSAIDs discontinued at admission - CRRT 10/22/2019 through 10/26/2019, improving drastically - Urine output continues to improve drastically, creatinine downtrending appropriately now WNL Lab Results  Component Value Date   CREATININE 0.96 10/29/2019   CREATININE 1.02 10/28/2019   CREATININE 0.96 10/28/2019    Shock multifactorial in the setting of AKI, hypotension, dehydration, ileus,  Questionably concurrent UTI - Shock criteria resolved with supportive care, IV fluids - Ceftriaxone continue for 7 days - stop date 10/29/2019  Ileus, resolved  - Likely related to renal failure; improved -  Continue to advance diet as tolerated  Paroxysmal atrial fibrillation, improved -Continues in sinus rhythm currently - Continue IV heparin for coverage -CHA2DS2-VASc of 3 -Continue diltiazem, metoprolol  History of chronic systolic heart failure, EF 50 to 55% - Repeat echo poor window given body habitus -Continue metoprolol -Continue to hold home Lasix, losartan for now  Hypertension, essential  - Hydralazine increased, diltiazem, metoprolol - Would be good candidate for entresto as OP  Type 2 diabetes mellitus -Last A1c 6.8 on 10/21/2019 -Continue sliding scale insulin, hypoglycemic protocol, Accu-Cheks  Knee injury with left high-grade quadriceps tear and medial retinacular tear, possible lateral meniscus tear right complete patella tendon rupture -Orthopedics previously following, spoke with on-call PA, will have orthopedics reevaluate patient for possible inpatient procedure versus outpatient follow-up per their expertise  Morbid obesity -Lifestyle modification advised     DVT prophylaxis: Heparin drip Code Status: Full Family Communication: Discussed with patient  Status is: Inpatient  Dispo: The patient is from: Home              Anticipated d/c is to: Home pending surgical and PT evaluation              Anticipated d/c date is: To be determined, likely 48 to 72 hours pending further evaluation with surgery and PT              Patient currently not medically stable for discharge, continues to require IV anticoagulation, close monitoring in the setting of kidney failure, poor volume status poor p.o. intake and ambulatory dysfunction  Consultants:   PCCM, nephrology, orthopedics  Procedures:   CRRT 5/8 -10/26/19  Antimicrobials:  Ceftriaxone, stop date 10/29/2019  Subjective: Patient denies any new complaints, denies any chest pain, abdominal pain, shortness of breath, nausea/vomiting, fever/chills.    Objective: Vitals:   10/28/19 1952 10/29/19 0438  10/29/19 0815 10/29/19 0917  BP: 137/73 139/75 138/82   Pulse: 89 85 91   Resp: 16 17 18    Temp: 97.9 F (36.6 C) 98.6 F (37 C) 98.3 F (36.8 C)   TempSrc: Oral Oral Oral   SpO2: 95% 94% 97% 98%  Weight:      Height:        Intake/Output Summary (Last 24 hours) at 10/29/2019 1328 Last data filed at 10/29/2019 0900 Gross per 24 hour  Intake 250 ml  Output --  Net 250 ml   Filed Weights   10/24/19 0406 10/25/19 0440 10/27/19 0330  Weight: (!) 181 kg (!) 186 kg (!) 181.4 kg    Examination:  General: NAD, morbidly obese  Cardiovascular: S1, S2 present  Respiratory: CTAB  Abdomen: Soft, nontender, nondistended, bowel sounds present  Musculoskeletal: No bilateral pedal edema noted  Skin: Normal  Psychiatry: Normal mood   Data Reviewed: I have personally reviewed following labs and imaging studies  CBC: Recent Labs  Lab 10/23/19 0321 10/24/19 0358 10/25/19 0354 10/26/19 0415 10/27/19 0328 10/28/19 0428 10/29/19 0341  WBC 10.2   < > 14.2* 16.4* 15.9* 13.3* 13.3*  NEUTROABS 6.6  --  8.6*  --   --   --   --   HGB 13.2   < > 11.5* 10.2* 10.1* 10.7* 10.5*  HCT 41.5   < > 39.0 34.0* 33.6* 34.3* 33.7*  MCV 88.9   < > 94.2 94.4 92.1 89.1 89.2  PLT 648*   < > 463* 317 311 322 308   < > = values in this interval not displayed.   Basic Metabolic Panel: Recent Labs  Lab 10/25/19 0354 10/25/19 0354 10/25/19 1515 10/26/19 0415 10/27/19 0328 10/28/19 0428 10/29/19 0341  NA 141   < > 140 140 141 142  142 141  K 3.7   < > 3.8 3.6 3.3* 3.2*  3.3* 3.4*  CL 103   < > 105 105 107 108  108 109  CO2 25   < > 25 24 22 24  23 25   GLUCOSE 96   < > 94 88 84 105*  106* 108*  BUN 30*   < > 27* 24* 32* 26*  25* 24*  CREATININE 3.66*   < > 3.19* 2.62* 1.68* 0.96  1.02 0.96  CALCIUM 9.0   < > 8.8* 8.7* 9.2 9.4  9.4 9.0  MG 2.4  --   --  2.4 1.9 1.3* 1.2*  PHOS 3.1   < > 2.5 2.7 3.4 3.2  3.3 3.7   < > = values in this interval not displayed.   GFR: Estimated  Creatinine Clearance: 155.1 mL/min (by C-G formula based on SCr of 0.96 mg/dL). Liver Function Tests: Recent Labs  Lab 10/23/19 0321 10/23/19 1550 10/25/19 0354 10/25/19 0354 10/25/19 1515 10/26/19 0415 10/27/19 0328 10/28/19 0428 10/29/19 0341  AST 25  --  26  --   --   --   --   --   --   ALT 37  --  31  --   --   --   --   --   --   ALKPHOS 66  --  59  --   --   --   --   --   --   BILITOT  1.2  --  1.2  --   --   --   --   --   --   PROT 8.6*  --  7.3  --   --   --   --   --   --   ALBUMIN 3.0*   < > 2.4*   < > 2.2* 1.9* 2.0* 2.1*  2.1* 2.2*   < > = values in this interval not displayed.   No results for input(s): LIPASE, AMYLASE in the last 168 hours. No results for input(s): AMMONIA in the last 168 hours. Coagulation Profile: No results for input(s): INR, PROTIME in the last 168 hours. Cardiac Enzymes: No results for input(s): CKTOTAL, CKMB, CKMBINDEX, TROPONINI in the last 168 hours. BNP (last 3 results) No results for input(s): PROBNP in the last 8760 hours. HbA1C: No results for input(s): HGBA1C in the last 72 hours. CBG: Recent Labs  Lab 10/28/19 1629 10/28/19 2102 10/29/19 0631 10/29/19 0816 10/29/19 1158  GLUCAP 99 96 92 95 94   Lipid Profile: No results for input(s): CHOL, HDL, LDLCALC, TRIG, CHOLHDL, LDLDIRECT in the last 72 hours. Thyroid Function Tests: No results for input(s): TSH, T4TOTAL, FREET4, T3FREE, THYROIDAB in the last 72 hours. Anemia Panel: No results for input(s): VITAMINB12, FOLATE, FERRITIN, TIBC, IRON, RETICCTPCT in the last 72 hours. Sepsis Labs: Recent Labs  Lab 10/23/19 1550  PROCALCITON 2.48    Recent Results (from the past 240 hour(s))  Respiratory Panel by RT PCR (Flu A&B, Covid) - Nasopharyngeal Swab     Status: None   Collection Time: 10/20/19 10:37 PM   Specimen: Nasopharyngeal Swab  Result Value Ref Range Status   SARS Coronavirus 2 by RT PCR NEGATIVE NEGATIVE Final    Comment: (NOTE) SARS-CoV-2 target nucleic  acids are NOT DETECTED. The SARS-CoV-2 RNA is generally detectable in upper respiratoy specimens during the acute phase of infection. The lowest concentration of SARS-CoV-2 viral copies this assay can detect is 131 copies/mL. A negative result does not preclude SARS-Cov-2 infection and should not be used as the sole basis for treatment or other patient management decisions. A negative result may occur with  improper specimen collection/handling, submission of specimen other than nasopharyngeal swab, presence of viral mutation(s) within the areas targeted by this assay, and inadequate number of viral copies (<131 copies/mL). A negative result must be combined with clinical observations, patient history, and epidemiological information. The expected result is Negative. Fact Sheet for Patients:  PinkCheek.be Fact Sheet for Healthcare Providers:  GravelBags.it This test is not yet ap proved or cleared by the [place] FDA and  has been authorized for detection and/or diagnosis of SARS-CoV-2 by FDA under an Emergency Use Authorization (EUA). This EUA will remain  in effect (meaning this test can be used) for the duration of the COVID-19 declaration under Section 564(b)(1) of the Act, 21 U.S.C. section 360bbb-3(b)(1), unless the authorization is terminated or revoked sooner.    Influenza A by PCR NEGATIVE NEGATIVE Final   Influenza B by PCR NEGATIVE NEGATIVE Final    Comment: (NOTE) The Xpert Xpress SARS-CoV-2/FLU/RSV assay is intended as an aid in  the diagnosis of influenza from Nasopharyngeal swab specimens and  should not be used as a sole basis for treatment. Nasal washings and  aspirates are unacceptable for Xpert Xpress SARS-CoV-2/FLU/RSV  testing. Fact Sheet for Patients: PinkCheek.be Fact Sheet for Healthcare Providers: GravelBags.it This test is not yet  approved or cleared by the [place] FDA and  has  been authorized for detection and/or diagnosis of SARS-CoV-2 by  FDA under an Emergency Use Authorization (EUA). This EUA will remain  in effect (meaning this test can be used) for the duration of the  Covid-19 declaration under Section 564(b)(1) of the Act, 21  U.S.C. section 360bbb-3(b)(1), unless the authorization is  terminated or revoked. Performed at [facility], [address], [place], [place] [address]   Culture, blood (routine x 2)     Status: None   Collection Time: 10/21/19  9:38 PM   Specimen: BLOOD  Result Value Ref Range Status   Specimen Description BLOOD LEFT HAND  Final   Special Requests   Final    BOTTLES DRAWN AEROBIC AND ANAEROBIC Blood Culture adequate volume   Culture   Final    NO GROWTH 5 DAYS Performed at [facility], [address], [place], [place] [address]    Report Status 10/26/2019 FINAL  Final  Culture, blood (routine x 2)     Status: None   Collection Time: 10/21/19  9:40 PM   Specimen: BLOOD  Result Value Ref Range Status   Specimen Description BLOOD RIGHT HAND  Final   Special Requests   Final    BOTTLES DRAWN AEROBIC AND ANAEROBIC Blood Culture adequate volume   Culture   Final    NO GROWTH 5 DAYS Performed at [facility], [address], [place], [place] [address]    Report Status 10/26/2019 FINAL  Final  MRSA PCR Screening     Status: None   Collection Time: 10/22/19  5:31 AM   Specimen: Nasopharyngeal  Result Value Ref Range Status   MRSA by PCR NEGATIVE NEGATIVE Final    Comment:        The GeneXpert MRSA Assay (FDA approved for NASAL specimens only), is one component of a comprehensive MRSA colonization surveillance program. It is not intended to diagnose MRSA infection nor to guide or monitor treatment for MRSA infections. Performed at [facility], [address], [place], [place] [address]          Radiology Studies: No results  found.      Scheduled Meds: . chlorhexidine  15 mL Mouth Rinse BID  . Chlorhexidine Gluconate Cloth  6 each Topical Q0600  . diltiazem  60 mg Oral Q6H  . hydrALAZINE  75 mg Oral Q6H  . insulin aspart  0-9 Units Subcutaneous Q6H  . mouth rinse  15 mL Mouth Rinse q12n4p  . metoprolol succinate  100 mg Oral Daily  . mometasone-formoterol  2 puff Inhalation BID  . multivitamin with minerals  1 tablet Oral Daily  . pantoprazole  40 mg Oral Daily  . polyethylene glycol  17 g Oral Daily  . potassium chloride  20 mEq Oral TID  . Ensure Max Protein  11 oz Oral Daily  . senna-docusate  1 tablet Oral BID  . sertraline  50 mg Oral Daily  . sodium chloride flush  10-40 mL Intracatheter Q12H   Continuous Infusions: . cefTRIAXone (ROCEPHIN)  IV 1 g (10/28/19 2139)  . heparin 2,300 Units/hr (10/29/19 0358)     LOS: 9 days    Briant Cedar, MD [facility]  If 7PM-7AM, please contact night-coverage [email or web address]  10/29/2019, 1:28 PM

## 2019-10-29 NOTE — Progress Notes (Signed)
Patient has not been wearing CPAP due to ileus.

## 2019-10-29 NOTE — Progress Notes (Signed)
ANTICOAGULATION CONSULT NOTE  Pharmacy Consult for heparin Indication: Afib  Allergies  Allergen Reactions  . Benazepril Anaphylaxis  . Betadine [Povidone Iodine]     rash  . Prednisone Other (See Comments)    Headache.    Patient Measurements: Height: 6' (182.9 cm) Weight: (!) 181.4 kg (400 lb) IBW/kg (Calculated) : 77.6 Heparin Dosing Weight: 123kg  Vital Signs: Temp: 98.6 F (37 C) (05/15 0438) Temp Source: Oral (05/15 0438) BP: 139/75 (05/15 0438) Pulse Rate: 85 (05/15 0438)  Labs: Recent Labs    10/26/19 1405 10/26/19 1405 10/27/19 0328 10/27/19 0328 10/27/19 0625 10/28/19 0428 10/29/19 0341  HGB  --   --  10.1*   < >  --  10.7* 10.5*  HCT  --   --  33.6*  --   --  34.3* 33.7*  PLT  --   --  311  --   --  322 308  APTT 91*  --  97*  --   --   --   --   HEPARINUNFRC 0.32   < >  --   --  0.27* 0.26* 0.35  CREATININE  --   --  1.68*  --   --  0.96  1.02 0.96   < > = values in this interval not displayed.    Estimated Creatinine Clearance: 155.1 mL/min (by C-G formula based on SCr of 0.96 mg/dL).  Assessment: 49 yo male with h/o AFib, Eliquis on hold, on heparin.    Heparin level therapeutic this AM at 0.35 after rate increase to 2300 units/hr. Hgb stable at 10.5. Plt wnl. No bleeding or infusion issues reported per RN.  Goal of Therapy:  Heparin level 0.3-0.7 units/ml Monitor platelets by anticoagulation protocol: Yes   Plan:  Continue heparin infusion at 2300 units/hr Monitor daily HL, CBC, and for s/sx of bleeding  F/u ortho plan regarding knee injury  Lulu Riding, PharmD PGY1 Pharmacy Resident  Please check AMION for all [facility] Pharmacy phone numbers After 10:00 PM, call Main Pharmacy [phone]

## 2019-10-30 LAB — RENAL FUNCTION PANEL
Albumin: 2.3 g/dL — ABNORMAL LOW (ref 3.5–5.0)
Anion gap: 11 (ref 5–15)
BUN: 20 mg/dL (ref 6–20)
CO2: 23 mmol/L (ref 22–32)
Calcium: 9 mg/dL (ref 8.9–10.3)
Chloride: 109 mmol/L (ref 98–111)
Creatinine, Ser: 0.84 mg/dL (ref 0.61–1.24)
GFR calc Af Amer: >60 mL/min (ref >60)
GFR calc non Af Amer: >60 mL/min (ref >60)
Glucose, Bld: 111 mg/dL — ABNORMAL HIGH (ref 70–99)
Phosphorus: 3.8 mg/dL (ref 2.5–4.6)
Potassium: 3.3 mmol/L — ABNORMAL LOW (ref 3.5–5.1)
Sodium: 143 mmol/L (ref 135–145)

## 2019-10-30 LAB — CBC
HCT: 33.4 % — ABNORMAL LOW (ref 39.0–52.0)
Hemoglobin: 10.2 g/dL — ABNORMAL LOW (ref 13.0–17.0)
MCH: 27.6 pg (ref 26.0–34.0)
MCHC: 30.5 g/dL (ref 30.0–36.0)
MCV: 90.5 fL (ref 80.0–100.0)
Platelets: 290 10*3/uL (ref 150–400)
RBC: 3.69 MIL/uL — ABNORMAL LOW (ref 4.22–5.81)
RDW: 14.8 % (ref 11.5–15.5)
WBC: 12.1 10*3/uL — ABNORMAL HIGH (ref 4.0–10.5)
nRBC: 0.2 % (ref 0.0–0.2)

## 2019-10-30 LAB — GLUCOSE, CAPILLARY
Glucose-Capillary: 101 mg/dL — ABNORMAL HIGH (ref 70–99)
Glucose-Capillary: 103 mg/dL — ABNORMAL HIGH (ref 70–99)
Glucose-Capillary: 116 mg/dL — ABNORMAL HIGH (ref 70–99)
Glucose-Capillary: 90 mg/dL (ref 70–99)
Glucose-Capillary: 103 mg/dL — ABNORMAL HIGH (ref 70–99)

## 2019-10-30 LAB — HEPARIN LEVEL (UNFRACTIONATED): Heparin Unfractionated: 0.52 IU/mL (ref 0.30–0.70)

## 2019-10-30 LAB — MAGNESIUM: Magnesium: 1.3 mg/dL — ABNORMAL LOW (ref 1.7–2.4)

## 2019-10-30 MED ORDER — MAGNESIUM SULFATE 2 GM/50ML IV SOLN
2.0000 g | Freq: Once | INTRAVENOUS | Status: AC
  Administered 2019-10-30: 2 g via INTRAVENOUS
  Filled 2019-10-30: qty 50

## 2019-10-30 MED ORDER — POTASSIUM CHLORIDE CRYS ER 20 MEQ PO TBCR
40.0000 meq | EXTENDED_RELEASE_TABLET | Freq: Once | ORAL | Status: AC
  Administered 2019-10-30: 40 meq via ORAL
  Filled 2019-10-30: qty 2

## 2019-10-30 MED ORDER — SENNOSIDES-DOCUSATE SODIUM 8.6-50 MG PO TABS: 1.0000 | ORAL_TABLET | Freq: Every evening | ORAL | Status: DC | PRN

## 2019-10-30 NOTE — Progress Notes (Signed)
PROGRESS NOTE    Douglas [name]  TWS:[id number] DOB: 1971-05-06 DOA: 10/20/2019 PCP: Toma Deiters, MD   Brief Narrative:  49 year old gentleman history of diabetes hypertension came to the hospital after mechanical fall which occurred several weeks ago.  Patient was found to have acute renal failure.  And concern for SBO versus ileus.  NG tube was placed. Patient was seen by surgery for ileus.  Recommending continued NG tube decompression. Patient had worsening kidney function, nephrology was consulted for evaluation.  Due to low blood pressures and very little urine output PCCM was consulted for recommendations transferred to the intensive care unit for close observation and likely initiation of CVVHD.  Patient does have history of paroxysmal atrial fibrillation.  Was on Eliquis PTA.  Switched to heparin infusion for now pending possible surgical intervention.  Assessment & Plan:   Active Problems:   Diabetes mellitus without complication (HCC)   CHF (congestive heart failure) (HCC)   Gout   Hypertension   Sleep apnea   Atrial fibrillation (HCC)   Rupture of left quadriceps tendon   Rupture of patellar tendon, right, initial encounter   AKI (acute kidney injury) (HCC)   Knee pain   AKI presumably due to ischemic ATN/hypotension/dehydration, POA - Continue to hold ARB and Lasix; NSAIDs discontinued at admission - CRRT 10/22/2019 through 10/26/2019, improving drastically - Urine output continues to improve drastically, creatinine downtrending appropriately now WNL Lab Results  Component Value Date   CREATININE 0.84 10/30/2019   CREATININE 0.96 10/29/2019   CREATININE 1.02 10/28/2019   CREATININE 0.96 10/28/2019    Shock multifactorial in the setting of AKI, hypotension, dehydration, ileus,  Questionably concurrent UTI - Currently afebrile, with persistent leukocytosis - Shock criteria resolved with supportive care, IV fluids - Completed ceftriaxone for 7 days, last dose  on 10/29/2019  Ileus, resolved  - Likely related to renal failure; improved - Continue to advance diet as tolerated  Hypokalemia/hypomagnesemia -Replace as needed  Paroxysmal atrial fibrillation, improved -Continues in sinus rhythm currently - Continue IV heparin for coverage -CHA2DS2-VASc of 3 -Continue diltiazem, metoprolol  History of chronic systolic heart failure, EF 50 to 55% - Repeat echo poor window given body habitus -Continue metoprolol -Continue to hold home Lasix, losartan for now  Hypertension, essential  - Hydralazine increased, diltiazem, metoprolol - Would be good candidate for entresto as OP  Type 2 diabetes mellitus -Last A1c 6.8 on 10/21/2019 -Continue sliding scale insulin, hypoglycemic protocol, Accu-Cheks  Knee injury with left high-grade quadriceps tear and medial retinacular tear, possible lateral meniscus tear right complete patella tendon rupture -Orthopedics previously following, spoke with on-call PA, will have orthopedics reevaluate patient for possible inpatient procedure versus outpatient follow-up per their expertise -Discussed with Dr. Lajoyce Corners on 10/30/2019 who would let Dr. Roda Shutters about possible reevaluation for inpatient procedure  Morbid obesity -Lifestyle modification advised     DVT prophylaxis: Heparin drip Code Status: Full Family Communication: Discussed with patient  Status is: Inpatient  Dispo: The patient is from: Home              Anticipated d/c is to: Home Vs CIR pending surgical and PT evaluation              Anticipated d/c date is: To be determined, likely 48 to 72 hours pending further evaluation with surgery and PT              Patient currently not medically stable for discharge, continues to require IV anticoagulation, close monitoring in  the setting of kidney failure, ambulatory dysfunction  Consultants:   PCCM, nephrology, orthopedics  Procedures:   CRRT 5/8 -10/26/19  Antimicrobials:  Ceftriaxone, stopped on  10/29/2019  Subjective: Patient denies any new complaints, denies any chest pain, shortness of breath, abdominal pain, nausea/vomiting, fever/chills.    Objective: Vitals:   10/30/19 0750 10/30/19 0752 10/30/19 1527 10/30/19 1530  BP:  136/79 130/72 (!) 148/92  Pulse: 89 88 65 87  Resp: 18 17 17 17   Temp:  98.4 F (36.9 C) 97.9 F (36.6 C) (!) 97.5 F (36.4 C)  TempSrc:  Oral Oral Oral  SpO2: 100% 100% 99% 95%  Weight:      Height:        Intake/Output Summary (Last 24 hours) at 10/30/2019 1631 Last data filed at 10/30/2019 0900 Gross per 24 hour  Intake 710 ml  Output --  Net 710 ml   Filed Weights   10/24/19 0406 10/25/19 0440 10/27/19 0330  Weight: (!) 181 kg (!) 186 kg (!) 181.4 kg    Examination:  General: NAD, morbidly obese  Cardiovascular: S1, S2 present  Respiratory: CTAB  Abdomen: Soft, nontender, nondistended, bowel sounds present  Musculoskeletal: No bilateral pedal edema noted  Skin: Normal  Psychiatry: Normal mood   Data Reviewed: I have personally reviewed following labs and imaging studies  CBC: Recent Labs  Lab 10/25/19 0354 10/25/19 0354 10/26/19 0415 10/27/19 0328 10/28/19 0428 10/29/19 0341 10/30/19 0418  WBC 14.2*   < > 16.4* 15.9* 13.3* 13.3* 12.1*  NEUTROABS 8.6*  --   --   --   --   --   --   HGB 11.5*   < > 10.2* 10.1* 10.7* 10.5* 10.2*  HCT 39.0   < > 34.0* 33.6* 34.3* 33.7* 33.4*  MCV 94.2   < > 94.4 92.1 89.1 89.2 90.5  PLT 463*   < > 317 311 322 308 290   < > = values in this interval not displayed.   Basic Metabolic Panel: Recent Labs  Lab 10/26/19 0415 10/27/19 0328 10/28/19 0428 10/29/19 0341 10/30/19 0418  NA 140 141 142  142 141 143  K 3.6 3.3* 3.2*  3.3* 3.4* 3.3*  CL 105 107 108  108 109 109  CO2 24 22 24  23 25 23   GLUCOSE 88 84 105*  106* 108* 111*  BUN 24* 32* 26*  25* 24* 20  CREATININE 2.62* 1.68* 0.96  1.02 0.96 0.84  CALCIUM 8.7* 9.2 9.4  9.4 9.0 9.0  MG 2.4 1.9 1.3* 1.2* 1.3*   PHOS 2.7 3.4 3.2  3.3 3.7 3.8   GFR: Estimated Creatinine Clearance: 177.2 mL/min (by C-G formula based on SCr of 0.84 mg/dL). Liver Function Tests: Recent Labs  Lab 10/25/19 0354 10/25/19 1515 10/26/19 0415 10/27/19 0328 10/28/19 0428 10/29/19 0341 10/30/19 0418  AST 26  --   --   --   --   --   --   ALT 31  --   --   --   --   --   --   ALKPHOS 59  --   --   --   --   --   --   BILITOT 1.2  --   --   --   --   --   --   PROT 7.3  --   --   --   --   --   --   ALBUMIN 2.4*   < > 1.9* 2.0*  2.1*  2.1* 2.2* 2.3*   < > = values in this interval not displayed.   No results for input(s): LIPASE, AMYLASE in the last 168 hours. No results for input(s): AMMONIA in the last 168 hours. Coagulation Profile: No results for input(s): INR, PROTIME in the last 168 hours. Cardiac Enzymes: No results for input(s): CKTOTAL, CKMB, CKMBINDEX, TROPONINI in the last 168 hours. BNP (last 3 results) No results for input(s): PROBNP in the last 8760 hours. HbA1C: No results for input(s): HGBA1C in the last 72 hours. CBG: Recent Labs  Lab 10/29/19 1629 10/29/19 2046 10/30/19 0534 10/30/19 0754 10/30/19 1149  GLUCAP 136* 102* 101* 103* 116*   Lipid Profile: No results for input(s): CHOL, HDL, LDLCALC, TRIG, CHOLHDL, LDLDIRECT in the last 72 hours. Thyroid Function Tests: No results for input(s): TSH, T4TOTAL, FREET4, T3FREE, THYROIDAB in the last 72 hours. Anemia Panel: No results for input(s): VITAMINB12, FOLATE, FERRITIN, TIBC, IRON, RETICCTPCT in the last 72 hours. Sepsis Labs: No results for input(s): PROCALCITON, LATICACIDVEN in the last 168 hours.  Recent Results (from the past 240 hour(s))  Respiratory Panel by RT PCR (Flu A&B, Covid) - Nasopharyngeal Swab     Status: None   Collection Time: 10/20/19 10:37 PM   Specimen: Nasopharyngeal Swab  Result Value Ref Range Status   SARS Coronavirus 2 by RT PCR NEGATIVE NEGATIVE Final    Comment: (NOTE) SARS-CoV-2 target nucleic acids  are NOT DETECTED. The SARS-CoV-2 RNA is generally detectable in upper respiratoy specimens during the acute phase of infection. The lowest concentration of SARS-CoV-2 viral copies this assay can detect is 131 copies/mL. A negative result does not preclude SARS-Cov-2 infection and should not be used as the sole basis for treatment or other patient management decisions. A negative result may occur with  improper specimen collection/handling, submission of specimen other than nasopharyngeal swab, presence of viral mutation(s) within the areas targeted by this assay, and inadequate number of viral copies (<131 copies/mL). A negative result must be combined with clinical observations, patient history, and epidemiological information. The expected result is Negative. Fact Sheet for Patients:  https://www.moore.com/ Fact Sheet for Healthcare Providers:  https://www.young.biz/ This test is not yet ap proved or cleared by the [place] FDA and  has been authorized for detection and/or diagnosis of SARS-CoV-2 by FDA under an Emergency Use Authorization (EUA). This EUA will remain  in effect (meaning this test can be used) for the duration of the COVID-19 declaration under Section 564(b)(1) of the Act, 21 U.S.C. section 360bbb-3(b)(1), unless the authorization is terminated or revoked sooner.    Influenza A by PCR NEGATIVE NEGATIVE Final   Influenza B by PCR NEGATIVE NEGATIVE Final    Comment: (NOTE) The Xpert Xpress SARS-CoV-2/FLU/RSV assay is intended as an aid in  the diagnosis of influenza from Nasopharyngeal swab specimens and  should not be used as a sole basis for treatment. Nasal washings and  aspirates are unacceptable for Xpert Xpress SARS-CoV-2/FLU/RSV  testing. Fact Sheet for Patients: https://www.moore.com/ Fact Sheet for Healthcare Providers: https://www.young.biz/ This test is not yet approved or  cleared by the [place] FDA and  has been authorized for detection and/or diagnosis of SARS-CoV-2 by  FDA under an Emergency Use Authorization (EUA). This EUA will remain  in effect (meaning this test can be used) for the duration of the  Covid-19 declaration under Section 564(b)(1) of the Act, 21  U.S.C. section 360bbb-3(b)(1), unless the authorization is  terminated or revoked. Performed at [facility]  Lab, [address], [place], [place] [address]   Culture, blood (routine x 2)     Status: None   Collection Time: 10/21/19  9:38 PM   Specimen: BLOOD  Result Value Ref Range Status   Specimen Description BLOOD LEFT HAND  Final   Special Requests   Final    BOTTLES DRAWN AEROBIC AND ANAEROBIC Blood Culture adequate volume   Culture   Final    NO GROWTH 5 DAYS Performed at [facility], [address] [place], [place] [address]    Report Status 10/26/2019 FINAL  Final  Culture, blood (routine x 2)     Status: None   Collection Time: 10/21/19  9:40 PM   Specimen: BLOOD  Result Value Ref Range Status   Specimen Description BLOOD RIGHT HAND  Final   Special Requests   Final    BOTTLES DRAWN AEROBIC AND ANAEROBIC Blood Culture adequate volume   Culture   Final    NO GROWTH 5 DAYS Performed at [facility], [address] [place], [place] [address]    Report Status 10/26/2019 FINAL  Final  MRSA PCR Screening     Status: None   Collection Time: 10/22/19  5:31 AM   Specimen: Nasopharyngeal  Result Value Ref Range Status   MRSA by PCR NEGATIVE NEGATIVE Final    Comment:        The GeneXpert MRSA Assay (FDA approved for NASAL specimens only), is one component of a comprehensive MRSA colonization surveillance program. It is not intended to diagnose MRSA infection nor to guide or monitor treatment for MRSA infections. Performed at [facility] Lab, [address] [place], [place] [address]          Radiology Studies: No results  found.      Scheduled Meds: . chlorhexidine  15 mL Mouth Rinse BID  . Chlorhexidine Gluconate Cloth  6 each Topical Q0600  . diltiazem  60 mg Oral Q6H  . hydrALAZINE  75 mg Oral Q6H  . insulin aspart  0-9 Units Subcutaneous Q6H  . mouth rinse  15 mL Mouth Rinse q12n4p  . metoprolol succinate  100 mg Oral Daily  . mometasone-formoterol  2 puff Inhalation BID  . multivitamin with minerals  1 tablet Oral Daily  . pantoprazole  40 mg Oral Daily  . polyethylene glycol  17 g Oral Daily  . potassium chloride  20 mEq Oral TID  . Ensure Max Protein  11 oz Oral Daily  . sertraline  50 mg Oral Daily  . sodium chloride flush  10-40 mL Intracatheter Q12H   Continuous Infusions: . heparin 2,300 Units/hr (10/30/19 1619)     LOS: 10 days    Briant Cedar, MD [facility]  If 7PM-7AM, please contact night-coverage [email or web address]  10/30/2019, 4:31 PM

## 2019-10-30 NOTE — Progress Notes (Signed)
Inpatient Rehab Admissions Coordinator:   Met with patient at bedside to discuss potential CIR admission. Pt. Stated interest. Will pursue for potential admit next week, pending bed availability. Left voicemail for patient's wife to discuss support at time of d/c.   Clemens Catholic, [place], [place] Admissions Coordinator  [phone] ([place]) [phone] (office)

## 2019-10-30 NOTE — Plan of Care (Signed)

## 2019-10-30 NOTE — Progress Notes (Signed)
Inpatient Rehab Admissions Coordinator Note:   Per therapy recommendations, pt was screened for CIR candidacy by Robben Jagiello, MS CCC-SLP. At this time, Pt. Appears to have functional decline and is a good candidate for CIR. Will place order for rehab consult per protocol.  Please contact me with questions.   Kajuan Guyton, MS, CCC-SLP Rehab Admissions Coordinator  [phone] (celll) [phone] (office)  

## 2019-10-30 NOTE — Progress Notes (Signed)
Pt states he was told to hold off on CPAP and has just been wearing oxygen at night due to "air in his stomach". CPAP held for tonight at patients request.

## 2019-10-30 NOTE — Progress Notes (Signed)
ANTICOAGULATION CONSULT NOTE  Pharmacy Consult for heparin Indication: Afib  Allergies  Allergen Reactions  . Benazepril Anaphylaxis  . Betadine [Povidone Iodine]     rash  . Prednisone Other (See Comments)    Headache.    Patient Measurements: Height: 6' (182.9 cm) Weight: (!) 181.4 kg (400 lb) IBW/kg (Calculated) : 77.6 Heparin Dosing Weight: 123kg  Vital Signs: Temp: 98 F (36.7 C) (05/16 0402) Temp Source: Oral (05/16 0402) BP: 134/82 (05/16 0402) Pulse Rate: 77 (05/16 0402)  Labs: Recent Labs    10/28/19 0428 10/28/19 0428 10/29/19 0341 10/30/19 0418  HGB 10.7*   < > 10.5* 10.2*  HCT 34.3*  --  33.7* 33.4*  PLT 322  --  308 290  HEPARINUNFRC 0.26*  --  0.35 0.52  CREATININE 0.96  1.02  --  0.96 0.84   < > = values in this interval not displayed.    Estimated Creatinine Clearance: 177.2 mL/min (by C-G formula based on SCr of 0.84 mg/dL).  Assessment: 49 yo male with h/o AFib, Eliquis on hold, on heparin.    Heparin level therapeutic this AM at 0.52 at rate of 2300 units/hr. Hgb stable at 10.2. Plt wnl. No bleeding or infusion issues reported per RN.  Goal of Therapy:  Heparin level 0.3-0.7 units/ml Monitor platelets by anticoagulation protocol: Yes   Plan:  Continue heparin infusion at 2300 units/hr Monitor daily HL, CBC, and for s/sx of bleeding  F/u ortho plan regarding knee injury  Lulu Riding, PharmD PGY1 Pharmacy Resident  Please check AMION for all [facility] Pharmacy phone numbers After 10:00 PM, call Main Pharmacy [phone]

## 2019-10-31 LAB — CBC
HCT: 32.7 % — ABNORMAL LOW (ref 39.0–52.0)
Hemoglobin: 10 g/dL — ABNORMAL LOW (ref 13.0–17.0)
MCH: 28.1 pg (ref 26.0–34.0)
MCHC: 30.6 g/dL (ref 30.0–36.0)
MCV: 91.9 fL (ref 80.0–100.0)
Platelets: 287 10*3/uL (ref 150–400)
RBC: 3.56 MIL/uL — ABNORMAL LOW (ref 4.22–5.81)
RDW: 15.1 % (ref 11.5–15.5)
WBC: 10.9 10*3/uL — ABNORMAL HIGH (ref 4.0–10.5)
nRBC: 0 % (ref 0.0–0.2)

## 2019-10-31 LAB — RENAL FUNCTION PANEL
Albumin: 2.4 g/dL — ABNORMAL LOW (ref 3.5–5.0)
Anion gap: 8 (ref 5–15)
BUN: 15 mg/dL (ref 6–20)
CO2: 23 mmol/L (ref 22–32)
Calcium: 8.8 mg/dL — ABNORMAL LOW (ref 8.9–10.3)
Chloride: 112 mmol/L — ABNORMAL HIGH (ref 98–111)
Creatinine, Ser: 0.87 mg/dL (ref 0.61–1.24)
GFR calc Af Amer: >60 mL/min (ref >60)
GFR calc non Af Amer: >60 mL/min (ref >60)
Glucose, Bld: 111 mg/dL — ABNORMAL HIGH (ref 70–99)
Phosphorus: 3.6 mg/dL (ref 2.5–4.6)
Potassium: 3.3 mmol/L — ABNORMAL LOW (ref 3.5–5.1)
Sodium: 143 mmol/L (ref 135–145)

## 2019-10-31 LAB — GLUCOSE, CAPILLARY
Glucose-Capillary: 102 mg/dL — ABNORMAL HIGH (ref 70–99)
Glucose-Capillary: 101 mg/dL — ABNORMAL HIGH (ref 70–99)
Glucose-Capillary: 95 mg/dL (ref 70–99)
Glucose-Capillary: 106 mg/dL — ABNORMAL HIGH (ref 70–99)
Glucose-Capillary: 99 mg/dL (ref 70–99)

## 2019-10-31 LAB — MAGNESIUM: Magnesium: 1.4 mg/dL — ABNORMAL LOW (ref 1.7–2.4)

## 2019-10-31 LAB — HEPARIN LEVEL (UNFRACTIONATED): Heparin Unfractionated: 0.63 IU/mL (ref 0.30–0.70)

## 2019-10-31 MED ORDER — POTASSIUM CHLORIDE CRYS ER 20 MEQ PO TBCR
40.0000 meq | EXTENDED_RELEASE_TABLET | Freq: Once | ORAL | Status: AC
  Administered 2019-10-31: 20 meq via ORAL
  Filled 2019-10-31: qty 2

## 2019-10-31 MED ORDER — POLYETHYLENE GLYCOL 3350 17 G PO PACK
17.0000 g | PACK | Freq: Every day | ORAL | Status: DC | PRN
  Administered 2019-11-03: 17 g via ORAL
  Filled 2019-10-31: qty 1

## 2019-10-31 MED ORDER — MAGNESIUM SULFATE 4 GM/100ML IV SOLN
4.0000 g | Freq: Once | INTRAVENOUS | Status: AC
  Administered 2019-10-31: 4 g via INTRAVENOUS
  Filled 2019-10-31: qty 100

## 2019-10-31 NOTE — Evaluation (Signed)
Occupational Therapy Evaluation Patient Details Name: Douglas [name] MRN: [id number] DOB: 10/18/1970 Today's Date: 10/31/2019    History of Present Illness 49 yo male who has not walked in four weeks was admitted, noted his pain in knees from a recent fall that led to nonambulatory status.  Pt is now in acute renal failure, had low BP, shock,  and AKI with hypotension, acute renal failure.  Per MRI has L quad tear, medial retinacular tear, R patellar tendon rupture, lateral meniscal tear. PMHx:  CHF, EF 50-55%, PAF, ileus, HTN,    Clinical Impression   PTA, pt was living at home with his wife, pt reports he was independent with ADL/IADL and functional mobility. Pt has two grandchildren who per pt, keep him very busy. Pt currently limited to bed level due to BLE injuries. Pt completed grooming while seated EOB, pt demonstrated increased DoE 3/4 with completion of activity. Began education on energy conservation strategies and use of AE to assist with LB dressing. Due to decline in current level of function, pt would benefit from acute OT to address established goals to facilitate safe D/C to venue listed below. At this time, recommend CIR follow-up. Pt with possible surgery, will continue to follow and adjust plan of care appropriately.     Follow Up Recommendations  CIR    Equipment Recommendations  Other (comment)(will continue to assess)    Recommendations for Other Services       Precautions / Restrictions Precautions Precautions: Fall Restrictions Weight Bearing Restrictions: No Other Position/Activity Restrictions: kept pt NWB through BLE       Mobility Bed Mobility Overal bed mobility: Needs Assistance Bed Mobility: Supine to Sit;Sit to Supine     Supine to sit: Min assist Sit to supine: Min assist   General bed mobility comments: minA for LLE management  Transfers                 General transfer comment: deferred    Balance Overall balance  assessment: Needs assistance;History of Falls Sitting-balance support: Feet supported;Single extremity supported Sitting balance-Leahy Scale: Fair Sitting balance - Comments: reliant on at least single UE support while sitting EOB, pt on air bed                                   ADL either performed or assessed with clinical judgement   ADL Overall ADL's : Needs assistance/impaired Eating/Feeding: Set up;Sitting   Grooming: Set up;Sitting Grooming Details (indicate cue type and reason): completed sitting EOB Upper Body Bathing: Set up;Sitting   Lower Body Bathing: Moderate assistance;Sitting/lateral leans   Upper Body Dressing : Minimal assistance;Sitting   Lower Body Dressing: Moderate assistance;Sitting/lateral leans     Toilet Transfer Details (indicate cue type and reason): deferred           General ADL Comments: pt tolerated sitting EOB for grooming, increased doe 3/4 with completion of grooming task;began education on energy conservation strategies and use of AE to assist with LB dressing     Vision Baseline Vision/History: Wears glasses Wears Glasses: At all times Patient Visual Report: No change from baseline       Perception     Praxis      Pertinent Vitals/Pain Pain Assessment: No/denies pain Pain Intervention(s): Monitored during session     Hand Dominance Right   Extremity/Trunk Assessment Upper Extremity Assessment Upper Extremity Assessment: Overall WFL for tasks assessed  Lower Extremity Assessment Lower Extremity Assessment: Defer to PT evaluation   Cervical / Trunk Assessment Cervical / Trunk Assessment: Normal   Communication Communication Communication: No difficulties   Cognition Arousal/Alertness: Awake/[name] Behavior During Therapy: WFL for tasks assessed/performed Overall Cognitive Status: Within Functional Limits for tasks assessed                                 General Comments: pt is highly  motivated to work with therapy   General Comments       Exercises     Shoulder Instructions      Home Living Family/patient expects to be discharged to:: Private residence Living Arrangements: Spouse/significant other Available Help at Discharge: Family;Available PRN/intermittently Type of Home: House Home Access: Stairs to enter [employer] of Steps: 4(4) Entrance Stairs-Rails: Can reach both Home Layout: One level     Bathroom Shower/Tub: Producer, television/film/video: Standard Bathroom Accessibility: Yes How Accessible: Accessible via wheelchair;Accessible via walker Home Equipment: None          Prior Functioning/Environment Level of Independence: Independent                 OT Problem List: Impaired balance (sitting and/or standing);Decreased activity tolerance;Impaired UE functional use      OT Treatment/Interventions: Self-care/ADL training;Therapeutic exercise;Energy conservation;DME and/or AE instruction;Therapeutic activities;Patient/family education;Balance training    OT Goals(Current goals can be found in the care plan section) Acute Rehab OT Goals Patient Stated Goal: to get home and able to walk OT Goal Formulation: With patient Time For Goal Achievement: 11/14/19 Potential to Achieve Goals: Good ADL Goals Pt Will Perform Grooming: standing;with modified independence;sitting Pt Will Perform Lower Body Dressing: with modified independence;sit to/from stand Pt Will Transfer to Toilet: with modified independence;ambulating  OT Frequency: Min 2X/week   Barriers to D/C:            Co-evaluation              AM-PAC OT "6 Clicks" Daily Activity     Outcome Measure Help from another person eating meals?: A Little Help from another person taking care of personal grooming?: A Little Help from another person toileting, which includes using toliet, bedpan, or urinal?: A Lot Help from another person bathing (including washing,  rinsing, drying)?: A Lot Help from another person to put on and taking off regular upper body clothing?: A Little Help from another person to put on and taking off regular lower body clothing?: A Lot 6 Click Score: 15   End of Session Nurse Communication: Mobility status  Activity Tolerance: Patient tolerated treatment well Patient left: in bed;with call bell/phone within reach  OT Visit Diagnosis: Other abnormalities of gait and mobility (R26.89);History of falling (Z91.81);Muscle weakness (generalized) (M62.81)                Time: 2458-0998 OT Time Calculation (min): 22 min Charges:  OT General Charges $OT Visit: 1 Visit OT Evaluation $OT Eval Moderate Complexity: 1 Mod  Douglas [name] Acute Rehabilitation Services Office: [phone]   Douglas [name] 10/31/2019, 1:06 PM

## 2019-10-31 NOTE — Progress Notes (Signed)
I have spoken with Dr. August Saucer about patient's case and he will be seeing the patient to discuss treatment plan.

## 2019-10-31 NOTE — Plan of Care (Signed)
  Problem: Health Behavior/Discharge Planning: Goal: Ability to manage health-related needs will improve Outcome: Progressing   Problem: Elimination: Goal: Will not experience complications related to bowel motility Outcome: Not Progressing Goal: Will not experience complications related to urinary retention Outcome: Progressing

## 2019-10-31 NOTE — Progress Notes (Signed)
ANTICOAGULATION CONSULT NOTE  Pharmacy Consult for heparin Indication: Afib  Allergies  Allergen Reactions  . Benazepril Anaphylaxis  . Betadine [Povidone Iodine]     rash  . Prednisone Other (See Comments)    Headache.    Patient Measurements: Height: 6' (182.9 cm) Weight: (!) 181.4 kg (400 lb) IBW/kg (Calculated) : 77.6 Heparin Dosing Weight: 123kg  Vital Signs: Temp: 98.2 F (36.8 C) (05/17 0403) Temp Source: Oral (05/17 0403) BP: 131/81 (05/17 0403) Pulse Rate: 87 (05/17 0403)  Labs: Recent Labs    10/29/19 0341 10/29/19 0341 10/30/19 0418 10/31/19 0443 10/31/19 0444  HGB 10.5*   < > 10.2* 10.0*  --   HCT 33.7*  --  33.4* 32.7*  --   PLT 308  --  290 287  --   HEPARINUNFRC 0.35  --  0.52  --  0.63  CREATININE 0.96  --  0.84  --  0.87   < > = values in this interval not displayed.    Estimated Creatinine Clearance: 171.1 mL/min (by C-G formula based on SCr of 0.87 mg/dL).  Assessment: 49 yo male with h/o AFib continues on heparin. He was on apixaban PTA which has been on hold.     Heparin level therapeutic this AM at 0.63 at rate of 2300 units/hr. However, this has been trending up the last few days. CBC is stable. No bleeding or infusion issues reported per RN.  Goal of Therapy:  Heparin level 0.3-0.7 units/ml Monitor platelets by anticoagulation protocol: Yes   Plan:  Reduce heparin gtt to 2250 units/hr Daily heparin level and CBC  Lysle Pearl, PharmD, BCPS Clinical Pharmacist Please see AMION for all pharmacy numbers 10/31/2019 7:57 AM

## 2019-10-31 NOTE — Progress Notes (Signed)
PROGRESS NOTE    Douglas [name]  ASN:[id number] DOB: 1971-03-13 DOA: 10/20/2019 PCP: Neale Burly, MD   Brief Narrative:  49 year old gentleman history of diabetes hypertension came to the hospital after mechanical fall which occurred several weeks ago.  Patient was found to have acute renal failure.  And concern for SBO versus ileus.  NG tube was placed. Patient was seen by surgery for ileus.  Recommending continued NG tube decompression. Patient had worsening kidney function, nephrology was consulted for evaluation.  Due to low blood pressures and very little urine output PCCM was consulted for recommendations transferred to the intensive care unit for close observation and likely initiation of CVVHD.  Patient does have history of paroxysmal atrial fibrillation.  Was on Eliquis PTA.  Switched to heparin infusion for now pending possible surgical intervention.  Assessment & Plan:   Active Problems:   Diabetes mellitus without complication (HCC)   CHF (congestive heart failure) (HCC)   Gout   Hypertension   Sleep apnea   Atrial fibrillation (HCC)   Rupture of left quadriceps tendon   Rupture of patellar tendon, right, initial encounter   AKI (acute kidney injury) ([place])   Knee pain   AKI presumably due to ischemic ATN/hypotension/dehydration, POA - Continue to hold ARB and Lasix; NSAIDs discontinued at admission - CRRT 10/22/2019 through 10/26/2019, improving drastically - Urine output continues to improve drastically, creatinine downtrending appropriately now WNL Lab Results  Component Value Date   CREATININE 0.87 10/31/2019   CREATININE 0.84 10/30/2019   CREATININE 0.96 10/29/2019    Shock multifactorial in the setting of AKI, hypotension, dehydration, ileus,  Questionably concurrent UTI - Currently afebrile, with persistent leukocytosis - Shock criteria resolved with supportive care, IV fluids - Completed ceftriaxone for 7 days, last dose on 10/29/2019  Ileus,  resolved  - Likely related to renal failure; improved - Continue to advance diet as tolerated  Hypokalemia/hypomagnesemia -Replace as needed  Paroxysmal atrial fibrillation, improved -Continues in sinus rhythm currently - Continue IV heparin for coverage -CHA2DS2-VASc of 3 -Continue diltiazem, metoprolol  History of chronic systolic heart failure, EF 50 to 55% - Repeat echo poor window given body habitus -Continue metoprolol -Continue to hold home Lasix, losartan for now  Hypertension, essential  - Hydralazine increased, diltiazem, metoprolol - Would be good candidate for entresto as OP  Type 2 diabetes mellitus -Last A1c 6.8 on 10/21/2019 -Continue sliding scale insulin, hypoglycemic protocol, Accu-Cheks  Knee injury with left high-grade quadriceps tear and medial retinacular tear, possible lateral meniscus tear right complete patella tendon rupture -Orthopedics previously following, spoke with on-call PA, will have orthopedics reevaluate patient for possible inpatient procedure versus outpatient follow-up per their expertise -Plan for surgical procedure on 11/01/2019  Morbid obesity -Lifestyle modification advised     DVT prophylaxis: Heparin drip Code Status: Full Family Communication: Discussed with patient  Status is: Inpatient  Dispo: The patient is from: Home              Anticipated d/c is to: Home Vs CIR pending surgical and PT evaluation              Anticipated d/c date is: To be determined, likely 48 to 72 hours pending further evaluation with surgery and PT              Patient currently not medically stable for discharge, continues to require IV anticoagulation, close monitoring in the setting of kidney failure, ambulatory dysfunction pending surgical clearance   Consultants:   PCCM,  nephrology, orthopedics  Procedures:   CRRT 5/8 -10/26/19  Antimicrobials:  Ceftriaxone, stopped on 10/29/2019  Subjective: Patient noted to be having multiple  bowel movements, laxative changed to as needed.  Denies any other new complaints.    Objective: Vitals:   10/30/19 2045 10/31/19 0403 10/31/19 0804 10/31/19 1254  BP:  131/81 128/89 124/73  Pulse:  87 88 95  Resp:  16 15 17   Temp:  98.2 F (36.8 C) 98.7 F (37.1 C) 98.1 F (36.7 C)  TempSrc:  Oral Oral Oral  SpO2: 96% 95% 90% 94%  Weight:      Height:        Intake/Output Summary (Last 24 hours) at 10/31/2019 1412 Last data filed at 10/31/2019 0900 Gross per 24 hour  Intake 240 ml  Output --  Net 240 ml   Filed Weights   10/24/19 0406 10/25/19 0440 10/27/19 0330  Weight: (!) 181 kg (!) 186 kg (!) 181.4 kg    Examination:  General: NAD, morbidly obese  Cardiovascular: S1, S2 present  Respiratory: CTAB  Abdomen: Soft, nontender, nondistended, bowel sounds present  Musculoskeletal: No bilateral pedal edema noted  Skin: Normal  Psychiatry: Normal mood   Data Reviewed: I have personally reviewed following labs and imaging studies  CBC: Recent Labs  Lab 10/25/19 0354 10/26/19 0415 10/27/19 0328 10/28/19 0428 10/29/19 0341 10/30/19 0418 10/31/19 0443  WBC 14.2*   < > 15.9* 13.3* 13.3* 12.1* 10.9*  NEUTROABS 8.6*  --   --   --   --   --   --   HGB 11.5*   < > 10.1* 10.7* 10.5* 10.2* 10.0*  HCT 39.0   < > 33.6* 34.3* 33.7* 33.4* 32.7*  MCV 94.2   < > 92.1 89.1 89.2 90.5 91.9  PLT 463*   < > 311 322 308 290 287   < > = values in this interval not displayed.   Basic Metabolic Panel: Recent Labs  Lab 10/27/19 0328 10/28/19 0428 10/29/19 0341 10/30/19 0418 10/31/19 0444  NA 141 142  142 141 143 143  K 3.3* 3.2*  3.3* 3.4* 3.3* 3.3*  CL 107 108  108 109 109 112*  CO2 22 24  23 25 23 23   GLUCOSE 84 105*  106* 108* 111* 111*  BUN 32* 26*  25* 24* 20 15  CREATININE 1.68* 0.96  1.02 0.96 0.84 0.87  CALCIUM 9.2 9.4  9.4 9.0 9.0 8.8*  MG 1.9 1.3* 1.2* 1.3* 1.4*  PHOS 3.4 3.2  3.3 3.7 3.8 3.6   GFR: Estimated Creatinine Clearance: 171.1  mL/min (by C-G formula based on SCr of 0.87 mg/dL). Liver Function Tests: Recent Labs  Lab 10/25/19 0354 10/25/19 1515 10/27/19 0328 10/28/19 0428 10/29/19 0341 10/30/19 0418 10/31/19 0444  AST 26  --   --   --   --   --   --   ALT 31  --   --   --   --   --   --   ALKPHOS 59  --   --   --   --   --   --   BILITOT 1.2  --   --   --   --   --   --   PROT 7.3  --   --   --   --   --   --   ALBUMIN 2.4*   < > 2.0* 2.1*  2.1* 2.2* 2.3* 2.4*   < > = values  in this interval not displayed.   No results for input(s): LIPASE, AMYLASE in the last 168 hours. No results for input(s): AMMONIA in the last 168 hours. Coagulation Profile: No results for input(s): INR, PROTIME in the last 168 hours. Cardiac Enzymes: No results for input(s): CKTOTAL, CKMB, CKMBINDEX, TROPONINI in the last 168 hours. BNP (last 3 results) No results for input(s): PROBNP in the last 8760 hours. HbA1C: No results for input(s): HGBA1C in the last 72 hours. CBG: Recent Labs  Lab 10/30/19 1700 10/30/19 2027 10/31/19 0632 10/31/19 0737 10/31/19 1133  GLUCAP 90 103* 102* 101* 95   Lipid Profile: No results for input(s): CHOL, HDL, LDLCALC, TRIG, CHOLHDL, LDLDIRECT in the last 72 hours. Thyroid Function Tests: No results for input(s): TSH, T4TOTAL, FREET4, T3FREE, THYROIDAB in the last 72 hours. Anemia Panel: No results for input(s): VITAMINB12, FOLATE, FERRITIN, TIBC, IRON, RETICCTPCT in the last 72 hours. Sepsis Labs: No results for input(s): PROCALCITON, LATICACIDVEN in the last 168 hours.  Recent Results (from the past 240 hour(s))  Culture, blood (routine x 2)     Status: None   Collection Time: 10/21/19  9:38 PM   Specimen: BLOOD  Result Value Ref Range Status   Specimen Description BLOOD LEFT HAND  Final   Special Requests   Final    BOTTLES DRAWN AEROBIC AND ANAEROBIC Blood Culture adequate volume   Culture   Final    NO GROWTH 5 DAYS Performed at [facility], [address],  [place], [place] [address]    Report Status 10/26/2019 FINAL  Final  Culture, blood (routine x 2)     Status: None   Collection Time: 10/21/19  9:40 PM   Specimen: BLOOD  Result Value Ref Range Status   Specimen Description BLOOD RIGHT HAND  Final   Special Requests   Final    BOTTLES DRAWN AEROBIC AND ANAEROBIC Blood Culture adequate volume   Culture   Final    NO GROWTH 5 DAYS Performed at [facility], [address] [place], [place] [address]    Report Status 10/26/2019 FINAL  Final  MRSA PCR Screening     Status: None   Collection Time: 10/22/19  5:31 AM   Specimen: Nasopharyngeal  Result Value Ref Range Status   MRSA by PCR NEGATIVE NEGATIVE Final    Comment:        The GeneXpert MRSA Assay (FDA approved for NASAL specimens only), is one component of a comprehensive MRSA colonization surveillance program. It is not intended to diagnose MRSA infection nor to guide or monitor treatment for MRSA infections. Performed at [facility], [address], [place], [place] [address]          Radiology Studies: No results found.      Scheduled Meds: . chlorhexidine  15 mL Mouth Rinse BID  . Chlorhexidine Gluconate Cloth  6 each Topical Q0600  . diltiazem  60 mg Oral Q6H  . hydrALAZINE  75 mg Oral Q6H  . insulin aspart  0-9 Units Subcutaneous Q6H  . mouth rinse  15 mL Mouth Rinse q12n4p  . metoprolol succinate  100 mg Oral Daily  . mometasone-formoterol  2 puff Inhalation BID  . multivitamin with minerals  1 tablet Oral Daily  . pantoprazole  40 mg Oral Daily  . potassium chloride  20 mEq Oral TID  . Ensure Max Protein  11 oz Oral Daily  . sertraline  50 mg Oral Daily  . sodium  chloride flush  10-40 mL Intracatheter Q12H   Continuous Infusions: . heparin 2,250 Units/hr (10/31/19 0816)     LOS: 11 days    Briant Cedar, MD [facility]  If 7PM-7AM, please contact night-coverage [email or web address]  10/31/2019, 2:12 PM

## 2019-10-31 NOTE — Progress Notes (Signed)
Patient refused CPAP for tonight. Patient resting comfortably on 3L [place]. No respiratory distress noted. RT instructed patient to have RT called if he changes his mind. RT will monitor as needed.

## 2019-11-01 ENCOUNTER — Encounter (HOSPITAL_COMMUNITY): Payer: Self-pay | Admitting: Internal Medicine

## 2019-11-01 ENCOUNTER — Inpatient Hospital Stay (HOSPITAL_COMMUNITY): Payer: Medicare Other | Admitting: Anesthesiology

## 2019-11-01 ENCOUNTER — Encounter (HOSPITAL_COMMUNITY)
Admission: EM | Disposition: A | Discharge status: Skilled Nursing Facility | Payer: Self-pay | Source: Home / Self Care | Attending: Internal Medicine

## 2019-11-01 DIAGNOSIS — S86812A Strain of other muscle(s) and tendon(s) at lower leg level, left leg, initial encounter: Secondary | ICD-10-CM

## 2019-11-01 DIAGNOSIS — S76112D Strain of left quadriceps muscle, fascia and tendon, subsequent encounter: Secondary | ICD-10-CM

## 2019-11-01 DIAGNOSIS — S83242A Other tear of medial meniscus, current injury, left knee, initial encounter: Secondary | ICD-10-CM

## 2019-11-01 DIAGNOSIS — S76111A Strain of right quadriceps muscle, fascia and tendon, initial encounter: Secondary | ICD-10-CM

## 2019-11-01 HISTORY — PX: KNEE ARTHROSCOPY WITH PATELLAR TENDON REPAIR: SHX5656

## 2019-11-01 HISTORY — PX: KNEE ARTHROSCOPY: SHX127

## 2019-11-01 HISTORY — PX: TENDON REPAIR: SHX5111

## 2019-11-01 LAB — CBC
HCT: 32.7 % — ABNORMAL LOW (ref 39.0–52.0)
Hemoglobin: 10.1 g/dL — ABNORMAL LOW (ref 13.0–17.0)
MCH: 28.1 pg (ref 26.0–34.0)
MCHC: 30.9 g/dL (ref 30.0–36.0)
MCV: 91.1 fL (ref 80.0–100.0)
Platelets: 311 10*3/uL (ref 150–400)
RBC: 3.59 MIL/uL — ABNORMAL LOW (ref 4.22–5.81)
RDW: 15.8 % — ABNORMAL HIGH (ref 11.5–15.5)
WBC: 9.9 10*3/uL (ref 4.0–10.5)
nRBC: 0 % (ref 0.0–0.2)

## 2019-11-01 LAB — RENAL FUNCTION PANEL
Albumin: 2.5 g/dL — ABNORMAL LOW (ref 3.5–5.0)
Anion gap: 8 (ref 5–15)
BUN: 12 mg/dL (ref 6–20)
CO2: 24 mmol/L (ref 22–32)
Calcium: 9.1 mg/dL (ref 8.9–10.3)
Chloride: 110 mmol/L (ref 98–111)
Creatinine, Ser: 0.81 mg/dL (ref 0.61–1.24)
GFR calc Af Amer: >60 mL/min (ref >60)
GFR calc non Af Amer: >60 mL/min (ref >60)
Glucose, Bld: 103 mg/dL — ABNORMAL HIGH (ref 70–99)
Phosphorus: 3.4 mg/dL (ref 2.5–4.6)
Potassium: 3.6 mmol/L (ref 3.5–5.1)
Sodium: 142 mmol/L (ref 135–145)

## 2019-11-01 LAB — GLUCOSE, CAPILLARY
Glucose-Capillary: 88 mg/dL (ref 70–99)
Glucose-Capillary: 90 mg/dL (ref 70–99)
Glucose-Capillary: 90 mg/dL (ref 70–99)
Glucose-Capillary: 96 mg/dL (ref 70–99)

## 2019-11-01 LAB — MAGNESIUM: Magnesium: 1.6 mg/dL — ABNORMAL LOW (ref 1.7–2.4)

## 2019-11-01 LAB — HEPARIN LEVEL (UNFRACTIONATED): Heparin Unfractionated: 0.59 IU/mL (ref 0.30–0.70)

## 2019-11-01 SURGERY — TENDON REPAIR
Anesthesia: General | Site: Knee | Laterality: Right

## 2019-11-01 MED ORDER — MAGNESIUM SULFATE 4 GM/100ML IV SOLN
4.0000 g | Freq: Once | INTRAVENOUS | Status: AC
  Administered 2019-11-01: 4 g via INTRAVENOUS
  Filled 2019-11-01: qty 100

## 2019-11-01 MED ORDER — SUCCINYLCHOLINE CHLORIDE 200 MG/10ML IV SOSY
PREFILLED_SYRINGE | INTRAVENOUS | Status: AC
  Filled 2019-11-01: qty 10

## 2019-11-01 MED ORDER — VANCOMYCIN HCL 1000 MG IV SOLR
INTRAVENOUS | Status: AC
  Filled 2019-11-01: qty 1000

## 2019-11-01 MED ORDER — PHENYLEPHRINE 40 MCG/ML (10ML) SYRINGE FOR IV PUSH (FOR BLOOD PRESSURE SUPPORT)
PREFILLED_SYRINGE | INTRAVENOUS | Status: AC
  Filled 2019-11-01: qty 10

## 2019-11-01 MED ORDER — OXYCODONE HCL 5 MG PO TABS
5.0000 mg | ORAL_TABLET | Freq: Once | ORAL | Status: AC | PRN
  Administered 2019-11-01: 5 mg via ORAL

## 2019-11-01 MED ORDER — ONDANSETRON HCL 4 MG/2ML IJ SOLN
INTRAMUSCULAR | Status: DC | PRN
  Administered 2019-11-01: 4 mg via INTRAVENOUS

## 2019-11-01 MED ORDER — LIDOCAINE 2% (20 MG/ML) 5 ML SYRINGE
INTRAMUSCULAR | Status: AC
  Filled 2019-11-01: qty 5

## 2019-11-01 MED ORDER — TRANEXAMIC ACID-NACL 1000-0.7 MG/100ML-% IV SOLN
1000.0000 mg | INTRAVENOUS | Status: AC
  Administered 2019-11-01: 1000 mg via INTRAVENOUS
  Filled 2019-11-01: qty 100

## 2019-11-01 MED ORDER — VANCOMYCIN HCL 1000 MG IV SOLR
INTRAVENOUS | Status: DC | PRN
  Administered 2019-11-01 (×2): 1000 mg via TOPICAL

## 2019-11-01 MED ORDER — CHLORHEXIDINE GLUCONATE 4 % EX LIQD
60.0000 mL | Freq: Once | CUTANEOUS | Status: DC
  Filled 2019-11-01: qty 15

## 2019-11-01 MED ORDER — HYDROMORPHONE HCL 1 MG/ML IJ SOLN
0.5000 mg | INTRAMUSCULAR | Status: DC | PRN
  Administered 2019-11-02 – 2019-11-03 (×2): 0.5 mg via INTRAVENOUS
  Filled 2019-11-01 (×2): qty 1

## 2019-11-01 MED ORDER — DEXAMETHASONE SODIUM PHOSPHATE 10 MG/ML IJ SOLN
INTRAMUSCULAR | Status: AC
  Filled 2019-11-01: qty 2

## 2019-11-01 MED ORDER — ACETAMINOPHEN 500 MG PO TABS
1000.0000 mg | ORAL_TABLET | Freq: Once | ORAL | Status: AC
  Administered 2019-11-01: 1000 mg via ORAL
  Filled 2019-11-01: qty 2

## 2019-11-01 MED ORDER — MORPHINE SULFATE 4 MG/ML IJ SOLN
INTRAMUSCULAR | Status: DC | PRN
  Administered 2019-11-01: 8 mg via INTRAVENOUS

## 2019-11-01 MED ORDER — METOCLOPRAMIDE HCL 5 MG PO TABS: 5.0000 mg | ORAL_TABLET | Freq: Three times a day (TID) | ORAL | Status: DC | PRN

## 2019-11-01 MED ORDER — BUPIVACAINE HCL (PF) 0.25 % IJ SOLN
INTRAMUSCULAR | Status: AC
  Filled 2019-11-01: qty 60

## 2019-11-01 MED ORDER — ONDANSETRON HCL 4 MG PO TABS
4.0000 mg | ORAL_TABLET | Freq: Four times a day (QID) | ORAL | Status: DC | PRN
  Administered 2019-11-07 – 2019-11-08 (×2): 4 mg via ORAL
  Filled 2019-11-01 (×2): qty 1

## 2019-11-01 MED ORDER — 0.9 % SODIUM CHLORIDE (POUR BTL) OPTIME
TOPICAL | Status: DC | PRN
  Administered 2019-11-01 (×5): 1000 mL

## 2019-11-01 MED ORDER — PROMETHAZINE HCL 25 MG/ML IJ SOLN: 6.2500 mg | INTRAMUSCULAR | Status: DC | PRN

## 2019-11-01 MED ORDER — LIDOCAINE 2% (20 MG/ML) 5 ML SYRINGE
INTRAMUSCULAR | Status: DC | PRN
  Administered 2019-11-01: 60 mg via INTRAVENOUS

## 2019-11-01 MED ORDER — SODIUM CHLORIDE 0.9 % IR SOLN
Status: DC | PRN
  Administered 2019-11-01: 6000 mL

## 2019-11-01 MED ORDER — SUCCINYLCHOLINE CHLORIDE 200 MG/10ML IV SOSY
PREFILLED_SYRINGE | INTRAVENOUS | Status: DC | PRN
  Administered 2019-11-01: 180 mg via INTRAVENOUS

## 2019-11-01 MED ORDER — HYDROMORPHONE HCL 1 MG/ML IJ SOLN
0.2500 mg | INTRAMUSCULAR | Status: DC | PRN
  Administered 2019-11-01 (×4): 0.5 mg via INTRAVENOUS

## 2019-11-01 MED ORDER — EPHEDRINE 5 MG/ML INJ
INTRAVENOUS | Status: AC
  Filled 2019-11-01: qty 10

## 2019-11-01 MED ORDER — FENTANYL CITRATE (PF) 250 MCG/5ML IJ SOLN
INTRAMUSCULAR | Status: AC
  Filled 2019-11-01: qty 5

## 2019-11-01 MED ORDER — SUGAMMADEX SODIUM 200 MG/2ML IV SOLN
INTRAVENOUS | Status: DC | PRN
  Administered 2019-11-01: 400 mg via INTRAVENOUS

## 2019-11-01 MED ORDER — DEXAMETHASONE SODIUM PHOSPHATE 10 MG/ML IJ SOLN
INTRAMUSCULAR | Status: DC | PRN
  Administered 2019-11-01: 10 mg via INTRAVENOUS

## 2019-11-01 MED ORDER — ROCURONIUM BROMIDE 10 MG/ML (PF) SYRINGE
PREFILLED_SYRINGE | INTRAVENOUS | Status: DC | PRN
  Administered 2019-11-01: 100 mg via INTRAVENOUS
  Administered 2019-11-01: 30 mg via INTRAVENOUS
  Administered 2019-11-01: 40 mg via INTRAVENOUS
  Administered 2019-11-01: 30 mg via INTRAVENOUS

## 2019-11-01 MED ORDER — MORPHINE SULFATE (PF) 4 MG/ML IV SOLN
INTRAVENOUS | Status: AC
  Filled 2019-11-01: qty 1

## 2019-11-01 MED ORDER — BUPIVACAINE HCL (PF) 0.25 % IJ SOLN
INTRAMUSCULAR | Status: DC | PRN
  Administered 2019-11-01: 30 mL

## 2019-11-01 MED ORDER — HYDROMORPHONE HCL 1 MG/ML IJ SOLN
INTRAMUSCULAR | Status: AC
  Filled 2019-11-01: qty 1

## 2019-11-01 MED ORDER — PROPOFOL 10 MG/ML IV BOLUS
INTRAVENOUS | Status: DC | PRN
  Administered 2019-11-01: 200 mg via INTRAVENOUS

## 2019-11-01 MED ORDER — ROCURONIUM BROMIDE 10 MG/ML (PF) SYRINGE
PREFILLED_SYRINGE | INTRAVENOUS | Status: AC
  Filled 2019-11-01: qty 10

## 2019-11-01 MED ORDER — DEXTROSE 5 % IV SOLN
3.0000 g | INTRAVENOUS | Status: AC
  Administered 2019-11-01 (×2): 3 g via INTRAVENOUS
  Filled 2019-11-01: qty 3000

## 2019-11-01 MED ORDER — MIDAZOLAM HCL 5 MG/5ML IJ SOLN
INTRAMUSCULAR | Status: DC | PRN
  Administered 2019-11-01: 2 mg via INTRAVENOUS

## 2019-11-01 MED ORDER — CHLORHEXIDINE GLUCONATE 4 % EX LIQD
CUTANEOUS | Status: AC
  Filled 2019-11-01: qty 15

## 2019-11-01 MED ORDER — EPINEPHRINE PF 1 MG/ML IJ SOLN
INTRAMUSCULAR | Status: AC
  Filled 2019-11-01: qty 2

## 2019-11-01 MED ORDER — OXYCODONE HCL 5 MG/5ML PO SOLN: 5.0000 mg | Freq: Once | ORAL | Status: AC | PRN

## 2019-11-01 MED ORDER — CEFAZOLIN SODIUM 1 G IJ SOLR
INTRAMUSCULAR | Status: AC
  Filled 2019-11-01: qty 50

## 2019-11-01 MED ORDER — DEXMEDETOMIDINE HCL 200 MCG/2ML IV SOLN
INTRAVENOUS | Status: DC | PRN
  Administered 2019-11-01 (×4): 20 ug via INTRAVENOUS

## 2019-11-01 MED ORDER — PROPOFOL 10 MG/ML IV BOLUS
INTRAVENOUS | Status: AC
  Filled 2019-11-01: qty 20

## 2019-11-01 MED ORDER — LACTATED RINGERS IV SOLN: INTRAVENOUS | Status: DC | PRN

## 2019-11-01 MED ORDER — MORPHINE SULFATE (PF) 4 MG/ML IV SOLN
INTRAVENOUS | Status: AC
  Filled 2019-11-01: qty 2

## 2019-11-01 MED ORDER — EPINEPHRINE PF 1 MG/ML IJ SOLN
INTRAMUSCULAR | Status: AC
  Filled 2019-11-01: qty 1

## 2019-11-01 MED ORDER — FENTANYL CITRATE (PF) 250 MCG/5ML IJ SOLN
INTRAMUSCULAR | Status: DC | PRN
  Administered 2019-11-01 (×4): 50 ug via INTRAVENOUS
  Administered 2019-11-01: 150 ug via INTRAVENOUS
  Administered 2019-11-01 (×3): 50 ug via INTRAVENOUS

## 2019-11-01 MED ORDER — MIDAZOLAM HCL 2 MG/2ML IJ SOLN
INTRAMUSCULAR | Status: AC
  Filled 2019-11-01: qty 2

## 2019-11-01 MED ORDER — APIXABAN 5 MG PO TABS
5.0000 mg | ORAL_TABLET | Freq: Two times a day (BID) | ORAL | Status: DC
  Administered 2019-11-02 – 2019-11-09 (×16): 5 mg via ORAL
  Filled 2019-11-01 (×16): qty 1

## 2019-11-01 MED ORDER — METOCLOPRAMIDE HCL 5 MG/ML IJ SOLN: 5.0000 mg | Freq: Three times a day (TID) | INTRAMUSCULAR | Status: DC | PRN

## 2019-11-01 MED ORDER — DOCUSATE SODIUM 100 MG PO CAPS
100.0000 mg | ORAL_CAPSULE | Freq: Two times a day (BID) | ORAL | Status: DC
  Administered 2019-11-03 (×2): 100 mg via ORAL
  Filled 2019-11-01 (×3): qty 1

## 2019-11-01 MED ORDER — ONDANSETRON HCL 4 MG/2ML IJ SOLN
4.0000 mg | Freq: Four times a day (QID) | INTRAMUSCULAR | Status: DC | PRN
  Administered 2019-11-04 – 2019-11-06 (×2): 4 mg via INTRAVENOUS

## 2019-11-01 MED ORDER — EPINEPHRINE PF 1 MG/ML IJ SOLN
INTRAMUSCULAR | Status: AC
  Filled 2019-11-01: qty 4

## 2019-11-01 MED ORDER — ACETAMINOPHEN 500 MG PO TABS
1000.0000 mg | ORAL_TABLET | Freq: Four times a day (QID) | ORAL | Status: AC
  Administered 2019-11-02 (×4): 1000 mg via ORAL
  Filled 2019-11-01 (×4): qty 2

## 2019-11-01 MED ORDER — CEFAZOLIN SODIUM-DEXTROSE 2-4 GM/100ML-% IV SOLN
2.0000 g | Freq: Three times a day (TID) | INTRAVENOUS | Status: AC
  Administered 2019-11-02 (×2): 2 g via INTRAVENOUS
  Filled 2019-11-01 (×2): qty 100

## 2019-11-01 MED ORDER — ONDANSETRON HCL 4 MG/2ML IJ SOLN
INTRAMUSCULAR | Status: AC
  Filled 2019-11-01: qty 2

## 2019-11-01 MED ORDER — CLONIDINE HCL (ANALGESIA) 100 MCG/ML EP SOLN
EPIDURAL | Status: DC | PRN
  Administered 2019-11-01: 100 ug

## 2019-11-01 MED ORDER — OXYCODONE HCL 5 MG PO TABS
5.0000 mg | ORAL_TABLET | ORAL | Status: DC | PRN
  Administered 2019-11-02: 5 mg via ORAL
  Administered 2019-11-02 – 2019-11-07 (×6): 10 mg via ORAL
  Filled 2019-11-01 (×7): qty 2

## 2019-11-01 MED ORDER — LOPERAMIDE HCL 2 MG PO CAPS
2.0000 mg | ORAL_CAPSULE | Freq: Every day | ORAL | Status: DC | PRN
  Administered 2019-11-01 – 2019-11-07 (×3): 2 mg via ORAL
  Filled 2019-11-01 (×3): qty 1

## 2019-11-01 MED ORDER — OXYCODONE HCL 5 MG PO TABS
ORAL_TABLET | ORAL | Status: AC
  Filled 2019-11-01: qty 1

## 2019-11-01 MED ORDER — BUPIVACAINE-EPINEPHRINE 0.25% -1:200000 IJ SOLN
INTRAMUSCULAR | Status: DC | PRN
  Administered 2019-11-01: 30 mL

## 2019-11-01 SURGICAL SUPPLY — 127 items
ANCHOR SUT BIO SW 4.75X19.1 (Anchor) ×8 IMPLANT
BANDAGE ESMARK 6X9 LF (GAUZE/BANDAGES/DRESSINGS) ×2 IMPLANT
BENZOIN TINCTURE PRP APPL 2/3 (GAUZE/BANDAGES/DRESSINGS) IMPLANT
BIT DRILL 7/64X5 DISP (BIT) ×4 IMPLANT
BLADE 15 SAFETY STRL DISP (BLADE) ×12 IMPLANT
BLADE CLIPPER SURG (BLADE) IMPLANT
BLADE EXCALIBUR 4.0MM X 13CM (MISCELLANEOUS) ×1
BLADE EXCALIBUR 4.0X13 (MISCELLANEOUS) ×3 IMPLANT
BNDG COHESIVE 1X5 TAN STRL LF (GAUZE/BANDAGES/DRESSINGS) IMPLANT
BNDG COHESIVE 4X5 TAN STRL (GAUZE/BANDAGES/DRESSINGS) ×4 IMPLANT
BNDG CONFORM 3 STRL LF (GAUZE/BANDAGES/DRESSINGS) IMPLANT
BNDG ELASTIC 2X5.8 VLCR STR LF (GAUZE/BANDAGES/DRESSINGS) IMPLANT
BNDG ELASTIC 4X5.8 VLCR STR LF (GAUZE/BANDAGES/DRESSINGS) ×4 IMPLANT
BNDG ELASTIC 6X10 VLCR STRL LF (GAUZE/BANDAGES/DRESSINGS) ×8 IMPLANT
BNDG ELASTIC 6X5.8 VLCR STR LF (GAUZE/BANDAGES/DRESSINGS) IMPLANT
BNDG ESMARK 4X9 LF (GAUZE/BANDAGES/DRESSINGS) IMPLANT
BNDG ESMARK 6X9 LF (GAUZE/BANDAGES/DRESSINGS) ×4
BNDG GAUZE ELAST 4 BULKY (GAUZE/BANDAGES/DRESSINGS) IMPLANT
CATH FOLEY 2WAY SLVR  5CC 16FR (CATHETERS) ×2
CATH FOLEY 2WAY SLVR 5CC 16FR (CATHETERS) ×2 IMPLANT
CHLORAPREP W/TINT 26 (MISCELLANEOUS) ×16 IMPLANT
CLOSURE WOUND 1/2 X4 (GAUZE/BANDAGES/DRESSINGS) ×4
CORD BIPOLAR FORCEPS 12FT (ELECTRODE) ×4 IMPLANT
COUNTER NEEDLE 20 DBL MAG RED (NEEDLE) ×4 IMPLANT
COVER MAYO STAND STRL (DRAPES) ×4 IMPLANT
COVER SURGICAL LIGHT HANDLE (MISCELLANEOUS) ×8 IMPLANT
COVER WAND RF STERILE (DRAPES) IMPLANT
CUFF TOURN SGL QUICK 18X4 (TOURNIQUET CUFF) ×4 IMPLANT
CUFF TOURN SGL QUICK 24 (TOURNIQUET CUFF)
CUFF TOURN SGL QUICK 34 (TOURNIQUET CUFF)
CUFF TOURN SGL QUICK 42 (TOURNIQUET CUFF) ×8 IMPLANT
CUFF TRNQT CYL 24X4X16.5-23 (TOURNIQUET CUFF) IMPLANT
CUFF TRNQT CYL 34X4.125X (TOURNIQUET CUFF) IMPLANT
DRAPE ARTHROSCOPY W/POUCH 114 (DRAPES) ×4 IMPLANT
DRAPE INCISE IOBAN 66X45 STRL (DRAPES) ×12 IMPLANT
DRAPE OEC MINIVIEW 54X84 (DRAPES) IMPLANT
DRAPE U-SHAPE 47X51 STRL (DRAPES) ×8 IMPLANT
DRESSING AQUACEL AG SP 3.5X10 (GAUZE/BANDAGES/DRESSINGS) ×4 IMPLANT
DRSG ADAPTIC 3X8 NADH LF (GAUZE/BANDAGES/DRESSINGS) IMPLANT
DRSG AQUACEL AG SP 3.5X10 (GAUZE/BANDAGES/DRESSINGS) ×8
DRSG EMULSION OIL 3X3 NADH (GAUZE/BANDAGES/DRESSINGS) IMPLANT
DRSG PAD ABDOMINAL 8X10 ST (GAUZE/BANDAGES/DRESSINGS) IMPLANT
DRSG TEGADERM 4X4.75 (GAUZE/BANDAGES/DRESSINGS) ×4 IMPLANT
DURAPREP 26ML APPLICATOR (WOUND CARE) IMPLANT
DW OUTFLOW CASSETTE/TUBE SET (MISCELLANEOUS) ×4 IMPLANT
ELECT REM PT RETURN 9FT ADLT (ELECTROSURGICAL) ×4
ELECTRODE REM PT RTRN 9FT ADLT (ELECTROSURGICAL) ×2 IMPLANT
GAUZE SPONGE 2X2 8PLY STRL LF (GAUZE/BANDAGES/DRESSINGS) IMPLANT
GAUZE SPONGE 4X4 12PLY STRL (GAUZE/BANDAGES/DRESSINGS) ×8 IMPLANT
GAUZE XEROFORM 1X8 LF (GAUZE/BANDAGES/DRESSINGS) IMPLANT
GAUZE XEROFORM 5X9 LF (GAUZE/BANDAGES/DRESSINGS) ×12 IMPLANT
GLOVE BIOGEL PI IND STRL 7.0 (GLOVE) ×4 IMPLANT
GLOVE BIOGEL PI IND STRL 8 (GLOVE) ×4 IMPLANT
GLOVE BIOGEL PI INDICATOR 7.0 (GLOVE) ×4
GLOVE BIOGEL PI INDICATOR 8 (GLOVE) ×4
GLOVE ECLIPSE 8.0 STRL XLNG CF (GLOVE) ×12 IMPLANT
GLOVE SS BIOGEL STRL SZ 7 (GLOVE) ×2 IMPLANT
GLOVE SUPERSENSE BIOGEL SZ 7 (GLOVE) ×2
GOWN STRL REUS W/ TWL LRG LVL3 (GOWN DISPOSABLE) ×10 IMPLANT
GOWN STRL REUS W/ TWL XL LVL3 (GOWN DISPOSABLE) ×2 IMPLANT
GOWN STRL REUS W/TWL 2XL LVL3 (GOWN DISPOSABLE) ×4 IMPLANT
GOWN STRL REUS W/TWL LRG LVL3 (GOWN DISPOSABLE) ×10
GOWN STRL REUS W/TWL XL LVL3 (GOWN DISPOSABLE) ×2
IMMOBILIZER KNEE 22 UNIV (SOFTGOODS) ×8 IMPLANT
IMP SYS 2ND FIX PEEK 4.75X19.1 (Miscellaneous) ×4 IMPLANT
IMPL SYS 2ND FX PEEK 4.75X19.1 (Miscellaneous) ×2 IMPLANT
KIT BASIN OR (CUSTOM PROCEDURE TRAY) ×4 IMPLANT
KIT TURNOVER KIT B (KITS) ×4 IMPLANT
MANIFOLD NEPTUNE II (INSTRUMENTS) ×8 IMPLANT
NEEDLE 1/2 CIR MAYO (NEEDLE) ×8 IMPLANT
NEEDLE 18GX1X1/2 (RX/OR ONLY) (NEEDLE) IMPLANT
NEEDLE FILTER BLUNT 18X 1/2SAF (NEEDLE) ×2
NEEDLE FILTER BLUNT 18X1 1/2 (NEEDLE) ×2 IMPLANT
NEEDLE HYPO 25GX1X1/2 BEV (NEEDLE) ×4 IMPLANT
NEEDLE MAYO TROCAR (NEEDLE) ×4 IMPLANT
NEEDLE TAPERED W/ NITINOL LOOP (MISCELLANEOUS) ×4 IMPLANT
NS IRRIG 1000ML POUR BTL (IV SOLUTION) ×24 IMPLANT
PACK ARTHROSCOPY DSU (CUSTOM PROCEDURE TRAY) ×4 IMPLANT
PACK ORTHO EXTREMITY (CUSTOM PROCEDURE TRAY) ×4 IMPLANT
PAD ARMBOARD 7.5X6 YLW CONV (MISCELLANEOUS) ×8 IMPLANT
PADDING CAST COTTON 6X4 STRL (CAST SUPPLIES) ×8 IMPLANT
PASSER SUT SWANSON 36MM LOOP (INSTRUMENTS) ×4 IMPLANT
PENCIL BUTTON HOLSTER BLD 10FT (ELECTRODE) IMPLANT
SPECIMEN JAR SMALL (MISCELLANEOUS) IMPLANT
SPONGE GAUZE 2X2 STER 10/PKG (GAUZE/BANDAGES/DRESSINGS)
SPONGE LAP 18X18 RF (DISPOSABLE) ×16 IMPLANT
SPONGE LAP 4X18 RFD (DISPOSABLE) ×4 IMPLANT
STAPLER VISISTAT 35W (STAPLE) IMPLANT
STOCKINETTE IMPERVIOUS 9X36 MD (GAUZE/BANDAGES/DRESSINGS) ×4 IMPLANT
STRIP CLOSURE SKIN 1/2X4 (GAUZE/BANDAGES/DRESSINGS) ×12 IMPLANT
SUCTION FRAZIER HANDLE 10FR (MISCELLANEOUS) ×2
SUCTION TUBE FRAZIER 10FR DISP (MISCELLANEOUS) ×2 IMPLANT
SUT ETHIBOND 4 0 TF (SUTURE) IMPLANT
SUT ETHIBOND 5 0 P 3 (SUTURE)
SUT ETHILON 3 0 PS 1 (SUTURE) ×8 IMPLANT
SUT ETHILON 4 0 P 3 18 (SUTURE) IMPLANT
SUT ETHILON 5 0 P 3 18 (SUTURE)
SUT FIBERWIRE #2 38 T-5 BLUE (SUTURE) ×4
SUT MNCRL AB 3-0 PS2 27 (SUTURE) ×8 IMPLANT
SUT NYLON ETHILON 5-0 P-3 1X18 (SUTURE) IMPLANT
SUT POLY ETHIBOND 5-0 P-3 1X18 (SUTURE) IMPLANT
SUT PROLENE 4 0 P 3 18 (SUTURE) IMPLANT
SUT SILK 4 0 PS 2 (SUTURE) IMPLANT
SUT VIC AB 0 CT1 27 (SUTURE) ×18
SUT VIC AB 0 CT1 27XBRD ANBCTR (SUTURE) ×18 IMPLANT
SUT VIC AB 1 CT1 27 (SUTURE) ×16
SUT VIC AB 1 CT1 27XBRD ANBCTR (SUTURE) ×16 IMPLANT
SUT VIC AB 2-0 CT1 27 (SUTURE) ×10
SUT VIC AB 2-0 CT1 TAPERPNT 27 (SUTURE) ×10 IMPLANT
SUT VIC AB 3-0 FS2 27 (SUTURE) ×4 IMPLANT
SUT VICRYL 0 UR6 27IN ABS (SUTURE) ×24 IMPLANT
SUTURE FIBERWR #2 38 T-5 BLUE (SUTURE) ×2 IMPLANT
SUTURE TAPE 1.3 40 TPR END (SUTURE) ×16 IMPLANT
SUTURETAPE 1.3 40 TPR END (SUTURE) ×32
SYR 20ML ECCENTRIC (SYRINGE) ×4 IMPLANT
SYR 3ML LL SCALE MARK (SYRINGE) ×4 IMPLANT
SYR CONTROL 10ML LL (SYRINGE) ×4 IMPLANT
SYR TB 1ML LUER SLIP (SYRINGE) ×4 IMPLANT
TOWEL GREEN STERILE (TOWEL DISPOSABLE) ×4 IMPLANT
TOWEL GREEN STERILE FF (TOWEL DISPOSABLE) ×4 IMPLANT
TUBE CONNECTING 12'X1/4 (SUCTIONS) ×2
TUBE CONNECTING 12X1/4 (SUCTIONS) ×6 IMPLANT
TUBING ARTHROSCOPY IRRIG 16FT (MISCELLANEOUS) ×4 IMPLANT
UNDERPAD 30X36 HEAVY ABSORB (UNDERPADS AND DIAPERS) ×8 IMPLANT
WAND STAR VAC 90 (SURGICAL WAND) IMPLANT
WATER STERILE IRR 1000ML POUR (IV SOLUTION) ×4 IMPLANT
YANKAUER SUCT BULB TIP NO VENT (SUCTIONS) ×8 IMPLANT

## 2019-11-01 NOTE — Anesthesia Preprocedure Evaluation (Addendum)
Anesthesia Evaluation  Patient identified by MRN, date of birth, ID band Patient awake    Reviewed: Allergy & Precautions, NPO status , Patient's Chart, lab work & pertinent test results  Airway Mallampati: III  TM Distance: >3 FB Neck ROM: Full    Dental no notable dental hx.    Pulmonary sleep apnea and Continuous Positive Airway Pressure Ventilation , former smoker,    Pulmonary exam normal breath sounds clear to auscultation       Cardiovascular hypertension, Pt. on medications and Pt. on home beta blockers +CHF  Normal cardiovascular exam+ dysrhythmias Atrial Fibrillation  Rhythm:Regular Rate:Normal  ECG: ST, rate 121. Sinus tachycardia Left ventricular hypertrophy with repolarization abnormality  ECHO: 1. Technically difficult echo with poor image quality. 2. Left ventricular ejection fraction, by estimation, is 50 to 55%. The left ventricle has low normal function. The left ventricle has no regional wall motion abnormalities. There is moderate left ventricular hypertrophy. Left ventricular diastolic function could not be evaluated. 3. Right ventricular systolic function was not well visualized. The right ventricular size is not well visualized. 4. The mitral valve is grossly normal. No evidence of mitral valve regurgitation. No evidence of mitral stenosis. 5. The aortic valve is grossly normal. Aortic valve regurgitation is not visualized. No aortic stenosis is present.   Neuro/Psych PSYCHIATRIC DISORDERS Depression negative neurological ROS     GI/Hepatic Neg liver ROS, GERD  Medicated and Controlled,  Endo/Other  diabetes, Oral Hypoglycemic AgentsMorbid obesity (SUPER)  Renal/GU negative Renal ROS     Musculoskeletal Gout   Abdominal   Peds  Hematology  (+) anemia , HLD   Anesthesia Other Findings Bilateral knee injury  Reproductive/Obstetrics                             Anesthesia Physical Anesthesia Plan  ASA: IV  Anesthesia Plan: General   Post-op Pain Management:    Induction: Intravenous  PONV Risk Score and Plan: 2 and Ondansetron, Dexamethasone and Treatment may vary due to age or medical condition  Airway Management Planned: Oral ETT  Additional Equipment:   Intra-op Plan:   Post-operative Plan: Extubation in OR  Informed Consent: I have reviewed the patients History and Physical, chart, labs and discussed the procedure including the risks, benefits and alternatives for the proposed anesthesia with the patient or authorized representative who has indicated his/her understanding and acceptance.     Dental advisory given  Plan Discussed with: CRNA  Anesthesia Plan Comments:        Anesthesia Quick Evaluation

## 2019-11-01 NOTE — Progress Notes (Signed)
ANTICOAGULATION CONSULT NOTE  Pharmacy Consult for heparin Indication: Afib  Allergies  Allergen Reactions  . Benazepril Anaphylaxis  . Betadine [Povidone Iodine]     rash  . Prednisone Other (See Comments)    Headache.    Patient Measurements: Height: 6' (182.9 cm) Weight: (!) 182.1 kg (401 lb 7.3 oz) IBW/kg (Calculated) : 77.6 Heparin Dosing Weight: 123kg  Vital Signs: Temp: 98.2 F (36.8 C) (05/18 0356) Temp Source: Oral (05/18 0356) BP: 138/76 (05/18 0356) Pulse Rate: 87 (05/18 0356)  Labs: Recent Labs    10/30/19 0418 10/30/19 0418 10/31/19 0443 10/31/19 0444 11/01/19 0625  HGB 10.2*   < > 10.0*  --  10.1*  HCT 33.4*  --  32.7*  --  32.7*  PLT 290  --  287  --  311  HEPARINUNFRC 0.52  --   --  0.63 0.59  CREATININE 0.84  --   --  0.87 0.81   < > = values in this interval not displayed.    Estimated Creatinine Clearance: 184.3 mL/min (by C-G formula based on SCr of 0.81 mg/dL).  Assessment: 49 yo male with h/o AFib continues on heparin. He was on apixaban PTA which has been on hold.     Heparin level therapeutic this AM at 0.59 at rate of 2250 units/hr. CBC is stable. No bleeding noted. Planning back to OR this afternoon with heparin to be turned off soon.   Goal of Therapy:  Heparin level 0.3-0.7 units/ml Monitor platelets by anticoagulation protocol: Yes   Plan:  Continue heparin gtt 2250 units/hr Daily heparin level and CBC F/u post-op anticoag plan  Lysle Pearl, PharmD, BCPS Clinical Pharmacist Please see AMION for all pharmacy numbers 11/01/2019 7:51 AM

## 2019-11-01 NOTE — Progress Notes (Signed)
Plan for bilateral knee surgical procedure this afternoon. Discussed with patient.  Plan to hold heparin 6 hours prior to procedure which is around 9 AM.  Patient agrees with plan.

## 2019-11-01 NOTE — OR Nursing (Signed)
1720 Dr. August Saucer spoke with pt. Regarding allergy to betadine. Pt. States liquid betadine causes a rash. Dr. August Saucer discussed with pt about the use of ioban drape.  Pt. States he feels this will not cause any skin issues; therefore ioban drapes will be used during procedure.

## 2019-11-01 NOTE — Brief Op Note (Signed)
   11/01/2019  10:53 PM  PATIENT:  Douglas [name]  49 y.o. male  PRE-OPERATIVE DIAGNOSIS:  FALL/BILATERAL KNEE PAIN  POST-OPERATIVE DIAGNOSIS:  FALL/BILATERAL KNEE PAIN  PROCEDURE:  Procedure(s): Chronic QUADRICEP TENDON REPAIR ARTHROSCOPY KNEE AND MENISCUS DEBRIDEMENT left knee   Chronic PATELLAR TENDON REPAIR right knee  SURGEON:  Surgeon(s): August Saucer, Corrie Mckusick, MD  ASSISTANT: Magnant PA  ANESTHESIA:   general  EBL: 75 ml    Total I/O In: 1700 [I.V.:1700] Out: 50 [Blood:50]  BLOOD ADMINISTERED: none  DRAINS: none   LOCAL MEDICATIONS USED: Marcaine morphine clonidine and 1 g of Vanco in each knee  SPECIMEN:  No Specimen  COUNTS:  YES  TOURNIQUET:   Total Tourniquet Time Documented: Thigh (Left) - 60 minutes Total: Thigh (Left) - 60 minutes  Thigh (Right) - 33 minutes Total: Thigh (Right) - 33 minutes   DICTATION: .030131  PLAN OF CARE: Admit to inpatient   PATIENT DISPOSITION:  PACU - hemodynamically stable

## 2019-11-01 NOTE — Progress Notes (Signed)
PT Cancellation Note  Patient Details Name: Douglas [name] MRN: [id number] DOB: 07/10/1970   Cancelled Treatment:    Reason Eval/Treat Not Completed: (P) Patient at procedure or test/unavailable(Pt off unit for surgery to B LEs.  PT to follow up for re-eval tomorrow.)   Aimee Artis Delay 11/01/2019, 2:33 PM  Bonney Leitz , PTA Acute Rehabilitation Services Pager [phone] Office [phone]

## 2019-11-01 NOTE — Transfer of Care (Signed)
Immediate Anesthesia Transfer of Care Note  Patient: Douglas [name]  Procedure(s) Performed: QUADRICEP TENDON REPAIR (Left Knee) ARTHROSCOPY KNEE AND MENISCUS DEBRIDEMENT (Left Knee) KNEE ARTHROSCOPY WITH PATELLAR TENDON REPAIR (Right Knee)  Patient Location: PACU  Anesthesia Type:General  Level of Consciousness: awake and patient cooperative  Airway & Oxygen Therapy: Patient Spontanous Breathing and Patient connected to face mask oxygen  Post-op Assessment: Report given to RN and Post -op Vital signs reviewed and stable  Post vital signs: Reviewed and stable  Last Vitals:  Vitals Value Taken Time  BP 116/77 11/01/19 2245  Temp    Pulse 93 11/01/19 2246  Resp 21 11/01/19 2246  SpO2 99 % 11/01/19 2246  Vitals shown include unvalidated device data.  Last Pain:  Vitals:   11/01/19 0929  TempSrc:   PainSc: 0-No pain      Patients Stated Pain Goal: 3 (10/25/19 0800)  Complications: No apparent anesthesia complications

## 2019-11-01 NOTE — OR Nursing (Signed)
Care of patient assumed at 75.

## 2019-11-01 NOTE — Consult Note (Signed)
Reason for Consult: Bilateral extensor mechanism problems Referring Physician: Dr. Neal Dy Terrill Douglas [name] is an 49 y.o. male.  HPI: Douglas [name] is a 49 year old patient sustained a fall a month ago.  Has had multiple medical issues since that time but he is ambulatory and presents now for examination under anesthesia and likely repair of patellar tendon rupture on the right as well as quad tendon rupture on the left with meniscal pathology.  MRI scan confirms pathology.  Patient has been on heparin but that was stopped approximately 7 hours ago.  We will restart him on DVT prophylaxis tomorrow.  Past Medical History:  Diagnosis Date  . CHF (congestive heart failure) (HCC)   . Degenerative arthritis   . Depression   . Diabetes mellitus without complication (HCC)   . GERD (gastroesophageal reflux disease)   . Gout   . HLD (hyperlipidemia)   . Hypertension   . Seasonal allergies     History reviewed. No pertinent surgical history.  History reviewed. No pertinent family history.  Social History:  reports that he quit smoking about 5 years ago. He has never used smokeless tobacco. He reports previous alcohol use. He reports that he does not use drugs.  Allergies:  Allergies  Allergen Reactions  . Benazepril Anaphylaxis  . Betadine [Povidone Iodine]     rash  . Prednisone Other (See Comments)    Headache.    Medications: I have reviewed the patient's current medications.  Results for orders placed or performed during the hospital encounter of 10/20/19 (from the past 48 hour(s))  Glucose, capillary     Status: None   Collection Time: 10/30/19  5:00 PM  Result Value Ref Range   Glucose-Capillary 90 70 - 99 mg/dL    Comment: Glucose reference range applies only to samples taken after fasting for at least 8 hours.  Glucose, capillary     Status: Abnormal   Collection Time: 10/30/19  8:27 PM  Result Value Ref Range   Glucose-Capillary 103 (H) 70 - 99 mg/dL    Comment: Glucose  reference range applies only to samples taken after fasting for at least 8 hours.  CBC     Status: Abnormal   Collection Time: 10/31/19  4:43 AM  Result Value Ref Range   WBC 10.9 (H) 4.0 - 10.5 K/uL   RBC 3.56 (L) 4.22 - 5.81 MIL/uL   Hemoglobin 10.0 (L) 13.0 - 17.0 g/dL   HCT 41.9 (L) 62.2 - 29.7 %   MCV 91.9 80.0 - 100.0 fL   MCH 28.1 26.0 - 34.0 pg   MCHC 30.6 30.0 - 36.0 g/dL   RDW 98.9 21.1 - 94.1 %   Platelets 287 150 - 400 K/uL   nRBC 0.0 0.0 - 0.2 %    Comment: Performed at [facility], [address], [place], [place] [address]  Renal function panel (daily at 0500)     Status: Abnormal   Collection Time: 10/31/19  4:44 AM  Result Value Ref Range   Sodium 143 135 - 145 mmol/L   Potassium 3.3 (L) 3.5 - 5.1 mmol/L   Chloride 112 (H) 98 - 111 mmol/L   CO2 23 22 - 32 mmol/L   Glucose, Bld 111 (H) 70 - 99 mg/dL    Comment: Glucose reference range applies only to samples taken after fasting for at least 8 hours.   BUN 15 6 - 20 mg/dL   Creatinine, Ser 4.48 0.61 - 1.24 mg/dL   Calcium 8.8 (  L) 8.9 - 10.3 mg/dL   Phosphorus 3.6 2.5 - 4.6 mg/dL   Albumin 2.4 (L) 3.5 - 5.0 g/dL   GFR calc non Af Amer >60 >60 mL/min   GFR calc Af Amer >60 >60 mL/min   Anion gap 8 5 - 15    Comment: Performed at [facility], [address], [place], [place] [address]  Heparin level (unfractionated)     Status: None   Collection Time: 10/31/19  4:44 AM  Result Value Ref Range   Heparin Unfractionated 0.63 0.30 - 0.70 IU/mL    Comment: (NOTE) If heparin results are below expected values, and patient dosage has  been confirmed, suggest follow up testing of antithrombin III levels. Performed at [facility], [address] [place], [place] [address]   Magnesium     Status: Abnormal   Collection Time: 10/31/19  4:44 AM  Result Value Ref Range   Magnesium 1.4 (L) 1.7 - 2.4 mg/dL    Comment: Performed at [facility], [address] [place], [place] [address]  Glucose,  capillary     Status: Abnormal   Collection Time: 10/31/19  6:32 AM  Result Value Ref Range   Glucose-Capillary 102 (H) 70 - 99 mg/dL    Comment: Glucose reference range applies only to samples taken after fasting for at least 8 hours.  Glucose, capillary     Status: Abnormal   Collection Time: 10/31/19  7:37 AM  Result Value Ref Range   Glucose-Capillary 101 (H) 70 - 99 mg/dL    Comment: Glucose reference range applies only to samples taken after fasting for at least 8 hours.  Glucose, capillary     Status: None   Collection Time: 10/31/19 11:33 AM  Result Value Ref Range   Glucose-Capillary 95 70 - 99 mg/dL    Comment: Glucose reference range applies only to samples taken after fasting for at least 8 hours.  Glucose, capillary     Status: Abnormal   Collection Time: 10/31/19  4:43 PM  Result Value Ref Range   Glucose-Capillary 106 (H) 70 - 99 mg/dL    Comment: Glucose reference range applies only to samples taken after fasting for at least 8 hours.  Glucose, capillary     Status: None   Collection Time: 10/31/19  8:11 PM  Result Value Ref Range   Glucose-Capillary 99 70 - 99 mg/dL    Comment: Glucose reference range applies only to samples taken after fasting for at least 8 hours.  Glucose, capillary     Status: None   Collection Time: 11/01/19 12:42 AM  Result Value Ref Range   Glucose-Capillary 90 70 - 99 mg/dL    Comment: Glucose reference range applies only to samples taken after fasting for at least 8 hours.  CBC     Status: Abnormal   Collection Time: 11/01/19  6:25 AM  Result Value Ref Range   WBC 9.9 4.0 - 10.5 K/uL   RBC 3.59 (L) 4.22 - 5.81 MIL/uL   Hemoglobin 10.1 (L) 13.0 - 17.0 g/dL   HCT 97.3 (L) 53.2 - 99.2 %   MCV 91.1 80.0 - 100.0 fL   MCH 28.1 26.0 - 34.0 pg   MCHC 30.9 30.0 - 36.0 g/dL   RDW 42.6 (H) 83.4 - 19.6 %   Platelets 311 150 - 400 K/uL   nRBC 0.0 0.0 - 0.2 %    Comment: Performed at [facility], [address] [place], [place] [address]   Magnesium     Status: Abnormal   Collection Time: 11/01/19  6:25 AM  Result Value Ref Range   Magnesium 1.6 (L) 1.7 - 2.4 mg/dL    Comment: Performed at [facility], [address], [place], [place] [address]  Heparin level (unfractionated)     Status: None   Collection Time: 11/01/19  6:25 AM  Result Value Ref Range   Heparin Unfractionated 0.59 0.30 - 0.70 IU/mL    Comment: (NOTE) If heparin results are below expected values, and patient dosage has  been confirmed, suggest follow up testing of antithrombin III levels. Performed at [facility], [address], [place], [place] [address]   Glucose, capillary     Status: None   Collection Time: 11/01/19  6:25 AM  Result Value Ref Range   Glucose-Capillary 96 70 - 99 mg/dL    Comment: Glucose reference range applies only to samples taken after fasting for at least 8 hours.  Renal function panel     Status: Abnormal   Collection Time: 11/01/19  6:25 AM  Result Value Ref Range   Sodium 142 135 - 145 mmol/L   Potassium 3.6 3.5 - 5.1 mmol/L   Chloride 110 98 - 111 mmol/L   CO2 24 22 - 32 mmol/L   Glucose, Bld 103 (H) 70 - 99 mg/dL    Comment: Glucose reference range applies only to samples taken after fasting for at least 8 hours.   BUN 12 6 - 20 mg/dL   Creatinine, Ser 4.69 0.61 - 1.24 mg/dL   Calcium 9.1 8.9 - 62.9 mg/dL   Phosphorus 3.4 2.5 - 4.6 mg/dL   Albumin 2.5 (L) 3.5 - 5.0 g/dL   GFR calc non Af Amer >60 >60 mL/min   GFR calc Af Amer >60 >60 mL/min   Anion gap 8 5 - 15    Comment: Performed at [facility], [address] [place], [place] [address]  Glucose, capillary     Status: None   Collection Time: 11/01/19 12:29 PM  Result Value Ref Range   Glucose-Capillary 90 70 - 99 mg/dL    Comment: Glucose reference range applies only to samples taken after fasting for at least 8 hours.  Glucose, capillary     Status: None   Collection Time: 11/01/19  2:20 PM  Result Value Ref Range    Glucose-Capillary 88 70 - 99 mg/dL    Comment: Glucose reference range applies only to samples taken after fasting for at least 8 hours.    No results found.  Review of Systems  Musculoskeletal: Positive for arthralgias.  All other systems reviewed and are negative.  Blood pressure (!) 148/92, pulse 93, temperature 98.8 F (37.1 C), temperature source Oral, resp. rate 18, height 6' 0.01" (1.829 m), weight (!) 182.1 kg, SpO2 96 %. Physical Exam  Constitutional: He appears well-developed.  HENT:  Head: Normocephalic.  Eyes: Pupils are equal, round, and reactive to light.  Cardiovascular: Normal rate.  Respiratory: Effort normal.  Musculoskeletal:     Cervical back: Normal range of motion.  Neurological: He is alert.  Skin: Skin is warm.  Psychiatric: He has a normal mood and affect.  Examination of the right knee demonstrates intact skin.  Pedal pulses intact.  Negative Homans.  No calf tenderness.  Cannot really extend the leg against gravity.  Palpable defect underneath the patellar tendon is present.  Mild proximal migration of the patella  itself.  Collaterals are stable.  Cruciates are stable on the right-hand side.  Examination of the left knee demonstrates negative Homans.  Soft calf.  He is able to do a straight leg raise on the left but does have some quad tendon tenderness.  Small defect noted off-center from the mid axis of the patella.  Mild effusion is present.  Patellar tendon is intact on the left.  Collateral and cruciate ligaments are stable on the left-hand side.  Pedal pulses palpable bilaterally.  Assessment/Plan: Impression is extensor mechanism disruption in a morbidly obese patient who is disabled.  He does enjoy walking.  Plan on the right-hand side is patellar tendon rupture repair likely using either suture anchors or bone tunnels.  I would also supplement that with semitendinosis graft harvested left attached at the tibia and then brought around through the quad  tendon just to act as a biologic anchor.  The injury is a 16 old.  That is can add to the difficulty.  Risk and benefits are discussed include not limited to infection nerve vessel damage and failure of the repair is potential need for more surgery.  All questions answered.  Regarding the left knee not something that we may need to repair just to make sure that he has full functional extensor mechanism function due to his weight.  He also has meniscal pathology in that knee which we can address arthroscopically.  Patient understands the risk and benefits of that procedure as well.  All questions answered.  Landry Dyke Edy Belt 11/01/2019, 4:31 PM

## 2019-11-01 NOTE — Progress Notes (Signed)
PROGRESS NOTE    Douglas [name]  ZDG:[id number] DOB: 11/15/1970 DOA: 10/20/2019 PCP: Toma Deiters, MD   Brief Narrative:  49 year old gentleman history of diabetes hypertension came to the hospital after mechanical fall which occurred several weeks ago.  Patient was found to have acute renal failure.  And concern for SBO versus ileus.  NG tube was placed. Patient was seen by surgery for ileus.  Recommending continued NG tube decompression. Patient had worsening kidney function, nephrology was consulted for evaluation.  Due to low blood pressures and very little urine output PCCM was consulted for recommendations transferred to the intensive care unit for close observation and likely initiation of CVVHD.  Patient does have history of paroxysmal atrial fibrillation.  Was on Eliquis PTA.  Switched to heparin infusion for now pending possible surgical intervention.  Assessment & Plan:   Active Problems:   Diabetes mellitus without complication (HCC)   CHF (congestive heart failure) (HCC)   Gout   Hypertension   Sleep apnea   Atrial fibrillation (HCC)   Rupture of left quadriceps tendon   Rupture of patellar tendon, right, initial encounter   AKI (acute kidney injury) (HCC)   Knee pain   AKI presumably due to ischemic ATN/hypotension/dehydration, POA - Continue to hold ARB and Lasix; NSAIDs discontinued at admission - CRRT 10/22/2019 through 10/26/2019, improving drastically - Urine output continues to improve drastically, creatinine downtrending appropriately now WNL Lab Results  Component Value Date   CREATININE 0.81 11/01/2019   CREATININE 0.87 10/31/2019   CREATININE 0.84 10/30/2019    Shock multifactorial in the setting of AKI, hypotension, dehydration, ileus,  Questionably concurrent UTI - Currently afebrile, with persistent leukocytosis - Shock criteria resolved with supportive care, IV fluids - Completed ceftriaxone for 7 days, last dose on 10/29/2019  Ileus,  resolved  - Likely related to renal failure; improved - Continue to advance diet as tolerated - Now complaining of loose stools, held laxatives  Hypokalemia/hypomagnesemia -Replace as needed  Paroxysmal atrial fibrillation, improved -Continues in sinus rhythm currently - Continue IV heparin for coverage -CHA2DS2-VASc of 3 -Continue diltiazem, metoprolol  History of chronic systolic heart failure, EF 50 to 55% - Repeat echo poor window given body habitus -Continue metoprolol -Continue to hold home Lasix, losartan for now  Hypertension, essential  - Hydralazine increased, diltiazem, metoprolol - Would be good candidate for entresto as OP  Type 2 diabetes mellitus -Last A1c 6.8 on 10/21/2019 -Continue sliding scale insulin, hypoglycemic protocol, Accu-Cheks  Knee injury with left high-grade quadriceps tear and medial retinacular tear, possible lateral meniscus tear right complete patella tendon rupture -Orthopedics previously following, spoke with on-call PA, will have orthopedics reevaluate patient for possible inpatient procedure versus outpatient follow-up per their expertise -Plan for surgical procedure on 11/01/2019  Morbid obesity -Lifestyle modification advised     DVT prophylaxis: Heparin drip Code Status: Full Family Communication: Discussed with patient  Status is: Inpatient  Dispo: The patient is from: Home              Anticipated d/c is to: Home Vs CIR pending surgical and PT evaluation              Anticipated d/c date is: To be determined, likely 24 hours pending further evaluation with surgery and PT              Patient currently not medically stable for discharge, continues to require IV anticoagulation, ambulatory dysfunction pending surgical clearance/PT   Consultants:   PCCM, nephrology, orthopedics  Procedures:   CRRT 5/8 -10/26/19  Antimicrobials:  Ceftriaxone, completed on 10/29/2019  Subjective: Today, patient denies any new  complaints.  Eagerly awaiting surgery    Objective: Vitals:   11/01/19 0842 11/01/19 0842 11/01/19 0929 11/01/19 1520  BP: (!) 148/92 (!) 148/92    Pulse: 93 93    Resp: 18 18    Temp: 98.8 F (37.1 C) 98.8 F (37.1 C)    TempSrc: Oral Oral    SpO2: 96% 96% 96%   Weight:    (!) 182.1 kg  Height:    6' 0.01" (1.829 m)    Intake/Output Summary (Last 24 hours) at 11/01/2019 1618 Last data filed at 10/31/2019 1700 Gross per 24 hour  Intake 240 ml  Output --  Net 240 ml   Filed Weights   10/27/19 0330 10/31/19 0937 11/01/19 1520  Weight: (!) 181.4 kg (!) 182.1 kg (!) 182.1 kg    Examination:  General: NAD, morbidly obese  Cardiovascular: S1, S2 present  Respiratory: CTAB  Abdomen: Soft, nontender, nondistended, bowel sounds present  Musculoskeletal: No bilateral pedal edema noted  Skin: Normal  Psychiatry: Normal mood   Data Reviewed: I have personally reviewed following labs and imaging studies  CBC: Recent Labs  Lab 10/28/19 0428 10/29/19 0341 10/30/19 0418 10/31/19 0443 11/01/19 0625  WBC 13.3* 13.3* 12.1* 10.9* 9.9  HGB 10.7* 10.5* 10.2* 10.0* 10.1*  HCT 34.3* 33.7* 33.4* 32.7* 32.7*  MCV 89.1 89.2 90.5 91.9 91.1  PLT 322 308 290 287 311   Basic Metabolic Panel: Recent Labs  Lab 10/28/19 0428 10/29/19 0341 10/30/19 0418 10/31/19 0444 11/01/19 0625  NA 142  142 141 143 143 142  K 3.2*  3.3* 3.4* 3.3* 3.3* 3.6  CL 108  108 109 109 112* 110  CO2 24  23 25 23 23 24   GLUCOSE 105*  106* 108* 111* 111* 103*  BUN 26*  25* 24* 20 15 12   CREATININE 0.96  1.02 0.96 0.84 0.87 0.81  CALCIUM 9.4  9.4 9.0 9.0 8.8* 9.1  MG 1.3* 1.2* 1.3* 1.4* 1.6*  PHOS 3.2  3.3 3.7 3.8 3.6 3.4   GFR: Estimated Creatinine Clearance: 184.3 mL/min (by C-G formula based on SCr of 0.81 mg/dL). Liver Function Tests: Recent Labs  Lab 10/28/19 0428 10/29/19 0341 10/30/19 0418 10/31/19 0444 11/01/19 0625  ALBUMIN 2.1*  2.1* 2.2* 2.3* 2.4* 2.5*   No results  for input(s): LIPASE, AMYLASE in the last 168 hours. No results for input(s): AMMONIA in the last 168 hours. Coagulation Profile: No results for input(s): INR, PROTIME in the last 168 hours. Cardiac Enzymes: No results for input(s): CKTOTAL, CKMB, CKMBINDEX, TROPONINI in the last 168 hours. BNP (last 3 results) No results for input(s): PROBNP in the last 8760 hours. HbA1C: No results for input(s): HGBA1C in the last 72 hours. CBG: Recent Labs  Lab 10/31/19 2011 11/01/19 0042 11/01/19 0625 11/01/19 1229 11/01/19 1420  GLUCAP 99 90 96 90 88   Lipid Profile: No results for input(s): CHOL, HDL, LDLCALC, TRIG, CHOLHDL, LDLDIRECT in the last 72 hours. Thyroid Function Tests: No results for input(s): TSH, T4TOTAL, FREET4, T3FREE, THYROIDAB in the last 72 hours. Anemia Panel: No results for input(s): VITAMINB12, FOLATE, FERRITIN, TIBC, IRON, RETICCTPCT in the last 72 hours. Sepsis Labs: No results for input(s): PROCALCITON, LATICACIDVEN in the last 168 hours.  No results found for this or any previous visit (from the past 240 hour(s)).       Radiology Studies: No results  found.      Scheduled Meds: . chlorhexidine  60 mL Topical Once  . [MAR Hold] chlorhexidine  15 mL Mouth Rinse BID  . [MAR Hold] Chlorhexidine Gluconate Cloth  6 each Topical Q0600  . [MAR Hold] diltiazem  60 mg Oral Q6H  . [MAR Hold] hydrALAZINE  75 mg Oral Q6H  . [MAR Hold] insulin aspart  0-9 Units Subcutaneous Q6H  . [MAR Hold] mouth rinse  15 mL Mouth Rinse q12n4p  . [MAR Hold] metoprolol succinate  100 mg Oral Daily  . [MAR Hold] mometasone-formoterol  2 puff Inhalation BID  . [MAR Hold] multivitamin with minerals  1 tablet Oral Daily  . [MAR Hold] pantoprazole  40 mg Oral Daily  . [MAR Hold] potassium chloride  20 mEq Oral TID  . [MAR Hold] Ensure Max Protein  11 oz Oral Daily  . [MAR Hold] sertraline  50 mg Oral Daily  . [MAR Hold] sodium chloride flush  10-40 mL Intracatheter Q12H    Continuous Infusions: .  ceFAZolin (ANCEF) IV    . tranexamic acid       LOS: 12 days    Alma Friendly, MD [facility]  If 7PM-7AM, please contact night-coverage [email or web address]  11/01/2019, 4:18 PM

## 2019-11-01 NOTE — Anesthesia Procedure Notes (Signed)
Procedure Name: Intubation Date/Time: 11/01/2019 5:34 PM Performed by: Adonis Housekeeper, CRNA Pre-anesthesia Checklist: Patient identified, Emergency Drugs available, Patient being monitored and Suction available Patient Re-evaluated:Patient Re-evaluated prior to induction Oxygen Delivery Method: Circle system utilized Preoxygenation: Pre-oxygenation with 100% oxygen Induction Type: IV induction and Rapid sequence Laryngoscope Size: Glidescope and 4 Grade View: Grade I Tube type: Oral Tube size: 8.0 mm Number of attempts: 1 Airway Equipment and Method: Rigid stylet and Video-laryngoscopy Placement Confirmation: ETT inserted through vocal cords under direct vision,  positive ETCO2 and breath sounds checked- equal and bilateral Secured at: 23 cm Tube secured with: Tape Dental Injury: Teeth and Oropharynx as per pre-operative assessment  Comments: Elective glidescope

## 2019-11-02 ENCOUNTER — Encounter: Payer: Self-pay | Admitting: *Deleted

## 2019-11-02 DIAGNOSIS — R140 Abdominal distension (gaseous): Secondary | ICD-10-CM

## 2019-11-02 LAB — RENAL FUNCTION PANEL
Albumin: 2.5 g/dL — ABNORMAL LOW (ref 3.5–5.0)
Anion gap: 8 (ref 5–15)
BUN: 10 mg/dL (ref 6–20)
CO2: 22 mmol/L (ref 22–32)
Calcium: 8.9 mg/dL (ref 8.9–10.3)
Chloride: 109 mmol/L (ref 98–111)
Creatinine, Ser: 0.82 mg/dL (ref 0.61–1.24)
GFR calc Af Amer: >60 mL/min (ref >60)
GFR calc non Af Amer: >60 mL/min (ref >60)
Glucose, Bld: 193 mg/dL — ABNORMAL HIGH (ref 70–99)
Phosphorus: 4.1 mg/dL (ref 2.5–4.6)
Potassium: 3.9 mmol/L (ref 3.5–5.1)
Sodium: 139 mmol/L (ref 135–145)

## 2019-11-02 LAB — MAGNESIUM: Magnesium: 1.6 mg/dL — ABNORMAL LOW (ref 1.7–2.4)

## 2019-11-02 LAB — GLUCOSE, CAPILLARY
Glucose-Capillary: 102 mg/dL — ABNORMAL HIGH (ref 70–99)
Glucose-Capillary: 106 mg/dL — ABNORMAL HIGH (ref 70–99)
Glucose-Capillary: 106 mg/dL — ABNORMAL HIGH (ref 70–99)
Glucose-Capillary: 115 mg/dL — ABNORMAL HIGH (ref 70–99)
Glucose-Capillary: 136 mg/dL — ABNORMAL HIGH (ref 70–99)

## 2019-11-02 LAB — HEPARIN LEVEL (UNFRACTIONATED): Heparin Unfractionated: <0.1 IU/mL — ABNORMAL LOW (ref 0.30–0.70)

## 2019-11-02 NOTE — Op Note (Signed)
NAME: Douglas [name], Douglas [name] MEDICAL RECORD EA:[id number] ACCOUNT [id number] DATE OF BIRTH:August 03, 1970 FACILITY: [facility] LOCATION: [facility] PHYSICIAN:Marlie Kuennen Diamantina Providence, MD  OPERATIVE REPORT  DATE OF PROCEDURE:  11/01/2019  PREOPERATIVE DIAGNOSES:   1.  Right chronic patellar tendon rupture. 2.  Left knee chronic quadriceps tendon rupture. 3.  Medial meniscal tear.  POSTOPERATIVE DIAGNOSES:   1.  Right chronic patellar tendon rupture. 2.  Left knee chronic quadriceps tendon rupture. 3.  Medial meniscal tear.  PROCEDURES:   1.  Left knee arthroscopy with partial medial meniscectomy and open quadriceps tendon repair, which was chronic. 2.  Chronic patellar tendon rupture repair on the right hand side.  SURGEON:  Cammy Copa, MD  ASSISTANT:  Karenann Cai, PA-C  INDICATIONS:  This is a 48 year old patient who fell over a month ago and injured both knees.  MRI scan showed patellar tendon rupture on the right and quad tendon rupture on the left with medial meniscal tear, presents now for operative management after  explanation of risks and benefits.  PROCEDURE IN DETAIL:  The patient was brought to the operating room where a general anesthetic was induced.  Preoperative antibiotics administered.  Timeout was called.  Left leg was prescrubbed with hydrogen peroxide and alcohol, which was allowed to  air dry, then prepped with ChloraPrep solution and draped in a sterile manner.  Ioban used to cover the operative field after performing arthroscopy.  Timeout was called.  Anterior inferolateral portal was anesthetized with Marcaine with epinephrine.   Anterior inferomedial portal was then anesthetized.  Arthroscopy was performed.  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  There was a tear of the medial meniscus with an  unstable component which was debrided back to a stable rim.  The component was posterior.  All  in all, about 60-70% of meniscal volume of posterior horn was involved.  Following meniscal debridement, the ACL and PCL were intact.  The lateral compartment  meniscus was intact and stable.  The patient did have some wear and tear underneath the patella, but the patella itself was not unstable laterally.  Examination under anesthesia of the left knee did demonstrate stable collateral and cruciate ligaments  intact with no posterolateral rotatory instability.  Following arthroscopy and meniscal debridement, instruments were removed.  Portals were closed using 3-0 nylon and then Ioban was used to cover the operative field and the tourniquet was inflated for a  total tourniquet time of an hour.  Incision was made.  The patient had a chronic quadriceps tendon rupture.  The interface was debrided.  The tendon was mobilized.  Some of the lateral part of the quad tendon was still attached on the lateral aspect of  the proximal patella.  Following mobilization of the tendon, 2 Arthrex suture tapes were placed in [place] fashion and they were then placed into a SwiveLock into the medial aspect of the proximal patella.  This gave a nice repair. The side-to-side repair  was then completed using #1 Vicryl suture.  Retinaculum was also repaired using #1 Vicryl suture.  Thorough irrigation was performed.  Tourniquet was released.  Bleeding points encountered controlled using electrocautery.  The skin was then closed using  interrupted inverted 0 Vicryl suture, 2-0 Vicryl suture and a 3-0 Monocryl.  Solution of Marcaine, morphine, clonidine injected into the knee for postop pain relief.  Aquacel dressing placed and knee immobilizer placed.  Next, attention was directed  towards  the right knee.  Incision was made over the patellar tendon after elevating and exsanguinating the leg.  Total tourniquet time 33 minutes at 325 mm on this leg.  The patient had chronic patellar tendon rupture with some retraction of the patella   itself.  Tendon quality was not great.  There was a vertical cleft and the tendon, which was repaired with #1 Vicryl suture.  Next, there was some tissue on the patella itself, which was the proximal anterior portion of the patellar tendon.  Three suture  tapes were placed in [place] fashion on the patellar tendon remnant attached to the tibial tubercle.  The scar tissue was removed from the interface at the margin of the inferior articular surface.  Next, 2 Arthrex SwiveLock holes were drilled.  The 3  suture limbs were placed into the SwiveLock and the tendon was brought down and got within a centimeter of the inferior pole of the patella.  Superior tissue was oversewn and there were 4 sutures placed in mattress fashion that combined the superior  patellar tendon tissue with the inferior patellar tendon tissue.  All in all, a very secure repair was achieved.  Thorough irrigation was performed.  Vancomycin powder also placed in this knee as it was in the other knee.  The skin was then closed using  interrupted inverted 0 Vicryl suture, 2-0 Vicryl suture and a 3-0 Monocryl.  Steri-Strips and Aquacel dressing applied.  Knee immobilizer placed.  The skin edges here were anesthetized of Marcaine, morphine and clonidine without epinephrine and then the  rest was injected into the knee.  All in all, both the extensor mechanisms were repaired.  Knee immobilizer was placed.  The patient will remain nonweightbearing for a period of at least 4 weeks.  Keep the leg straight for 4 weeks.  Luke's assistance was  required at all times for retraction, opening and closing.  His assistance was a medical necessity.  VN/NUANCE  D:11/01/2019 T:11/02/2019 JOB:[id number]

## 2019-11-02 NOTE — Progress Notes (Signed)
Occupational Therapy Treatment Patient Details Name: Douglas [name] MRN: [id number] DOB: 04-17-1971 Today's Date: 11/02/2019    History of present illness 49 yo male who has not walked in four weeks was admitted, noted his pain in knees from a recent fall that led to nonambulatory status.  Pt is now in acute renal failure, had low BP, shock,  and AKI with hypotension, acute renal failure.  Per MRI has L quad tear, medial retinacular tear, R patellar tendon rupture, lateral meniscal tear. s/p left Quadricep tendon repair, left knee and meniscus debridement, right knee patellar tendon repair. PMHx:  CHF, EF 50-55%, PAF, ileus, HTN   OT comments  Pt agreeable and highly motivated to participate in OT/PT session this date. Pt educated on NWB precautions and requirement for left and right KI maintained at all times. Attempted to progress pt into long sitting in preparation for anterior posterior transfer. Pt required modA+2 for long sitting and due to limitations in trunk and pelvic position as well as air mattress, we were unable to further progress this anterior/posterior transfer. Pt reported frustration after attempt stating "I feel like I failed y'all and failed myself". Educated pt that it takes time and he did a great day for first attempt. Pt will continue to benefit from skilled OT services to maximize safety and independence with ADL/IADL and functional mobility. Will continue to follow acutely and progress as tolerated.    Follow Up Recommendations  CIR    Equipment Recommendations  Wheelchair (measurements OT);Hospital bed;Other (comment)(will continue to assess)    Recommendations for Other Services      Precautions / Restrictions Precautions Precautions: Fall Required Braces or Orthoses: Knee Immobilizer - Right;Knee Immobilizer - Left Knee Immobilizer - Right: On at all times Knee Immobilizer - Left: On at all times Restrictions Weight Bearing Restrictions: Yes RLE Weight  Bearing: Non weight bearing LLE Weight Bearing: Non weight bearing       Mobility Bed Mobility Overal bed mobility: Needs Assistance Bed Mobility: Supine to Sit;Sit to Supine     Supine to sit: Mod assist Sit to supine: Mod assist   General bed mobility comments: modA this session due to attempt to progress into long sitting in preparation for A/P transfer;pt unable to reach anterior pelvic tilt with forward trunk flexion necessary for anterior/posterior transfer  Transfers Overall transfer level: Needs assistance               General transfer comment: attempted anterior posterior transfer, pt unable to reach anterior pelvic tilt and trunk forward flexion necessary for transfer;air mattress limiting level of assistance therapists able to provide    Balance Overall balance assessment: Needs assistance;History of Falls Sitting-balance support: Feet supported;Single extremity supported Sitting balance-Leahy Scale: Poor Sitting balance - Comments: heavy reliance on BUE support on bed rails to maintain sitting in long sitting while EOB                                   ADL either performed or assessed with clinical judgement   ADL Overall ADL's : Needs assistance/impaired                     Lower Body Dressing: Maximal assistance     Toilet Transfer Details (indicate cue type and reason): deferred           General ADL Comments: increased doE sitting EOB, provided EC strategy handout and  began education on [facility] strategies      Vision       Perception     Praxis      Cognition Arousal/Alertness: Awake/alert Behavior During Therapy: WFL for tasks assessed/performed Overall Cognitive Status: Within Functional Limits for tasks assessed                                 General Comments: pt is highly motivated to work with therapy        Exercises     Shoulder Instructions       General Comments      Pertinent  Vitals/ Pain       Pain Assessment: 0-10 Pain Score: 7  Pain Location: bilateral knees Pain Descriptors / Indicators: Guarding;Grimacing;Sore Pain Intervention(s): Limited activity within patient's tolerance;Monitored during session  Home Living                                          Prior Functioning/Environment              Frequency  Min 2X/week        Progress Toward Goals  OT Goals(current goals can now be found in the care plan section)  Progress towards OT goals: Progressing toward goals  Acute Rehab OT Goals Patient Stated Goal: to get home and able to walk OT Goal Formulation: With patient Time For Goal Achievement: 11/14/19 Potential to Achieve Goals: Good ADL Goals Pt Will Perform Grooming: standing;with modified independence;sitting Pt Will Perform Lower Body Dressing: with modified independence;sit to/from stand Pt Will Transfer to Toilet: with modified independence;ambulating  Plan Discharge plan remains appropriate    Co-evaluation                 AM-PAC OT "6 Clicks" Daily Activity     Outcome Measure   Help from another person eating meals?: A Little Help from another person taking care of personal grooming?: A Little Help from another person toileting, which includes using toliet, bedpan, or urinal?: A Lot Help from another person bathing (including washing, rinsing, drying)?: A Lot Help from another person to put on and taking off regular upper body clothing?: A Little Help from another person to put on and taking off regular lower body clothing?: A Lot 6 Click Score: 15    End of Session Equipment Utilized During Treatment: Right knee immobilizer;Left knee immobilizer  OT Visit Diagnosis: Other abnormalities of gait and mobility (R26.89);History of falling (Z91.81);Muscle weakness (generalized) (M62.81)   Activity Tolerance Patient tolerated treatment well   Patient Left in bed;with call bell/phone within  reach   Nurse Communication Mobility status        Time: 6962-9528 OT Time Calculation (min): 27 min  Charges: OT General Charges $OT Visit: 1 Visit OT Treatments $Self Care/Home Management : 8-22 mins  Helene Kelp OTR/L Acute Rehabilitation Services Office: [place] 11/02/2019, 1:12 PM

## 2019-11-02 NOTE — Progress Notes (Addendum)
Inpatient Rehabilitation-Admissions Coordinator   I met with patient and his wife at the bedside for further rehabilitation assessment post surgery. Given pt's PLOF, current medical workup, motivation, and support system at DC, feel pt is an appropriate candidate for IP Rehab. Discussed rehab program, anticipated LOS, goals in rehab, and recommended DC support. I have confirmed DC support with pt's wife at the bedside.   Will need to see greater tolerance with OOB activity prior to beginning insurance authorization process as this will give pt the greatest chance for approval.   Will update once insurance authorization process has been initiated.   Raechel Ache, OTR/L  Rehab Admissions Coordinator  [phone] 11/02/2019 4:34 PM

## 2019-11-02 NOTE — Anesthesia Postprocedure Evaluation (Signed)
Anesthesia Post Note  Patient: Douglas [name]  Procedure(s) Performed: QUADRICEP TENDON REPAIR (Left Knee) ARTHROSCOPY KNEE AND MENISCUS DEBRIDEMENT (Left Knee) KNEE ARTHROSCOPY WITH PATELLAR TENDON REPAIR (Right Knee)     Patient location during evaluation: PACU Anesthesia Type: General Level of consciousness: sedated and patient cooperative Pain management: pain level controlled Vital Signs Assessment: post-procedure vital signs reviewed and stable Respiratory status: spontaneous breathing Cardiovascular status: stable Anesthetic complications: no    Last Vitals:  Vitals:   11/02/19 0153 11/02/19 0255  BP: (!) 151/95 (!) 141/94  Pulse: (!) 102 (!) 105  Resp: 16 16  Temp: 36.6 C 36.8 C  SpO2: 98% 99%    Last Pain:  Vitals:   11/02/19 0255  TempSrc: Oral  PainSc:                  [name]

## 2019-11-02 NOTE — Progress Notes (Signed)
[facility] PROGRESS NOTE    Progress Note  Douglas [name]  KXF:[id number] DOB: 27-Jan-1971 DOA: 10/20/2019 PCP: Neale Burly, MD     Brief Narrative:   Douglas [name] is an 49 y.o. male past medical history of diabetes mellitus, essential hypertension, paroxysmal atrial fibrillation on Eliquis who presents to the hospital after mechanical fall which occurred several weeks ago in the ED he was found to have an SBO and in acute renal failure NG tube was placed was seen by surgery for suspicious of ileus which is now resolved.  [facility] was consulted and due to low blood pressure and poor urine output PCCM was also consulted patient was transferred to the intensive care for observation and likely transition to CVVH.  Transition to IV heparin.  Assessment/Plan:   ATN due to hypotension present on admission: ARB diuretics and NSAIDs were discontinued. CRRT on 10/22/2019 through 10/26/2019, his creatinine has improved drastically as well as his urine output. And his creatinine has improved to baseline.  Shock multifactorial in the setting of hypotension/questionable UTI: He completed 7-day course of IV antibiotics last dose on 10/29/2019, shock criteria resolved with supportive care and IV fluids.  Ileus: Resolved in the setting of renal failure tolerating his diet, he was on laxative and now complaining of loose stools these were discontinued.  Hypokalemia/hypomagnesemia: Repleted now resolved.  Paroxysmal atrial fibrillation: With a chads vas score greater than 2, continue diltiazem and metoprolol IV heparin.  Chronic diastolic heart failure: Continue metoprolol, holding Lasix, losartan restarted on 11/01/2019, blood pressure to kidney function tolerating it.  Essential hypertension: Continue hydralazine diltiazem and metoprolol.  Diabetes mellitus type 2: With an A1c of 6.8 on 10/21/2019: Continue sliding scale insulin.  Knee injury with left  high-grade quadriceps tear and medial with articular tear and possible lateral meniscus tear with right complete patella tendon rupture: Orthopedic surgery was consulted and she status post quadricep tendon repair and arthroscopic knee and meniscus debridement on the left and chronic patellar tendon repair on 11/01/2019. Orthopedic surgery to dictate when patient can bear weight.  Morbid obesity: Lifestyle modification.   DVT prophylaxis: heparin Family Communication:none Status is: Inpatient  Remains inpatient appropriate because:IV treatments appropriate due to intensity of illness or inability to take PO   Dispo: The patient is from: Home              Anticipated d/c is to: SNF              Anticipated d/c date is: 2 days              Patient currently is not medically stable to d/c.   Code Status:     Code Status Orders  (From admission, onward)         Start     Ordered   10/22/19 1502  Full code  Continuous     10/22/19 1503        Code Status History    Date Active Date Inactive Code Status Order ID Comments User Context   10/21/2019 0011 10/22/2019 1503 Full Code [id number]  Nicolette Bang, DO ED   Advance Care Planning Activity        IV Access:    Peripheral IV   Procedures and diagnostic studies:   No results found.   Medical Consultants:    None.  Anti-Infectives:   none  Subjective:    Douglas [name] he relates pain is controlled no further complaints.  Objective:  Vitals:   11/02/19 0058 11/02/19 0153 11/02/19 0255 11/02/19 0745  BP: (!) 177/106 (!) 151/95 (!) 141/94 116/78  Pulse: 93 (!) 102 (!) 105 99  Resp: 16 16 16 18   Temp: 97.7 F (36.5 C) 97.9 F (36.6 C) 98.3 F (36.8 C) 98.7 F (37.1 C)  TempSrc: Oral Oral Oral Oral  SpO2: 99% 98% 99% 96%  Weight:      Height:       SpO2: 96 % O2 Flow Rate (L/min): 2 L/min FiO2 (%): 28 %   Intake/Output Summary (Last 24 hours) at 11/02/2019 1029 Last data  filed at 11/02/2019 0900 Gross per 24 hour  Intake 2165 ml  Output 600 ml  Net 1565 ml   Filed Weights   10/27/19 0330 10/31/19 0937 11/01/19 1520  Weight: (!) 181.4 kg (!) 182.1 kg (!) 182.1 kg    Exam: General exam: In no acute distress, morbidly obese Respiratory system: Good air movement and clear to auscultation. Cardiovascular system: S1 & S2 heard, RRR. No JVD. Gastrointestinal system: Abdomen is nondistended, soft and nontender.  Extremities: No pedal edema. Skin: No rashes, lesions or ulcers  Data Reviewed:    Labs: Basic Metabolic Panel: Recent Labs  Lab 10/29/19 0341 10/29/19 0341 10/30/19 0418 10/30/19 0418 10/31/19 0444 10/31/19 0444 11/01/19 0625 11/02/19 0259  NA 141  --  143  --  143  --  142 139  K 3.4*   < > 3.3*   < > 3.3*   < > 3.6 3.9  CL 109  --  109  --  112*  --  110 109  CO2 25  --  23  --  23  --  24 22  GLUCOSE 108*  --  111*  --  111*  --  103* 193*  BUN 24*  --  20  --  15  --  12 10  CREATININE 0.96  --  0.84  --  0.87  --  0.81 0.82  CALCIUM 9.0  --  9.0  --  8.8*  --  9.1 8.9  MG 1.2*  --  1.3*  --  1.4*  --  1.6* 1.6*  PHOS 3.7  --  3.8  --  3.6  --  3.4 4.1   < > = values in this interval not displayed.   GFR Estimated Creatinine Clearance: 182 mL/min (by C-G formula based on SCr of 0.82 mg/dL). Liver Function Tests: Recent Labs  Lab 10/29/19 0341 10/30/19 0418 10/31/19 0444 11/01/19 0625 11/02/19 0259  ALBUMIN 2.2* 2.3* 2.4* 2.5* 2.5*   No results for input(s): LIPASE, AMYLASE in the last 168 hours. No results for input(s): AMMONIA in the last 168 hours. Coagulation profile No results for input(s): INR, PROTIME in the last 168 hours. COVID-19 Labs  No results for input(s): DDIMER, FERRITIN, LDH, CRP in the last 72 hours.  Lab Results  Component Value Date   SARSCOV2NAA NEGATIVE 10/20/2019   SARSCOV2NAA Not Detected 04/15/2019    CBC: Recent Labs  Lab 10/28/19 0428 10/29/19 0341 10/30/19 0418 10/31/19 0443  11/01/19 0625  WBC 13.3* 13.3* 12.1* 10.9* 9.9  HGB 10.7* 10.5* 10.2* 10.0* 10.1*  HCT 34.3* 33.7* 33.4* 32.7* 32.7*  MCV 89.1 89.2 90.5 91.9 91.1  PLT 322 308 290 287 311   Cardiac Enzymes: No results for input(s): CKTOTAL, CKMB, CKMBINDEX, TROPONINI in the last 168 hours. BNP (last 3 results) No results for input(s): PROBNP in the last 8760 hours. CBG: Recent Labs  Lab 11/01/19 0625 11/01/19 1229 11/01/19 1420 11/02/19 0008 11/02/19 0551  GLUCAP 96 90 88 136* 115*   D-Dimer: No results for input(s): DDIMER in the last 72 hours. Hgb A1c: No results for input(s): HGBA1C in the last 72 hours. Lipid Profile: No results for input(s): CHOL, HDL, LDLCALC, TRIG, CHOLHDL, LDLDIRECT in the last 72 hours. Thyroid function studies: No results for input(s): TSH, T4TOTAL, T3FREE, THYROIDAB in the last 72 hours.  Invalid input(s): FREET3 Anemia work up: No results for input(s): VITAMINB12, FOLATE, FERRITIN, TIBC, IRON, RETICCTPCT in the last 72 hours. Sepsis Labs: Recent Labs  Lab 10/29/19 0341 10/30/19 0418 10/31/19 0443 11/01/19 0625  WBC 13.3* 12.1* 10.9* 9.9   Microbiology No results found for this or any previous visit (from the past 240 hour(s)).   Medications:   . acetaminophen  1,000 mg Oral Q6H  . apixaban  5 mg Oral BID  . chlorhexidine  15 mL Mouth Rinse BID  . Chlorhexidine Gluconate Cloth  6 each Topical Q0600  . diltiazem  60 mg Oral Q6H  . docusate sodium  100 mg Oral BID  . hydrALAZINE  75 mg Oral Q6H  . HYDROmorphone      . HYDROmorphone      . insulin aspart  0-9 Units Subcutaneous Q6H  . mouth rinse  15 mL Mouth Rinse q12n4p  . metoprolol succinate  100 mg Oral Daily  . mometasone-formoterol  2 puff Inhalation BID  . multivitamin with minerals  1 tablet Oral Daily  . oxyCODONE      . pantoprazole  40 mg Oral Daily  . potassium chloride  20 mEq Oral TID  . Ensure Max Protein  11 oz Oral Daily  . sertraline  50 mg Oral Daily  . sodium chloride  flush  10-40 mL Intracatheter Q12H   Continuous Infusions:    LOS: 13 days   Marinda Elk  [facility]  11/02/2019, 10:29 AM

## 2019-11-02 NOTE — Evaluation (Signed)
Physical Therapy Re-Evaluation Patient Details Name: Douglas [name] MRN: [id number] DOB: 03/30/1971 Today's Date: 11/02/2019   History of Present Illness  49 yo male who has not walked in four weeks was admitted, noted his pain in knees from a recent fall that led to nonambulatory status.  Pt is now in acute renal failure, had low BP, shock,  and AKI with hypotension, acute renal failure.  Per MRI has L quad tear, medial retinacular tear, R patellar tendon rupture, lateral meniscal tear. s/p left Quadricep tendon repair, left knee and meniscus debridement, right knee patellar tendon repair. PMHx:  CHF, EF 50-55%, PAF, ileus, HTN  Clinical Impression  Pt re-evaluated s/p procedures listed above. Prior to admission, pt was independent, enjoys playing with his two grandchildren (2 and 46 y.o.). He is highly motivated to participate in therapy. Education provided regarding nonweightbearing status and utilization of bilateral knee immobilizers at all times. Visual demonstration regarding various types of transfers. Attempted set up for anterior posterior transfer; pt requiring two person moderate assist to progress to long sitting position. Overall, limited by trunk/pelvic position and air mattress. Will continue to follow acutely to progress strengthening, bed mobility and transfer training. Recommending CIR to address deficits, decrease caregiver burden and maximize functional mobility.     Follow Up Recommendations CIR    Equipment Recommendations  Other (comment)(defer)    Recommendations for Other Services       Precautions / Restrictions Precautions Precautions: Fall Required Braces or Orthoses: Knee Immobilizer - Right;Knee Immobilizer - Left Knee Immobilizer - Right: On at all times Knee Immobilizer - Left: On at all times Restrictions Weight Bearing Restrictions: Yes RLE Weight Bearing: Non weight bearing LLE Weight Bearing: Non weight bearing      Mobility  Bed  Mobility Overal bed mobility: Needs Assistance Bed Mobility: Supine to Sit;Sit to Supine     Supine to sit: Mod assist Sit to supine: Mod assist   General bed mobility comments: modA this session due to attempt to progress into long sitting in preparation for A/P transfer;pt unable to reach anterior pelvic tilt with forward trunk flexion necessary for anterior/posterior transfer  Transfers Overall transfer level: Needs assistance               General transfer comment: attempted anterior posterior transfer, pt unable to reach anterior pelvic tilt and trunk forward flexion necessary for transfer;air mattress limiting level of assistance therapists able to provide  Ambulation/Gait                Stairs            Wheelchair Mobility    Modified Rankin (Stroke Patients Only)       Balance Overall balance assessment: Needs assistance;History of Falls Sitting-balance support: Feet supported;Single extremity supported Sitting balance-Leahy Scale: Poor Sitting balance - Comments: heavy reliance on BUE support on bed rails to maintain sitting in long sitting while EOB                                     Pertinent Vitals/Pain Pain Assessment: 0-10 Pain Score: 7  Pain Location: bilateral knees Pain Descriptors / Indicators: Guarding;Grimacing;Sore Pain Intervention(s): Limited activity within patient's tolerance;Monitored during session;Repositioned    Home Living Family/patient expects to be discharged to:: Private residence Living Arrangements: Spouse/significant other Available Help at Discharge: Family;Available PRN/intermittently Type of Home: House Home Access: Stairs to enter Entrance Stairs-Rails: Can reach  both Entrance Stairs-Number of Steps: 4 Home Layout: One level Home Equipment: None      Prior Function Level of Independence: Independent               Hand Dominance   Dominant Hand: Right    Extremity/Trunk  Assessment   Upper Extremity Assessment Upper Extremity Assessment: Overall WFL for tasks assessed    Lower Extremity Assessment Lower Extremity Assessment: RLE deficits/detail;LLE deficits/detail RLE Deficits / Details: Ankle dorsiflexion/plantarflexion WFL RLE: Unable to fully assess due to immobilization LLE Deficits / Details: same as RLE    Cervical / Trunk Assessment Cervical / Trunk Assessment: Other exceptions Cervical / Trunk Exceptions: increased body habitus  Communication   Communication: No difficulties  Cognition Arousal/Alertness: Awake/alert Behavior During Therapy: WFL for tasks assessed/performed Overall Cognitive Status: Within Functional Limits for tasks assessed                                 General Comments: pt is highly motivated to work with therapy      General Comments      Exercises General Exercises - Lower Extremity Ankle Circles/Pumps: Both;10 reps;Supine   Assessment/Plan    PT Assessment Patient needs continued PT services  PT Problem List Decreased strength;Decreased range of motion;Decreased activity tolerance;Decreased balance;Decreased mobility;Decreased coordination;Decreased knowledge of use of DME;Decreased safety awareness;Cardiopulmonary status limiting activity;Obesity;Pain       PT Treatment Interventions DME instruction;Functional mobility training;Therapeutic activities;Therapeutic exercise;Balance training;Neuromuscular re-education;Patient/family education    PT Goals (Current goals can be found in the Care Plan section)  Acute Rehab PT Goals Patient Stated Goal: to get home and able to walk PT Goal Formulation: With patient Time For Goal Achievement: 11/16/19 Potential to Achieve Goals: Good    Frequency Min 5X/week   Barriers to discharge Inaccessible home environment;Decreased caregiver support      Co-evaluation PT/OT/SLP Co-Evaluation/Treatment: Yes Reason for Co-Treatment: To address  functional/ADL transfers;For patient/therapist safety PT goals addressed during session: Mobility/safety with mobility         AM-PAC PT "6 Clicks" Mobility  Outcome Measure Help needed turning from your back to your side while in a flat bed without using bedrails?: A Lot Help needed moving from lying on your back to sitting on the side of a flat bed without using bedrails?: A Lot Help needed moving to and from a bed to a chair (including a wheelchair)?: Total Help needed standing up from a chair using your arms (e.g., wheelchair or bedside chair)?: Total Help needed to walk in hospital room?: Total Help needed climbing 3-5 steps with a railing? : Total 6 Click Score: 8    End of Session   Activity Tolerance: Patient limited by fatigue Patient left: in bed;with call bell/phone within reach;with bed alarm set Nurse Communication: Mobility status PT Visit Diagnosis: Muscle weakness (generalized) (M62.81);History of falling (Z91.81)    Time: 9242-6834 PT Time Calculation (min) (ACUTE ONLY): 26 min   Charges:   PT Evaluation $PT Re-evaluation: 1 Re-eval            Wyona Almas, PT, DPT Acute Rehabilitation Services Pager [phone] Office [phone]   Deno Etienne 11/02/2019, 3:09 PM

## 2019-11-02 NOTE — Progress Notes (Signed)
  Subjective: Patient is a 49 year old male who presents s/p left knee quadricep tendon repair with right knee patellar tendon repair.  He is postop day 1.  He is resting comfortably in bed with knee immobilizers on both legs.  He denies any significant pain.  He notes soreness.  Denies any numbness or tingling.  He understands is nonweightbearing restrictions on both legs.  Understands he needs to keep both of his legs straight without flexing the knees.  He worked with physical therapy today where they worked on transferring from bed into chair.   Objective: Vital signs in last 24 hours: Temp:  [97.7 F (36.5 C)-98.9 F (37.2 C)] 98.1 F (36.7 C) (05/19 1923) Pulse Rate:  [86-105] 100 (05/19 1923) Resp:  [15-21] 17 (05/19 1923) BP: (116-177)/(77-106) 139/82 (05/19 1923) SpO2:  [94 %-100 %] 95 % (05/19 1923)  Intake/Output from previous day: 05/18 0701 - 05/19 0700 In: 1925 [I.V.:1700] Out: 600 [Urine:450; Blood:150] Intake/Output this shift: No intake/output data recorded.  Exam:  Knee immobilizer bilaterally in place.  2+ DP pulse bilaterally.  Sensation intact throughout the bilateral feet.  Dorsiflexion and plantar flexion are intact bilaterally.  Labs: Recent Labs    10/31/19 0443 11/01/19 0625  HGB 10.0* 10.1*   Recent Labs    10/31/19 0443 11/01/19 0625  WBC 10.9* 9.9  RBC 3.56* 3.59*  HCT 32.7* 32.7*  PLT 287 311   Recent Labs    11/01/19 0625 11/02/19 0259  NA 142 139  K 3.6 3.9  CL 110 109  CO2 24 22  BUN 12 10  CREATININE 0.81 0.82  GLUCOSE 103* 193*  CALCIUM 9.1 8.9   No results for input(s): LABPT, INR in the last 72 hours.  Assessment/Plan:  Patient is postop day 1 s/p quadricep tendon repair and patellar tendon repair.  Doing well overall.  Plan to maintain nonweightbearing status in both legs with knees kept at full extension over the next 4 to 6 weeks.  Follow-up with Dr. August Saucer in the clinic in 7 to 10 days.   Joycie Peek Robb Sibal 11/02/2019,  7:43 PM

## 2019-11-02 NOTE — Progress Notes (Signed)
Nutrition Follow-up  DOCUMENTATION CODES:   Morbid obesity  INTERVENTION:   -Continue double protein portions with meals -Continue Ensure Max po daily, each supplement provides 150 kcal and 30 grams of protein.  -Continue MVI with minerals daily  NUTRITION DIAGNOSIS:   Inadequate oral intake related to altered GI function as evidenced by NPO status.  Progressing; advanced to soft diet on 10/27/19  GOAL:   Patient will meet greater than or equal to 90% of their needs  Progressing   MONITOR:   Diet advancement, Labs, Weight trends, Skin, I & O's  REASON FOR ASSESSMENT:   Malnutrition Screening Tool    ASSESSMENT:   Douglas [name] is 49 y.o. male with PMH of CHF, DM, HTN, gout who presents to ER with bilateral knee pain. Patient has been admitted to [facility] for 4 weeks for knee pain after a fall. He has been unable to bear weight since this injury. Hospital was unable to obtain MRI during this time period. Patient reports x-rays were normal.  5/8- start CRRT 5/12- CRRT d/c 5/14- advanced to soft diet 5/18- s/p Procedure(s): Chronic QUADRICEP TENDON REPAIR; ARTHROSCOPY KNEE AND MENISCUS DEBRIDEMENT left knee; Chronic PATELLAR TENDON REPAIR right knee  Reviewed I/O's: +1.3 L x 24 hours and +3.2 L since admission  UOP: 450 ml x 24 hours  Pt resting quietly at time of visit.   He continues to have good appetite. Meal completion 75-100%. Per MAR, he is taking Ensure Max supplements. Pt with increased nutritional needs secondary to post-operative healing and will require addition of nutritional supplements to meet increased needs.  Obesity is a complex, chronic medical condition that is optimally managed by a multidisciplinary care team. Weight loss is not an ideal goal for an acute inpatient hospitalization. However, if further work-up for obesity is warranted, consider outpatient referral to outpatient bariatric service and/or [place]  [facility].   Medications reviewed and include cardizem and colace.  Labs reviewed: CBGS: 88-136 (inpatient orders for glycemic control are 0-9 units insulin aspart every 6 hours).   NUTRITION - FOCUSED PHYSICAL EXAM:    Most Recent Value  Orbital Region  No depletion  Upper Arm Region  No depletion  Thoracic and Lumbar Region  No depletion  Buccal Region  No depletion  Temple Region  No depletion  Clavicle Bone Region  No depletion  Clavicle and Acromion Bone Region  No depletion  Scapular Bone Region  No depletion  Dorsal Hand  No depletion  Patellar Region  Unable to assess  Anterior Thigh Region  Unable to assess  Posterior Calf Region  Unable to assess  Edema (RD Assessment)  Mild  Hair  Reviewed  Eyes  Reviewed  Mouth  Reviewed  Skin  Reviewed  Nails  Reviewed       Diet Order:   Diet Order            Diet regular Room service appropriate? Yes; Fluid consistency: Thin  Diet effective now              EDUCATION NEEDS:   Not appropriate for education at this time  Skin:  Skin Assessment: Skin Integrity Issues: Skin Integrity Issues:: Other (Comment) Incisions: lt knee, rt leg Other: skin tear to rt and lt buttocks; MASD to back, thigh, scrotum, groin, buttocks  Last BM:  11/01/19  Height:   Ht Readings from Last 1 Encounters:  11/01/19 6' 0.01" (1.829 m)    Weight:   Wt  Readings from Last 1 Encounters:  11/01/19 (!) 182.1 kg    Ideal Body Weight:  80.9 kg  BMI:  Body mass index is 54.44 kg/m.  Estimated Nutritional Needs:   Kcal:  2300-2500 kcal  Protein:  125-160 grams  Fluid:  >/= 2.3 L/day    Loistine Chance, RD, LDN, CDCES Registered Dietitian II Certified Diabetes Care and Education Specialist Please refer to [facility] for RD and/or RD on-call/weekend/after hours pager

## 2019-11-03 LAB — RENAL FUNCTION PANEL
Albumin: 2.4 g/dL — ABNORMAL LOW (ref 3.5–5.0)
Anion gap: 9 (ref 5–15)
BUN: 10 mg/dL (ref 6–20)
CO2: 23 mmol/L (ref 22–32)
Calcium: 8.7 mg/dL — ABNORMAL LOW (ref 8.9–10.3)
Chloride: 110 mmol/L (ref 98–111)
Creatinine, Ser: 0.84 mg/dL (ref 0.61–1.24)
GFR calc Af Amer: >60 mL/min (ref >60)
GFR calc non Af Amer: >60 mL/min (ref >60)
Glucose, Bld: 106 mg/dL — ABNORMAL HIGH (ref 70–99)
Phosphorus: 3.6 mg/dL (ref 2.5–4.6)
Potassium: 4.1 mmol/L (ref 3.5–5.1)
Sodium: 142 mmol/L (ref 135–145)

## 2019-11-03 LAB — GLUCOSE, CAPILLARY
Glucose-Capillary: 97 mg/dL (ref 70–99)
Glucose-Capillary: 113 mg/dL — ABNORMAL HIGH (ref 70–99)
Glucose-Capillary: 101 mg/dL — ABNORMAL HIGH (ref 70–99)
Glucose-Capillary: 86 mg/dL (ref 70–99)

## 2019-11-03 LAB — MAGNESIUM: Magnesium: 1.7 mg/dL (ref 1.7–2.4)

## 2019-11-03 NOTE — Progress Notes (Signed)
[facility] PROGRESS NOTE    Progress Note  Douglas [name]  JKK:[id number] DOB: 01/17/1971 DOA: 10/20/2019 PCP: Toma Deiters, MD     Brief Narrative:   Douglas [name] is an 49 y.o. male past medical history of diabetes mellitus, essential hypertension, paroxysmal atrial fibrillation on Eliquis who presents to the hospital after mechanical fall which occurred several weeks ago in the ED he was found to have an SBO and in acute renal failure NG tube was placed was seen by surgery for suspicious of ileus which is now resolved.  [facility] was consulted and due to low blood pressure and poor urine output PCCM was also consulted patient was transferred to the intensive care for observation and likely transition to CVVH.  Transition to IV heparin.  Assessment/Plan:   ATN due to hypotension present on admission: ARB diuretics and NSAIDs were discontinued. CRRT on 10/22/2019 through 10/26/2019, his creatinine has improved drastically as well as his urine output. And his creatinine has improved to baseline.  Shock multifactorial in the setting of hypotension/questionable UTI: He completed 7-day course of IV antibiotics last dose on 10/29/2019, shock criteria resolved with supportive care and IV fluids.  Ileus: Resolved in the setting of renal failure tolerating his diet, he was on laxative and now complaining of loose stools these were discontinued.  Hypokalemia/hypomagnesemia: Repleted now resolved.  Paroxysmal atrial fibrillation: With a chads vas score greater than 2, continue diltiazem and metoprolol IV heparin.  Chronic diastolic heart failure: Continue metoprolol, holding Lasix, losartan restarted on 11/01/2019, blood pressure to kidney function tolerating it.  Essential hypertension: Continue hydralazine diltiazem and metoprolol.  Diabetes mellitus type 2: With an A1c of 6.8 on 10/21/2019: Continue sliding scale insulin.  Will resume oral hypoglycemic  agents as an outpatient.  Knee injury with left high-grade quadriceps tear and medial with articular tear and possible lateral meniscus tear with right complete patella tendon rupture: Orthopedic surgery was consulted and she status post quadricep tendon repair and arthroscopic knee and meniscus debridement on the left and chronic patellar tendon repair on 11/01/2019. Orthopedic surgery injury recommended nonweightbearing status in both legs, keep full extension of the knee over the next 4 to 6 weeks, follow-up with orthopedic surgery as an outpatient in 7 to 10 days. Physical therapy evaluated the patient recommended inpatient rehab.  Morbid obesity: Lifestyle modification.   DVT prophylaxis: heparin Family Communication:none Status is: Inpatient  Remains inpatient appropriate because:IV treatments appropriate due to intensity of illness or inability to take PO   Dispo: The patient is from: Home              Anticipated d/c is to: SNF              Anticipated d/c date is: 2 days              Patient currently is not medically stable to d/c.   Code Status:     Code Status Orders  (From admission, onward)         Start     Ordered   10/22/19 1502  Full code  Continuous     10/22/19 1503        Code Status History    Date Active Date Inactive Code Status Order ID Comments User Context   10/21/2019 0011 10/22/2019 1503 Full Code [id number]  Arvilla Market, DO ED   Advance Care Planning Activity        IV Access:    Peripheral IV  Procedures and diagnostic studies:   No results found.   Medical Consultants:    None.  Anti-Infectives:   none  Subjective:    Douglas [name] relates his pain is controlled he did had a bowel movement yesterday, relates no constipation.  Objective:    Vitals:   11/02/19 1137 11/02/19 1923 11/03/19 0241 11/03/19 0749  BP: (!) 125/91 139/82 116/79 135/82  Pulse: 86 100 89 96  Resp: 16 17 16 15   Temp:  98.2 F (36.8 C) 98.1 F (36.7 C) (!) 97.5 F (36.4 C) 98.3 F (36.8 C)  TempSrc: Oral Oral Oral Oral  SpO2: 94% 95% 99% 100%  Weight:      Height:       SpO2: 100 % O2 Flow Rate (L/min): 2 L/min FiO2 (%): 28 %   Intake/Output Summary (Last 24 hours) at 11/03/2019 0947 Last data filed at 11/02/2019 1500 Gross per 24 hour  Intake 240 ml  Output -  Net 240 ml   Filed Weights   10/27/19 0330 10/31/19 0937 11/01/19 1520  Weight: (!) 181.4 kg (!) 182.1 kg (!) 182.1 kg    Exam: General exam: In no acute distress. Respiratory system: Good air movement and clear to auscultation. Cardiovascular system: S1 & S2 heard, RRR. No JVD. Gastrointestinal system: Abdomen is nondistended, soft and nontender.  Extremities: No pedal edema. Skin: No rashes, lesions or ulcers  Data Reviewed:    Labs: Basic Metabolic Panel: Recent Labs  Lab 10/30/19 0418 10/30/19 0418 10/31/19 0444 10/31/19 0444 11/01/19 0625 11/01/19 0625 11/02/19 0259 11/03/19 0207  NA 143  --  143  --  142  --  139 142  K 3.3*   < > 3.3*   < > 3.6   < > 3.9 4.1  CL 109  --  112*  --  110  --  109 110  CO2 23  --  23  --  24  --  22 23  GLUCOSE 111*  --  111*  --  103*  --  193* 106*  BUN 20  --  15  --  12  --  10 10  CREATININE 0.84  --  0.87  --  0.81  --  0.82 0.84  CALCIUM 9.0  --  8.8*  --  9.1  --  8.9 8.7*  MG 1.3*  --  1.4*  --  1.6*  --  1.6* 1.7  PHOS 3.8  --  3.6  --  3.4  --  4.1 3.6   < > = values in this interval not displayed.   GFR Estimated Creatinine Clearance: 177.7 mL/min (by C-G formula based on SCr of 0.84 mg/dL). Liver Function Tests: Recent Labs  Lab 10/30/19 0418 10/31/19 0444 11/01/19 0625 11/02/19 0259 11/03/19 0207  ALBUMIN 2.3* 2.4* 2.5* 2.5* 2.4*   No results for input(s): LIPASE, AMYLASE in the last 168 hours. No results for input(s): AMMONIA in the last 168 hours. Coagulation profile No results for input(s): INR, PROTIME in the last 168 hours. COVID-19 Labs  No  results for input(s): DDIMER, FERRITIN, LDH, CRP in the last 72 hours.  Lab Results  Component Value Date   SARSCOV2NAA NEGATIVE 10/20/2019   SARSCOV2NAA Not Detected 04/15/2019    CBC: Recent Labs  Lab 10/28/19 0428 10/29/19 0341 10/30/19 0418 10/31/19 0443 11/01/19 0625  WBC 13.3* 13.3* 12.1* 10.9* 9.9  HGB 10.7* 10.5* 10.2* 10.0* 10.1*  HCT 34.3* 33.7* 33.4* 32.7* 32.7*  MCV 89.1 89.2 90.5  91.9 91.1  PLT 322 308 290 287 311   Cardiac Enzymes: No results for input(s): CKTOTAL, CKMB, CKMBINDEX, TROPONINI in the last 168 hours. BNP (last 3 results) No results for input(s): PROBNP in the last 8760 hours. CBG: Recent Labs  Lab 11/02/19 0551 11/02/19 1131 11/02/19 1734 11/02/19 2351 11/03/19 0550  GLUCAP 115* 106* 102* 106* 97   D-Dimer: No results for input(s): DDIMER in the last 72 hours. Hgb A1c: No results for input(s): HGBA1C in the last 72 hours. Lipid Profile: No results for input(s): CHOL, HDL, LDLCALC, TRIG, CHOLHDL, LDLDIRECT in the last 72 hours. Thyroid function studies: No results for input(s): TSH, T4TOTAL, T3FREE, THYROIDAB in the last 72 hours.  Invalid input(s): FREET3 Anemia work up: No results for input(s): VITAMINB12, FOLATE, FERRITIN, TIBC, IRON, RETICCTPCT in the last 72 hours. Sepsis Labs: Recent Labs  Lab 10/29/19 0341 10/30/19 0418 10/31/19 0443 11/01/19 0625  WBC 13.3* 12.1* 10.9* 9.9   Microbiology No results found for this or any previous visit (from the past 240 hour(s)).   Medications:   . apixaban  5 mg Oral BID  . chlorhexidine  15 mL Mouth Rinse BID  . Chlorhexidine Gluconate Cloth  6 each Topical Q0600  . diltiazem  60 mg Oral Q6H  . docusate sodium  100 mg Oral BID  . hydrALAZINE  75 mg Oral Q6H  . insulin aspart  0-9 Units Subcutaneous Q6H  . mouth rinse  15 mL Mouth Rinse q12n4p  . metoprolol succinate  100 mg Oral Daily  . mometasone-formoterol  2 puff Inhalation BID  . multivitamin with minerals  1 tablet  Oral Daily  . pantoprazole  40 mg Oral Daily  . potassium chloride  20 mEq Oral TID  . Ensure Max Protein  11 oz Oral Daily  . sertraline  50 mg Oral Daily  . sodium chloride flush  10-40 mL Intracatheter Q12H   Continuous Infusions:    LOS: 14 days   Charlynne Cousins  [facility]  11/03/2019, 9:47 AM

## 2019-11-03 NOTE — Progress Notes (Signed)
Patient stable.  Pain improving.  Plan to keep the leg straight with Bledsoe braces bilaterally.  Okay for discharge to rehab when ready.

## 2019-11-03 NOTE — Discharge Instructions (Signed)

## 2019-11-03 NOTE — Progress Notes (Signed)
@  2035 Dr. Bruna Potter notified. CCMD called and stated pt had 10 runs of VTach. Pt assessed, pt stable and resting well with no complaints. Will continue to monitor.

## 2019-11-03 NOTE — Plan of Care (Signed)
  Problem: Education: Goal: Knowledge of General Education information will improve Description: Including pain rating scale, medication(s)/side effects and non-pharmacologic comfort measures Outcome: Progressing   Problem: Pain Managment: Goal: General experience of comfort will improve Outcome: Progressing   Problem: Skin Integrity: Goal: Risk for impaired skin integrity will decrease Outcome: Progressing   

## 2019-11-03 NOTE — Progress Notes (Signed)
Physical Therapy Treatment Patient Details Name: Douglas [name] MRN: [id number] DOB: 1970/07/12 Today's Date: 11/03/2019    History of Present Illness 49 yo male who has not walked in four weeks was admitted, noted his pain in knees from a recent fall that led to nonambulatory status.  Pt is now in acute renal failure, had low BP, shock,  and AKI with hypotension, acute renal failure.  Per MRI has L quad tear, medial retinacular tear, R patellar tendon rupture, lateral meniscal tear. s/p left Quadricep tendon repair, left knee and meniscus debridement, right knee patellar tendon repair. PMHx:  CHF, EF 50-55%, PAF, ileus, HTN    PT Comments    Pt supine in bed, he is very motivated to mobilize at this time and frustrated at his progress.  Focused on long sitting and rolling.  PTA informed AD of inadequate equipment and she is working to locate what he needs.  PTA also looking into necessity of air mattress as it is hindering his mobility.  Continue to recommend rehab in a post acute setting.      Follow Up Recommendations  CIR     Equipment Recommendations  (defer)    Recommendations for Other Services Rehab consult     Precautions / Restrictions Precautions Precautions: Fall Required Braces or Orthoses: Knee Immobilizer - Right;Knee Immobilizer - Left Knee Immobilizer - Right: On at all times Knee Immobilizer - Left: On at all times Restrictions Weight Bearing Restrictions: Yes RLE Weight Bearing: Non weight bearing LLE Weight Bearing: Non weight bearing Other Position/Activity Restrictions: kept pt NWB through BLE     Mobility  Bed Mobility Overal bed mobility: Needs Assistance Bed Mobility: Rolling;Supine to Sit;Sit to Supine     Supine to sit: Mod assist;+2 for physical assistance Sit to supine: Mod assist;+2 for safety/equipment   General bed mobility comments: Pt remains only able to perform long sitting and rolling.  He did tolerate sitting 5 min in long  sitting with heavy upper extremity support holding to bed rails.  Pt able to follow commands for hand placement to roll modified side to side for pressure relief.  Transfers Overall transfer level: (unable to at this time recommend maximove for OOB.)                  Ambulation/Gait                 Stairs             Wheelchair Mobility    Modified Rankin (Stroke Patients Only)       Balance Overall balance assessment: Needs assistance;History of Falls Sitting-balance support: Feet supported;Single extremity supported Sitting balance-Leahy Scale: Poor Sitting balance - Comments: heavy reliance on BUE support on bed rails to maintain sitting in long sitting in bed.                                    Cognition Arousal/Alertness: Awake/alert Behavior During Therapy: WFL for tasks assessed/performed Overall Cognitive Status: Within Functional Limits for tasks assessed                                 General Comments: pt is highly motivated to work with therapy      Exercises      General Comments        Pertinent Vitals/Pain Pain Assessment:  0-10 Faces Pain Scale: Hurts little more Pain Location: bilateral knees Pain Descriptors / Indicators: Guarding;Grimacing;Sore Pain Intervention(s): Monitored during session;Repositioned    Home Living                      Prior Function            PT Goals (current goals can now be found in the care plan section) Acute Rehab PT Goals Patient Stated Goal: To go to rehab and get stronger. Potential to Achieve Goals: Good Progress towards PT goals: Progressing toward goals    Frequency    Min 5X/week      PT Plan Current plan remains appropriate    Co-evaluation              AM-PAC PT "6 Clicks" Mobility   Outcome Measure  Help needed turning from your back to your side while in a flat bed without using bedrails?: A Lot Help needed moving from  lying on your back to sitting on the side of a flat bed without using bedrails?: A Lot Help needed moving to and from a bed to a chair (including a wheelchair)?: Total Help needed standing up from a chair using your arms (e.g., wheelchair or bedside chair)?: Total Help needed to walk in hospital room?: Total Help needed climbing 3-5 steps with a railing? : Total 6 Click Score: 8    End of Session Equipment Utilized During Treatment: Gait belt Activity Tolerance: Patient limited by fatigue Patient left: in bed;with call bell/phone within reach;with bed alarm set Nurse Communication: Mobility status PT Visit Diagnosis: Muscle weakness (generalized) (M62.81);History of falling (Z91.81)     Time: 1191-4782 PT Time Calculation (min) (ACUTE ONLY): 24 min  Charges:  $Therapeutic Activity: 23-37 mins                     Erasmo Leventhal , PTA Acute Rehabilitation Services Pager [phone] Office [phone]     Douglas [name] 11/03/2019, 3:08 PM

## 2019-11-04 LAB — GLUCOSE, CAPILLARY
Glucose-Capillary: 88 mg/dL (ref 70–99)
Glucose-Capillary: 104 mg/dL — ABNORMAL HIGH (ref 70–99)
Glucose-Capillary: 90 mg/dL (ref 70–99)

## 2019-11-04 LAB — BASIC METABOLIC PANEL
Anion gap: 9 (ref 5–15)
BUN: 8 mg/dL (ref 6–20)
CO2: 24 mmol/L (ref 22–32)
Calcium: 8.8 mg/dL — ABNORMAL LOW (ref 8.9–10.3)
Chloride: 107 mmol/L (ref 98–111)
Creatinine, Ser: 0.73 mg/dL (ref 0.61–1.24)
GFR calc Af Amer: >60 mL/min (ref >60)
GFR calc non Af Amer: >60 mL/min (ref >60)
Glucose, Bld: 96 mg/dL (ref 70–99)
Potassium: 4.1 mmol/L (ref 3.5–5.1)
Sodium: 140 mmol/L (ref 135–145)

## 2019-11-04 LAB — MAGNESIUM: Magnesium: 1.6 mg/dL — ABNORMAL LOW (ref 1.7–2.4)

## 2019-11-04 MED ORDER — DILTIAZEM HCL ER COATED BEADS 180 MG PO CP24
360.0000 mg | ORAL_CAPSULE | Freq: Every day | ORAL | Status: DC
  Administered 2019-11-04 – 2019-11-09 (×6): 360 mg via ORAL
  Filled 2019-11-04 (×6): qty 2

## 2019-11-04 MED ORDER — ATORVASTATIN CALCIUM 10 MG PO TABS
20.0000 mg | ORAL_TABLET | Freq: Every day | ORAL | Status: DC
  Administered 2019-11-04 – 2019-11-09 (×6): 20 mg via ORAL
  Filled 2019-11-04 (×6): qty 2

## 2019-11-04 MED ORDER — MAGNESIUM OXIDE 400 (241.3 MG) MG PO TABS
400.0000 mg | ORAL_TABLET | Freq: Two times a day (BID) | ORAL | Status: AC
  Administered 2019-11-04 (×2): 400 mg via ORAL
  Filled 2019-11-04 (×2): qty 1

## 2019-11-04 MED ORDER — FUROSEMIDE 40 MG PO TABS
40.0000 mg | ORAL_TABLET | Freq: Every day | ORAL | Status: DC
  Administered 2019-11-04 – 2019-11-09 (×6): 40 mg via ORAL
  Filled 2019-11-04 (×6): qty 1

## 2019-11-04 MED ORDER — METOPROLOL SUCCINATE ER 50 MG PO TB24: 100.0000 mg | ORAL_TABLET | Freq: Every day | ORAL | Status: DC

## 2019-11-04 NOTE — Plan of Care (Signed)
  Problem: Education: Goal: Knowledge of General Education information will improve Description: Including pain rating scale, medication(s)/side effects and non-pharmacologic comfort measures Outcome: Progressing   Problem: Health Behavior/Discharge Planning: Goal: Ability to manage health-related needs will improve Outcome: Progressing   Problem: Activity: Goal: Risk for activity intolerance will decrease Outcome: Progressing   Problem: Elimination: Goal: Will not experience complications related to bowel motility Outcome: Progressing   Problem: Pain Managment: Goal: General experience of comfort will improve Outcome: Progressing   

## 2019-11-04 NOTE — Plan of Care (Signed)
  Problem: Skin Integrity: Goal: Risk for impaired skin integrity will decrease Outcome: Progressing   Problem: Activity: Goal: Risk for activity intolerance will decrease Outcome: Progressing   Problem: Education: Goal: Knowledge of General Education information will improve Description: Including pain rating scale, medication(s)/side effects and non-pharmacologic comfort measures Outcome: Progressing

## 2019-11-04 NOTE — Progress Notes (Signed)
Physical Therapy Treatment Patient Details Name: Douglas [name] MRN: [id number] DOB: August 21, 1970 Today's Date: 11/04/2019    History of Present Illness 49 yo male who has not walked in four weeks was admitted, noted his pain in knees from a recent fall that led to nonambulatory status.  Pt is now in acute renal failure, had low BP, shock,  and AKI with hypotension, acute renal failure.  Per MRI has L quad tear, medial retinacular tear, R patellar tendon rupture, lateral meniscal tear. s/p left Quadricep tendon repair, left knee and meniscus debridement, right knee patellar tendon repair. PMHx:  CHF, EF 50-55%, PAF, ileus, HTN    PT Comments    Pt reports no pain at rest, but does have generalized discomfort and soreness in bilateral knees with mobility. Pt able to progress out of bed to recliner via maxi move lift today. Once up in recliner, able to initiate education and instruction regarding reciprocal scooting and head/hip relationship for pre transfer training. Pt remains highly motivated to participate and was excited to get out of the bed. Continue to recommend post acute rehab to address deficits, maximize functional mobility and decrease caregiver burden.    Follow Up Recommendations  CIR     Equipment Recommendations  Other (comment)(defer)    Recommendations for Other Services       Precautions / Restrictions Precautions Precautions: Fall Required Braces or Orthoses: Knee Immobilizer - Right;Knee Immobilizer - Left Knee Immobilizer - Right: On at all times Knee Immobilizer - Left: On at all times Restrictions Weight Bearing Restrictions: Yes RLE Weight Bearing: Non weight bearing LLE Weight Bearing: Non weight bearing    Mobility  Bed Mobility Overal bed mobility: Needs Assistance Bed Mobility: Rolling           General bed mobility comments: Rolling to right and left for placement of maxi move pad with minA for contralateral leg management  Transfers                     Ambulation/Gait                 Stairs             Wheelchair Mobility    Modified Rankin (Stroke Patients Only)       Balance                                            Cognition Arousal/Alertness: Awake/alert Behavior During Therapy: WFL for tasks assessed/performed Overall Cognitive Status: Within Functional Limits for tasks assessed                                        Exercises General Exercises - Lower Extremity Ankle Circles/Pumps: Both;15 reps;Supine Other Exercises Other Exercises: Sitting in recliner: education/instruction on reciprocal scooting, head/hip relationship, anterior lean with pulling on chair arms for core and trunk activation    General Comments        Pertinent Vitals/Pain Pain Assessment: Faces Faces Pain Scale: Hurts even more Pain Location: No pain at rest; grimacing with certain aspects of movement Pain Descriptors / Indicators: Guarding;Grimacing;Sore Pain Intervention(s): Monitored during session;Repositioned    Home Living  Prior Function            PT Goals (current goals can now be found in the care plan section) Acute Rehab PT Goals Patient Stated Goal: To go to rehab and get stronger. Potential to Achieve Goals: Good Progress towards PT goals: Progressing toward goals    Frequency    Min 5X/week      PT Plan Current plan remains appropriate    Co-evaluation PT/OT/SLP Co-Evaluation/Treatment: Yes Reason for Co-Treatment: Complexity of the patient's impairments (multi-system involvement);For patient/therapist safety;To address functional/ADL transfers   OT goals addressed during session: Proper use of Adaptive equipment and DME      AM-PAC PT "6 Clicks" Mobility   Outcome Measure  Help needed turning from your back to your side while in a flat bed without using bedrails?: A Little Help needed moving from  lying on your back to sitting on the side of a flat bed without using bedrails?: A Lot Help needed moving to and from a bed to a chair (including a wheelchair)?: Total Help needed standing up from a chair using your arms (e.g., wheelchair or bedside chair)?: Total Help needed to walk in hospital room?: Total Help needed climbing 3-5 steps with a railing? : Total 6 Click Score: 9    End of Session   Activity Tolerance: Patient tolerated treatment well Patient left: in chair;with call bell/phone within reach Nurse Communication: Mobility status PT Visit Diagnosis: Muscle weakness (generalized) (M62.81);History of falling (Z91.81)     Time: 5400-8676 PT Time Calculation (min) (ACUTE ONLY): 31 min  Charges:  $Therapeutic Activity: 8-22 mins                       Lillia Pauls, PT, DPT Acute Rehabilitation Services Pager [phone] Office [phone]    Norval Morton 11/04/2019, 9:21 AM

## 2019-11-04 NOTE — Plan of Care (Signed)
  Problem: Education: Goal: Knowledge of General Education information will improve Description: Including pain rating scale, medication(s)/side effects and non-pharmacologic comfort measures Outcome: Progressing   Problem: Activity: Goal: Risk for activity intolerance will decrease Outcome: Progressing   

## 2019-11-04 NOTE — Progress Notes (Signed)
Pt placed on 3L [place] for sleep. Pt asking about wearing CPAP again. Instructed pt to speak with MD about wearing, and placing new order. Advised pt that once he was cleared to wear again, we would set machine up for him. RT will continue to monitor.

## 2019-11-04 NOTE — Progress Notes (Signed)
[facility] PROGRESS NOTE    Progress Note  Douglas [name]  KDX:[id number] DOB: [birth year] DOA: 10/20/2019 PCP: Toma Deiters, MD     Brief Narrative:   Douglas [name] is an 49 y.o. male past medical history of diabetes mellitus, essential hypertension, paroxysmal atrial fibrillation on Eliquis who presents to the hospital after mechanical fall which occurred several weeks ago in the ED he was found to have an SBO and in acute renal failure NG tube was placed was seen by surgery for suspicious of ileus which is now resolved.  [facility] was consulted and due to low blood pressure and poor urine output PCCM was also consulted patient was transferred to the intensive care for observation and likely transition to CV[facility].  Transition to IV heparin.  Assessment/Plan:   ATN due to hypotension present on admission: ARB diuretics and NSAIDs were discontinued. CRRT on 10/22/2019 through 10/26/2019, his creatinine has improved drastically as well as his urine output. And his creatinine has improved to baseline.  Shock multifactorial in the setting of hypotension/questionable UTI: He completed 7-day course of IV antibiotics last dose on 10/29/2019, shock criteria resolved with supportive care and IV fluids.  Ileus: Resolved in the setting of renal failure tolerating his diet. Resolved.  Hypokalemia/hypomagnesemia: Potassium was low this morning replete orally, recheck basic metabolic panel.  Paroxysmal atrial fibrillation: With a chads vas score greater than 2, continue diltiazem and metoprolol, transition him to Eliquis.  Chronic diastolic heart failure: Continue metoprolol, holding Lasix, losartan restarted on 11/01/2019, blood pressure to kidney function tolerating it.  Essential hypertension: Continue hydralazine diltiazem and metoprolol.  Diabetes mellitus type 2: With an A1c of 6.8 on 10/21/2019: Continue sliding scale insulin.  Will resume oral hypoglycemic  agents as an outpatient.  Knee injury with left high-grade quadriceps tear and medial with articular tear and possible lateral meniscus tear with right complete patella tendon rupture: Orthopedic surgery was consulted and she status post quadricep tendon repair and arthroscopic knee and meniscus debridement on the left and chronic patellar tendon repair on 11/01/2019. Orthopedic surgery injury recommended nonweightbearing status in both legs, keep full extension of the knee over the next 4 to 6 weeks, follow-up with orthopedic surgery as an outpatient in 7 to 10 days. Physical therapy evaluated the patient and recommended inpatient rehab awaiting consultation patient is stable for discharge.  Morbid obesity: Lifestyle modification.   DVT prophylaxis: heparin Family Communication:none Status is: Inpatient  Remains inpatient appropriate because:IV treatments appropriate due to intensity of illness or inability to take PO   Dispo: The patient is from: Home              Anticipated d/c is to: Inpatient rehab              Anticipated d/c date is: 1 days              Patient currently is medically stable to d/c.   Code Status:     Code Status Orders  (From admission, onward)         Start     Ordered   10/22/19 1502  Full code  Continuous     10/22/19 1503        Code Status History    Date Active Date Inactive Code Status Order ID Comments User Context   10/21/2019 0011 10/22/2019 1503 Full Code [id number]  Arvilla Market, DO ED   Advance Care Planning Activity        IV  Access:    Peripheral IV   Procedures and diagnostic studies:   No results found.   Medical Consultants:    None.  Anti-Infectives:   none  Subjective:    Douglas [name] having bowel movements pain is controlled.  Objective:    Vitals:   11/03/19 2340 11/04/19 0500 11/04/19 0737 11/04/19 0754  BP: 129/85 (!) 142/91 136/67   Pulse:  (!) 105 96   Resp:  14 18     Temp:  98.5 F (36.9 C) 98.8 F (37.1 C)   TempSrc:  Oral Oral   SpO2:  99% 97% 98%  Weight:      Height:       SpO2: 98 % O2 Flow Rate (L/min): 1 L/min FiO2 (%): 28 %   Intake/Output Summary (Last 24 hours) at 11/04/2019 0837 Last data filed at 11/03/2019 1500 Gross per 24 hour  Intake 720 ml  Output --  Net 720 ml   Filed Weights   10/27/19 0330 10/31/19 0937 11/01/19 1520  Weight: (!) 181.4 kg (!) 182.1 kg (!) 182.1 kg    Exam: General exam: In no acute distress. Respiratory system: Good air movement and clear to auscultation. Cardiovascular system: S1 & S2 heard, RRR. No JVD. Gastrointestinal system: Abdomen is nondistended, soft and nontender.  Extremities: No pedal edema. Skin: No rashes, lesions or ulcers  Data Reviewed:    Labs: Basic Metabolic Panel: Recent Labs  Lab 10/30/19 0418 10/30/19 0418 10/31/19 0444 10/31/19 0444 11/01/19 7517 11/01/19 0625 11/02/19 0259 11/03/19 0207 11/04/19 0623  NA 143  --  143  --  142  --  139 142  --   K 3.3*   < > 3.3*   < > 3.6   < > 3.9 4.1  --   CL 109  --  112*  --  110  --  109 110  --   CO2 23  --  23  --  24  --  22 23  --   GLUCOSE 111*  --  111*  --  103*  --  193* 106*  --   BUN 20  --  15  --  12  --  10 10  --   CREATININE 0.84  --  0.87  --  0.81  --  0.82 0.84  --   CALCIUM 9.0  --  8.8*  --  9.1  --  8.9 8.7*  --   MG 1.3*   < > 1.4*  --  1.6*  --  1.6* 1.7 1.6*  PHOS 3.8  --  3.6  --  3.4  --  4.1 3.6  --    < > = values in this interval not displayed.   GFR Estimated Creatinine Clearance: 177.7 mL/min (by C-G formula based on SCr of 0.84 mg/dL). Liver Function Tests: Recent Labs  Lab 10/30/19 0418 10/31/19 0444 11/01/19 0625 11/02/19 0259 11/03/19 0207  ALBUMIN 2.3* 2.4* 2.5* 2.5* 2.4*   No results for input(s): LIPASE, AMYLASE in the last 168 hours. No results for input(s): AMMONIA in the last 168 hours. Coagulation profile No results for input(s): INR, PROTIME in the last 168  hours. COVID-19 Labs  No results for input(s): DDIMER, FERRITIN, LDH, CRP in the last 72 hours.  Lab Results  Component Value Date   SARSCOV2NAA NEGATIVE 10/20/2019   SARSCOV2NAA Not Detected 04/15/2019    CBC: Recent Labs  Lab 10/29/19 0341 10/30/19 0418 10/31/19 0443 11/01/19 0625  WBC 13.3*  12.1* 10.9* 9.9  HGB 10.5* 10.2* 10.0* 10.1*  HCT 33.7* 33.4* 32.7* 32.7*  MCV 89.2 90.5 91.9 91.1  PLT 308 290 287 311   Cardiac Enzymes: No results for input(s): CKTOTAL, CKMB, CKMBINDEX, TROPONINI in the last 168 hours. BNP (last 3 results) No results for input(s): PROBNP in the last 8760 hours. CBG: Recent Labs  Lab 11/03/19 0550 11/03/19 1200 11/03/19 1724 11/03/19 2327 11/04/19 0557  GLUCAP 97 113* 101* 86 88   D-Dimer: No results for input(s): DDIMER in the last 72 hours. Hgb A1c: No results for input(s): HGBA1C in the last 72 hours. Lipid Profile: No results for input(s): CHOL, HDL, LDLCALC, TRIG, CHOLHDL, LDLDIRECT in the last 72 hours. Thyroid function studies: No results for input(s): TSH, T4TOTAL, T3FREE, THYROIDAB in the last 72 hours.  Invalid input(s): FREET3 Anemia work up: No results for input(s): VITAMINB12, FOLATE, FERRITIN, TIBC, IRON, RETICCTPCT in the last 72 hours. Sepsis Labs: Recent Labs  Lab 10/29/19 0341 10/30/19 0418 10/31/19 0443 11/01/19 0625  WBC 13.3* 12.1* 10.9* 9.9   Microbiology No results found for this or any previous visit (from the past 240 hour(s)).   Medications:   . apixaban  5 mg Oral BID  . chlorhexidine  15 mL Mouth Rinse BID  . Chlorhexidine Gluconate Cloth  6 each Topical Q0600  . diltiazem  60 mg Oral Q6H  . docusate sodium  100 mg Oral BID  . hydrALAZINE  75 mg Oral Q6H  . insulin aspart  0-9 Units Subcutaneous Q6H  . mouth rinse  15 mL Mouth Rinse q12n4p  . metoprolol succinate  100 mg Oral Daily  . mometasone-formoterol  2 puff Inhalation BID  . multivitamin with minerals  1 tablet Oral Daily  .  pantoprazole  40 mg Oral Daily  . potassium chloride  20 mEq Oral TID  . Ensure Max Protein  11 oz Oral Daily  . sertraline  50 mg Oral Daily  . sodium chloride flush  10-40 mL Intracatheter Q12H   Continuous Infusions:    LOS: 15 days   Charlynne Cousins  [facility]  11/04/2019, 8:37 AM

## 2019-11-04 NOTE — Progress Notes (Signed)
Inpatient Rehabilitation-Admissions Coordinator   Noted pt was able to progress OOB today with therapies using maximove. Pt appears to have tolerated the session well and is highly motivated. AC will begin insurance authorization process for possible admit.   Will update once there has been a determination.   Cheri Rous, OTR/L  Rehab Admissions Coordinator  [phone] 11/04/2019 10:29 AM

## 2019-11-04 NOTE — Progress Notes (Signed)
Occupational Therapy Treatment Patient Details Name: Douglas [name] MRN: [id number] DOB: [birth year] Today's Date: 11/04/2019    History of present illness 49 yo male who has not walked in four weeks was admitted, noted his pain in knees from a recent fall that led to nonambulatory status.  Pt is now in acute renal failure, had low BP, shock,  and AKI with hypotension, acute renal failure.  Per MRI has L quad tear, medial retinacular tear, R patellar tendon rupture, lateral meniscal tear. s/p left Quadricep tendon repair, left knee and meniscus debridement, right knee patellar tendon repair. PMHx:  CHF, EF 50-55%, PAF, ileus, HTN   OT comments  Pt making steady progress towards OT goals this session. Pt continues to present with increased pain in BLEs, decreased activity tolerance and generalized weakness impacting pts ability to engage in ADLs. Pt able to progress OOB to chair via maximove this session. Pt required MIN A +2 to roll in bed to place lift pad needing most assist to manage BLEs. Initiated instruction on scooting hips back in chair using BUEs and shifting trunk anteriorly as precursor to functional transfer training. DC plan currently remains appropriate, will follow acutely per POC.   Follow Up Recommendations  CIR    Equipment Recommendations  Wheelchair (measurements OT);Hospital bed;Other (comment)(will continue to assess)    Recommendations for Other Services      Precautions / Restrictions Precautions Precautions: Fall Required Braces or Orthoses: Knee Immobilizer - Right;Knee Immobilizer - Left Knee Immobilizer - Right: On at all times Knee Immobilizer - Left: On at all times Restrictions Weight Bearing Restrictions: Yes RLE Weight Bearing: Non weight bearing LLE Weight Bearing: Non weight bearing       Mobility Bed Mobility Overal bed mobility: Needs Assistance Bed Mobility: Rolling Rolling: Min assist;+2 for physical assistance         General bed  mobility comments: Rolling to right and left for placement of maxi move pad with minA for contralateral leg management  Transfers                      Balance                                           ADL either performed or assessed with clinical judgement   ADL Overall ADL's : Needs assistance/impaired                         Toilet Transfer: Total assistance;+2 for physical assistance;+2 for safety/equipment Toilet Transfer Details (indicate cue type and reason): simulated via total A lift with maximove to chair         Functional mobility during ADLs: Total assistance;+2 for physical assistance General ADL Comments: total A +2 for maximove lift to chair; education on using BUE on arms of chair to adjust trunk in sitting     Vision       Perception     Praxis      Cognition Arousal/Alertness: Awake/alert Behavior During Therapy: WFL for tasks assessed/performed Overall Cognitive Status: Within Functional Limits for tasks assessed                                 General Comments: very appreciative of getting OOB this session  Exercises Exercises: General Lower Extremity;Other exercises General Exercises - Lower Extremity Ankle Circles/Pumps: Both;15 reps;Supine Other Exercises Other Exercises: Sitting in recliner: education/instruction on reciprocal scooting, head/hip relationship, anterior lean with pulling on chair arms for core and trunk activation   Shoulder Instructions       General Comments      Pertinent Vitals/ Pain       Pain Assessment: Faces Faces Pain Scale: Hurts even more Pain Location: No pain at rest; grimacing with certain aspects of movement Pain Descriptors / Indicators: Guarding;Grimacing;Sore Pain Intervention(s): Monitored during session;Repositioned  Home Living                                          Prior Functioning/Environment               Frequency  Min 2X/week        Progress Toward Goals  OT Goals(current goals can now be found in the care plan section)  Progress towards OT goals: Progressing toward goals  Acute Rehab OT Goals Patient Stated Goal: To go to rehab and get stronger. OT Goal Formulation: With patient Time For Goal Achievement: 11/14/19 Potential to Achieve Goals: Good  Plan Discharge plan remains appropriate    Co-evaluation    PT/OT/SLP Co-Evaluation/Treatment: Yes Reason for Co-Treatment: Complexity of the patient's impairments (multi-system involvement);For patient/therapist safety;To address functional/ADL transfers   OT goals addressed during session: Proper use of Adaptive equipment and DME      AM-PAC OT "6 Clicks" Daily Activity     Outcome Measure   Help from another person eating meals?: A Little Help from another person taking care of personal grooming?: A Little Help from another person toileting, which includes using toliet, bedpan, or urinal?: A Lot Help from another person bathing (including washing, rinsing, drying)?: A Lot Help from another person to put on and taking off regular upper body clothing?: A Little Help from another person to put on and taking off regular lower body clothing?: A Lot 6 Click Score: 15    End of Session Equipment Utilized During Treatment: Right knee immobilizer;Left knee immobilizer  OT Visit Diagnosis: Other abnormalities of gait and mobility (R26.89);History of falling (Z91.81);Muscle weakness (generalized) (M62.81)   Activity Tolerance Patient tolerated treatment well   Patient Left in chair;with call bell/phone within reach   Nurse Communication Mobility status        Time: 4665-9935 OT Time Calculation (min): 31 min  Charges: OT General Charges $OT Visit: 1 Visit OT Treatments $Therapeutic Activity: 8-22 mins  Audery Amel., COTA/L Acute Rehabilitation Services [phone]    Angelina Pih 11/04/2019,  10:25 AM

## 2019-11-05 LAB — GLUCOSE, CAPILLARY
Glucose-Capillary: 108 mg/dL — ABNORMAL HIGH (ref 70–99)
Glucose-Capillary: 125 mg/dL — ABNORMAL HIGH (ref 70–99)
Glucose-Capillary: 132 mg/dL — ABNORMAL HIGH (ref 70–99)
Glucose-Capillary: 90 mg/dL (ref 70–99)
Glucose-Capillary: 92 mg/dL (ref 70–99)
Glucose-Capillary: 95 mg/dL (ref 70–99)

## 2019-11-05 LAB — CBC
HCT: 30.4 % — ABNORMAL LOW (ref 39.0–52.0)
Hemoglobin: 9.2 g/dL — ABNORMAL LOW (ref 13.0–17.0)
MCH: 27.9 pg (ref 26.0–34.0)
MCHC: 30.3 g/dL (ref 30.0–36.0)
MCV: 92.1 fL (ref 80.0–100.0)
Platelets: 402 10*3/uL — ABNORMAL HIGH (ref 150–400)
RBC: 3.3 MIL/uL — ABNORMAL LOW (ref 4.22–5.81)
RDW: 16.5 % — ABNORMAL HIGH (ref 11.5–15.5)
WBC: 9 10*3/uL (ref 4.0–10.5)
nRBC: 0 % (ref 0.0–0.2)

## 2019-11-05 LAB — MAGNESIUM: Magnesium: 1.5 mg/dL — ABNORMAL LOW (ref 1.7–2.4)

## 2019-11-05 NOTE — Progress Notes (Signed)
[facility] PROGRESS NOTE    Progress Note  Douglas [name]  HYW:[id number] DOB: 03-15-1971 DOA: 10/20/2019 PCP: Neale Burly, MD     Brief Narrative:   Douglas [name] is an 49 y.o. male past medical history of diabetes mellitus, essential hypertension, paroxysmal atrial fibrillation on Eliquis who presents to the hospital after mechanical fall which occurred several weeks ago in the ED he was found to have an SBO and in acute renal failure NG tube was placed was seen by surgery for suspicious of ileus which is now resolved.  [facility] was consulted and due to low blood pressure and poor urine output PCCM was also consulted patient was transferred to the intensive care for observation and likely transition to [facility].  Transition to IV heparin.  Assessment/Plan:   ATN due to hypotension present on admission: ARB diuretics and NSAIDs were discontinued. CRRT on 10/22/2019 through 10/26/2019, his creatinine has improved drastically as well as his urine output. And his creatinine has improved to baseline.  Shock multifactorial in the setting of hypotension/questionable UTI: He completed 7-day course of IV antibiotics last dose on 10/29/2019, shock criteria resolved with supportive care and IV fluids.  Ileus: Resolved in the setting of renal failure tolerating his diet. Resolved.  Hypokalemia/hypomagnesemia: Potassium was low this morning replete orally, recheck basic metabolic panel.  Paroxysmal atrial fibrillation: With a chads vas score greater than 2, continue diltiazem and metoprolol, transition him to Eliquis.  Chronic diastolic heart failure: Continue metoprolol, holding Lasix, losartan restarted on 11/01/2019, blood pressure to kidney function tolerating it.  Essential hypertension: Continue hydralazine diltiazem and metoprolol.  Diabetes mellitus type 2: With an A1c of 6.8 on 10/21/2019: Continue sliding scale insulin.  Will resume oral hypoglycemic  agents as an outpatient.  Knee injury with left high-grade quadriceps tear and medial with articular tear and possible lateral meniscus tear with right complete patella tendon rupture: Orthopedic surgery was consulted and she status post quadricep tendon repair and arthroscopic knee and meniscus debridement on the left and chronic patellar tendon repair on 11/01/2019. Orthopedic surgery injury recommended nonweightbearing status in both legs, keep full extension of the knee over the next 4 to 6 weeks, follow-up with orthopedic surgery as an outpatient in 7 to 10 days. Physical therapy evaluated the patient and recommended inpatient rehab awaiting consultation patient is stable for discharge.  Morbid obesity: Lifestyle modification.   DVT prophylaxis: heparin Family Communication:none Status is: Inpatient  Remains inpatient appropriate because:IV treatments appropriate due to intensity of illness or inability to take PO   Dispo: The patient is from: Home              Anticipated d/c is to: Inpatient rehab              Anticipated d/c date is: 1 days              Patient currently is medically stable to d/c.  Awaiting inpatient rehab approval for transfer.   Code Status:     Code Status Orders  (From admission, onward)         Start     Ordered   10/22/19 1502  Full code  Continuous     10/22/19 1503        Code Status History    Date Active Date Inactive Code Status Order ID Comments User Context   10/21/2019 0011 10/22/2019 1503 Full Code [id number]  Nicolette Bang, DO ED   Advance Care Planning Activity  IV Access:    Peripheral IV   Procedures and diagnostic studies:   No results found.   Medical Consultants:    None.  Anti-Infectives:   none  Subjective:    Kerim Statzer Mintzer having bowel movements pain is controlled.  Objective:    Vitals:   11/04/19 2357 11/05/19 0448 11/05/19 0612 11/05/19 0751  BP: 131/86 114/73 130/80  111/73  Pulse: 85 85  95  Resp:  17  16  Temp:  98.2 F (36.8 C)  98 F (36.7 C)  TempSrc:  Oral  Oral  SpO2: 100% 100%  98%  Weight:      Height:       SpO2: 98 % O2 Flow Rate (L/min): 3 L/min FiO2 (%): 28 %   Intake/Output Summary (Last 24 hours) at 11/05/2019 0810 Last data filed at 11/04/2019 2020 Gross per 24 hour  Intake 360 ml  Output -  Net 360 ml   Filed Weights   10/27/19 0330 10/31/19 0937 11/01/19 1520  Weight: (!) 181.4 kg (!) 182.1 kg (!) 182.1 kg    Exam: General exam: In no acute distress. Respiratory system: Good air movement and clear to auscultation. Cardiovascular system: S1 & S2 heard, RRR. No JVD. Gastrointestinal system: Abdomen is nondistended, soft and nontender.  Extremities: No pedal edema. Skin: No rashes, lesions or ulcers  Data Reviewed:    Labs: Basic Metabolic Panel: Recent Labs  Lab 10/30/19 0418 10/30/19 0418 10/31/19 0444 10/31/19 0444 11/01/19 9233 11/01/19 0076 11/02/19 0259 11/02/19 0259 11/03/19 0207 11/04/19 0623 11/05/19 0405  NA 143   < > 143  --  142  --  139  --  142 140  --   K 3.3*   < > 3.3*   < > 3.6   < > 3.9   < > 4.1 4.1  --   CL 109   < > 112*  --  110  --  109  --  110 107  --   CO2 23   < > 23  --  24  --  22  --  23 24  --   GLUCOSE 111*   < > 111*  --  103*  --  193*  --  106* 96  --   BUN 20   < > 15  --  12  --  10  --  10 8  --   CREATININE 0.84   < > 0.87  --  0.81  --  0.82  --  0.84 0.73  --   CALCIUM 9.0   < > 8.8*  --  9.1  --  8.9  --  8.7* 8.8*  --   MG 1.3*   < > 1.4*   < > 1.6*  --  1.6*  --  1.7 1.6* 1.5*  PHOS 3.8  --  3.6  --  3.4  --  4.1  --  3.6  --   --    < > = values in this interval not displayed.   GFR Estimated Creatinine Clearance: 186.6 mL/min (by C-G formula based on SCr of 0.73 mg/dL). Liver Function Tests: Recent Labs  Lab 10/30/19 0418 10/31/19 0444 11/01/19 0625 11/02/19 0259 11/03/19 0207  ALBUMIN 2.3* 2.4* 2.5* 2.5* 2.4*   No results for input(s):  LIPASE, AMYLASE in the last 168 hours. No results for input(s): AMMONIA in the last 168 hours. Coagulation profile No results for input(s): INR, PROTIME in the last 168 hours.  COVID-19 Labs  No results for input(s): DDIMER, FERRITIN, LDH, CRP in the last 72 hours.  Lab Results  Component Value Date   SARSCOV2NAA NEGATIVE 10/20/2019   SARSCOV2NAA Not Detected 04/15/2019    CBC: Recent Labs  Lab 10/30/19 0418 10/31/19 0443 11/01/19 0625 11/05/19 0405  WBC 12.1* 10.9* 9.9 9.0  HGB 10.2* 10.0* 10.1* 9.2*  HCT 33.4* 32.7* 32.7* 30.4*  MCV 90.5 91.9 91.1 92.1  PLT 290 287 311 402*   Cardiac Enzymes: No results for input(s): CKTOTAL, CKMB, CKMBINDEX, TROPONINI in the last 168 hours. BNP (last 3 results) No results for input(s): PROBNP in the last 8760 hours. CBG: Recent Labs  Lab 11/04/19 0557 11/04/19 1213 11/04/19 1826 11/04/19 2355 11/05/19 0601  GLUCAP 88 104* 90 95 108*   D-Dimer: No results for input(s): DDIMER in the last 72 hours. Hgb A1c: No results for input(s): HGBA1C in the last 72 hours. Lipid Profile: No results for input(s): CHOL, HDL, LDLCALC, TRIG, CHOLHDL, LDLDIRECT in the last 72 hours. Thyroid function studies: No results for input(s): TSH, T4TOTAL, T3FREE, THYROIDAB in the last 72 hours.  Invalid input(s): FREET3 Anemia work up: No results for input(s): VITAMINB12, FOLATE, FERRITIN, TIBC, IRON, RETICCTPCT in the last 72 hours. Sepsis Labs: Recent Labs  Lab 10/30/19 0418 10/31/19 0443 11/01/19 0625 11/05/19 0405  WBC 12.1* 10.9* 9.9 9.0   Microbiology No results found for this or any previous visit (from the past 240 hour(s)).   Medications:   . apixaban  5 mg Oral BID  . atorvastatin  20 mg Oral Daily  . chlorhexidine  15 mL Mouth Rinse BID  . Chlorhexidine Gluconate Cloth  6 each Topical Q0600  . diltiazem  360 mg Oral Daily  . furosemide  40 mg Oral Daily  . hydrALAZINE  75 mg Oral Q6H  . insulin aspart  0-9 Units Subcutaneous  Q6H  . mouth rinse  15 mL Mouth Rinse q12n4p  . metoprolol succinate  100 mg Oral Daily  . mometasone-formoterol  2 puff Inhalation BID  . multivitamin with minerals  1 tablet Oral Daily  . pantoprazole  40 mg Oral Daily  . potassium chloride  20 mEq Oral TID  . Ensure Max Protein  11 oz Oral Daily  . sertraline  50 mg Oral Daily  . sodium chloride flush  10-40 mL Intracatheter Q12H   Continuous Infusions:    LOS: 16 days   Marinda Elk  [facility]  11/05/2019, 8:10 AM

## 2019-11-06 LAB — BASIC METABOLIC PANEL
Anion gap: 8 (ref 5–15)
BUN: 13 mg/dL (ref 6–20)
CO2: 26 mmol/L (ref 22–32)
Calcium: 8.9 mg/dL (ref 8.9–10.3)
Chloride: 103 mmol/L (ref 98–111)
Creatinine, Ser: 0.83 mg/dL (ref 0.61–1.24)
GFR calc Af Amer: >60 mL/min (ref >60)
GFR calc non Af Amer: >60 mL/min (ref >60)
Glucose, Bld: 98 mg/dL (ref 70–99)
Potassium: 4.2 mmol/L (ref 3.5–5.1)
Sodium: 137 mmol/L (ref 135–145)

## 2019-11-06 LAB — GLUCOSE, CAPILLARY
Glucose-Capillary: 100 mg/dL — ABNORMAL HIGH (ref 70–99)
Glucose-Capillary: 92 mg/dL (ref 70–99)
Glucose-Capillary: 103 mg/dL — ABNORMAL HIGH (ref 70–99)

## 2019-11-06 LAB — MAGNESIUM: Magnesium: 1.7 mg/dL (ref 1.7–2.4)

## 2019-11-06 MED ORDER — MAGNESIUM OXIDE 400 (241.3 MG) MG PO TABS
400.0000 mg | ORAL_TABLET | Freq: Two times a day (BID) | ORAL | Status: AC
  Administered 2019-11-06 (×2): 400 mg via ORAL
  Filled 2019-11-06 (×2): qty 1

## 2019-11-06 NOTE — Progress Notes (Signed)
Pt places self on/off cpap as needed. Rt will monitor. 

## 2019-11-06 NOTE — Plan of Care (Signed)

## 2019-11-06 NOTE — Progress Notes (Signed)
[facility] PROGRESS NOTE    Progress Note  Douglas [name]  BJS:[id number] DOB: Sep 04, 1970 DOA: 10/20/2019 PCP: Neale Burly, MD     Brief Narrative:   Douglas [name] is an 49 y.o. male past medical history of diabetes mellitus, essential hypertension, paroxysmal atrial fibrillation on Eliquis who presents to the hospital after mechanical fall which occurred several weeks ago in the ED he was found to have an SBO and in acute renal failure NG tube was placed was seen by surgery for suspicious of ileus which is now resolved.  [facility] was consulted and due to low blood pressure and poor urine output PCCM was also consulted patient was transferred to the intensive care for observation and likely transition to [facility].  Transition to IV heparin.  Assessment/Plan:   ATN due to hypotension present on admission: ARB diuretics and NSAIDs were discontinued. CRRT on 10/22/2019 through 10/26/2019, his creatinine has improved drastically as well as his urine output. And his creatinine has improved to baseline.  Shock multifactorial in the setting of hypotension/questionable UTI: He completed 7-day course of IV antibiotics last dose on 10/29/2019, shock criteria resolved with supportive care and IV fluids.  Ileus: Resolved in the setting of renal failure tolerating his diet. Resolved.  Hypokalemia/hypomagnesemia: Potassium was low this morning replete orally, recheck basic metabolic panel.  Paroxysmal atrial fibrillation: With a chads vas score greater than 2, continue diltiazem and metoprolol, transition him to Eliquis.  Chronic diastolic heart failure: Continue metoprolol, holding Lasix, losartan restarted on 11/01/2019, blood pressure to kidney function tolerating it.  Essential hypertension: Continue hydralazine diltiazem and metoprolol.  Diabetes mellitus type 2: With an A1c of 6.8 on 10/21/2019: Continue sliding scale insulin.  Will resume oral hypoglycemic  agents as an outpatient.  Knee injury with left high-grade quadriceps tear and medial with articular tear and possible lateral meniscus tear with right complete patella tendon rupture: Orthopedic surgery was consulted and she status post quadricep tendon repair and arthroscopic knee and meniscus debridement on the left and chronic patellar tendon repair on 11/01/2019. Orthopedic surgery injury recommended nonweightbearing status in both legs, keep full extension of the knee over the next 4 to 6 weeks, follow-up with orthopedic surgery as an outpatient in 7 to 10 days. Physical therapy evaluated the patient and recommended inpatient rehab awaiting consultation patient is stable for discharge.  Morbid obesity: Lifestyle modification.   DVT prophylaxis: heparin Family Communication:none Status is: Inpatient  Remains inpatient appropriate because:IV treatments appropriate due to intensity of illness or inability to take PO   Dispo: The patient is from: Home              Anticipated d/c is to: Inpatient rehab              Anticipated d/c date is: 1 days              Patient currently is medically stable to d/c.  Awaiting inpatient rehab approval for transfer.   Code Status:     Code Status Orders  (From admission, onward)         Start     Ordered   10/22/19 1502  Full code  Continuous     10/22/19 1503        Code Status History    Date Active Date Inactive Code Status Order ID Comments User Context   10/21/2019 0011 10/22/2019 1503 Full Code [id number]  Nicolette Bang, DO ED   Advance Care Planning Activity  IV Access:    Peripheral IV   Procedures and diagnostic studies:   No results found.   Medical Consultants:    None.  Anti-Infectives:   none  Subjective:    Douglas [name] no new complains  Objective:    Vitals:   11/05/19 1922 11/05/19 2114 11/06/19 0303 11/06/19 0724  BP: 128/79  134/81 (!) 144/87  Pulse: 91  77 88   Resp: 17  14 18   Temp: 97.9 F (36.6 C)  97.6 F (36.4 C) 98 F (36.7 C)  TempSrc: Oral  Oral Oral  SpO2: 97% 97% 94% 96%  Weight:      Height:       SpO2: 96 % O2 Flow Rate (L/min): 3 L/min FiO2 (%): 28 %   Intake/Output Summary (Last 24 hours) at 11/06/2019 0727 Last data filed at 11/05/2019 1100 Gross per 24 hour  Intake 240 ml  Output -  Net 240 ml   Filed Weights   10/27/19 0330 10/31/19 0937 11/01/19 1520  Weight: (!) 181.4 kg (!) 182.1 kg (!) 182.1 kg    Exam: General exam: In no acute distress. Respiratory system: Good air movement and clear to auscultation. Cardiovascular system: S1 & S2 heard, RRR. No JVD. Gastrointestinal system: Abdomen is nondistended, soft and nontender.  Extremities: No pedal edema. Skin: No rashes, lesions or ulcers  Data Reviewed:    Labs: Basic Metabolic Panel: Recent Labs  Lab 10/31/19 0444 10/31/19 0444 11/01/19 11/03/19 11/01/19 11/03/19 11/02/19 0259 11/02/19 0259 11/03/19 0207 11/04/19 0623 11/05/19 0405 11/06/19 0255  NA 143  --  142  --  139  --  142 140  --   --   K 3.3*   < > 3.6   < > 3.9   < > 4.1 4.1  --   --   CL 112*  --  110  --  109  --  110 107  --   --   CO2 23  --  24  --  22  --  23 24  --   --   GLUCOSE 111*  --  103*  --  193*  --  106* 96  --   --   BUN 15  --  12  --  10  --  10 8  --   --   CREATININE 0.87  --  0.81  --  0.82  --  0.84 0.73  --   --   CALCIUM 8.8*  --  9.1  --  8.9  --  8.7* 8.8*  --   --   MG 1.4*   < > 1.6*   < > 1.6*  --  1.7 1.6* 1.5* 1.7  PHOS 3.6  --  3.4  --  4.1  --  3.6  --   --   --    < > = values in this interval not displayed.   GFR Estimated Creatinine Clearance: 186.6 mL/min (by C-G formula based on SCr of 0.73 mg/dL). Liver Function Tests: Recent Labs  Lab 10/31/19 0444 11/01/19 0625 11/02/19 0259 11/03/19 0207  ALBUMIN 2.4* 2.5* 2.5* 2.4*   No results for input(s): LIPASE, AMYLASE in the last 168 hours. No results for input(s): AMMONIA in the last 168 hours.  Coagulation profile No results for input(s): INR, PROTIME in the last 168 hours. COVID-19 Labs  No results for input(s): DDIMER, FERRITIN, LDH, CRP in the last 72 hours.  Lab Results  Component Value Date  SARSCOV2NAA NEGATIVE 10/20/2019   SARSCOV2NAA Not Detected 04/15/2019    CBC: Recent Labs  Lab 10/31/19 0443 11/01/19 0625 11/05/19 0405  WBC 10.9* 9.9 9.0  HGB 10.0* 10.1* 9.2*  HCT 32.7* 32.7* 30.4*  MCV 91.9 91.1 92.1  PLT 287 311 402*   Cardiac Enzymes: No results for input(s): CKTOTAL, CKMB, CKMBINDEX, TROPONINI in the last 168 hours. BNP (last 3 results) No results for input(s): PROBNP in the last 8760 hours. CBG: Recent Labs  Lab 11/05/19 0601 11/05/19 1135 11/05/19 1821 11/05/19 2356 11/06/19 0602  GLUCAP 108* 90 125* 92 100*   D-Dimer: No results for input(s): DDIMER in the last 72 hours. Hgb A1c: No results for input(s): HGBA1C in the last 72 hours. Lipid Profile: No results for input(s): CHOL, HDL, LDLCALC, TRIG, CHOLHDL, LDLDIRECT in the last 72 hours. Thyroid function studies: No results for input(s): TSH, T4TOTAL, T3FREE, THYROIDAB in the last 72 hours.  Invalid input(s): FREET3 Anemia work up: No results for input(s): VITAMINB12, FOLATE, FERRITIN, TIBC, IRON, RETICCTPCT in the last 72 hours. Sepsis Labs: Recent Labs  Lab 10/31/19 0443 11/01/19 0625 11/05/19 0405  WBC 10.9* 9.9 9.0   Microbiology No results found for this or any previous visit (from the past 240 hour(s)).   Medications:   . apixaban  5 mg Oral BID  . atorvastatin  20 mg Oral Daily  . chlorhexidine  15 mL Mouth Rinse BID  . Chlorhexidine Gluconate Cloth  6 each Topical Q0600  . diltiazem  360 mg Oral Daily  . furosemide  40 mg Oral Daily  . hydrALAZINE  75 mg Oral Q6H  . insulin aspart  0-9 Units Subcutaneous Q6H  . mouth rinse  15 mL Mouth Rinse q12n4p  . metoprolol succinate  100 mg Oral Daily  . mometasone-formoterol  2 puff Inhalation BID  . multivitamin  with minerals  1 tablet Oral Daily  . pantoprazole  40 mg Oral Daily  . potassium chloride  20 mEq Oral TID  . Ensure Max Protein  11 oz Oral Daily  . sertraline  50 mg Oral Daily  . sodium chloride flush  10-40 mL Intracatheter Q12H   Continuous Infusions:    LOS: 17 days   Marinda Elk  [facility]  11/06/2019, 7:27 AM

## 2019-11-06 NOTE — Progress Notes (Signed)
   Subjective: 5 Days Post-Op Procedure(s) (LRB): QUADRICEP TENDON REPAIR (Left) ARTHROSCOPY KNEE AND MENISCUS DEBRIDEMENT (Left) KNEE ARTHROSCOPY WITH PATELLAR TENDON REPAIR (Right) Patient reports pain as mild.    Objective: Vital signs in last 24 hours: Temp:  [97.6 F (36.4 C)-98.2 F (36.8 C)] 97.6 F (36.4 C) (05/23 0303) Pulse Rate:  [77-95] 77 (05/23 0303) Resp:  [14-17] 14 (05/23 0303) BP: (111-134)/(66-81) 134/81 (05/23 0303) SpO2:  [94 %-98 %] 94 % (05/23 0303)  Intake/Output from previous day: 05/22 0701 - 05/23 0700 In: 240 [P.O.:240] Out: -  Intake/Output this shift: No intake/output data recorded.  Recent Labs    11/05/19 0405  HGB 9.2*   Recent Labs    11/05/19 0405  WBC 9.0  RBC 3.30*  HCT 30.4*  PLT 402*   Recent Labs    11/04/19 0623  NA 140  K 4.1  CL 107  CO2 24  BUN 8  CREATININE 0.73  GLUCOSE 96  CALCIUM 8.8*   No results for input(s): LABPT, INR in the last 72 hours.  bledsoe braces intact.  No results found.  Assessment/Plan: 5 Days Post-Op Procedure(s) (LRB): QUADRICEP TENDON REPAIR (Left) ARTHROSCOPY KNEE AND MENISCUS DEBRIDEMENT (Left) KNEE ARTHROSCOPY WITH PATELLAR TENDON REPAIR (Right) Plan:  Rehab when bed available.   Eldred Manges 11/06/2019, 7:17 AM

## 2019-11-06 NOTE — Plan of Care (Signed)
  Problem: Skin Integrity: Goal: Risk for impaired skin integrity will decrease Outcome: Progressing   Problem: Education: Goal: Knowledge of General Education information will improve Description: Including pain rating scale, medication(s)/side effects and non-pharmacologic comfort measures Outcome: Progressing   Problem: Coping: Goal: Level of anxiety will decrease Outcome: Progressing   Problem: Safety: Goal: Ability to remain free from injury will improve Outcome: Progressing

## 2019-11-07 DIAGNOSIS — A419 Sepsis, unspecified organism: Secondary | ICD-10-CM

## 2019-11-07 DIAGNOSIS — R6521 Severe sepsis with septic shock: Secondary | ICD-10-CM

## 2019-11-07 DIAGNOSIS — N170 Acute kidney failure with tubular necrosis: Principal | ICD-10-CM

## 2019-11-07 LAB — GLUCOSE, CAPILLARY
Glucose-Capillary: 109 mg/dL — ABNORMAL HIGH (ref 70–99)
Glucose-Capillary: 101 mg/dL — ABNORMAL HIGH (ref 70–99)
Glucose-Capillary: 115 mg/dL — ABNORMAL HIGH (ref 70–99)

## 2019-11-07 MED ORDER — HYDRALAZINE HCL 50 MG PO TABS: 50.0000 mg | ORAL_TABLET | Freq: Two times a day (BID) | ORAL | Status: AC

## 2019-11-07 MED ORDER — LOSARTAN POTASSIUM 100 MG PO TABS: 100.0000 mg | ORAL_TABLET | Freq: Every day | ORAL | Status: AC

## 2019-11-07 MED ORDER — LOSARTAN POTASSIUM 100 MG PO TABS: 100.0000 mg | ORAL_TABLET | Freq: Every day | ORAL | Status: DC

## 2019-11-07 NOTE — Plan of Care (Signed)

## 2019-11-07 NOTE — Plan of Care (Signed)
  Problem: Education: Goal: Knowledge of General Education information will improve Description: Including pain rating scale, medication(s)/side effects and non-pharmacologic comfort measures Outcome: Progressing   Problem: Health Behavior/Discharge Planning: Goal: Ability to manage health-related needs will improve Outcome: Progressing   Problem: Coping: Goal: Level of anxiety will decrease Outcome: Progressing   Problem: Skin Integrity: Goal: Risk for impaired skin integrity will decrease Outcome: Progressing   Problem: Pain Managment: Goal: General experience of comfort will improve Outcome: Progressing   Problem: Safety: Goal: Ability to remain free from injury will improve Outcome: Progressing   Problem: Skin Integrity: Goal: Risk for impaired skin integrity will decrease Outcome: Progressing

## 2019-11-07 NOTE — Progress Notes (Signed)
Physical Therapy Treatment Patient Details Name: Douglas [name] MRN: [id number] DOB: Nov 21, 1970 Today's Date: 11/07/2019    History of Present Illness 49 yo male who has not walked in four weeks was admitted, noted his pain in knees from a recent fall that led to nonambulatory status.  Pt is now in acute renal failure, had low BP, shock,  and AKI with hypotension, acute renal failure.  Per MRI has L quad tear, medial retinacular tear, R patellar tendon rupture, lateral meniscal tear. s/p left Quadricep tendon repair, left knee and meniscus debridement, right knee patellar tendon repair. PMHx:  CHF, EF 50-55%, PAF, ileus, HTN    PT Comments    Pt performed functional mobility with advancement to AP transfer with use of maxi slide pad to reduce friction.  +3 utilized for comfort to B LEs but likley will not need this support next time.  +2 max to scoot posterior with patient giving great effort to scoot posteriorly.  Based on functional gains to participate in an active transfer vs. Passive lift, he remains an excellent candidate for aggressive rehab at [facility] to maximize functional gains and decrease caregiver burden.      Follow Up Recommendations  [facility]     Equipment Recommendations  Other (comment)(defer)    Recommendations for Other Services Rehab consult     Precautions / Restrictions Precautions Precautions: Fall Required Braces or Orthoses: Knee Immobilizer - Right;Knee Immobilizer - Left Knee Immobilizer - Right: On at all times Knee Immobilizer - Left: On at all times Restrictions Weight Bearing Restrictions: Yes RLE Weight Bearing: Non weight bearing LLE Weight Bearing: Non weight bearing    Mobility  Bed Mobility Overal bed mobility: Needs Assistance Bed Mobility: Rolling Rolling: Min assist;+2 for physical assistance   Supine to sit: Mod assist;+2 for physical assistance     General bed mobility comments: Rolling to right and left for placement of maxi slide  pad with minA for contralateral leg management.  Pt performed supine to sit with +3 for LE management and +2 to manage maxislide pad for rotating hips.  Transfers Overall transfer level: Needs assistance Equipment used: (maxi slide pad) Transfers: Comptroller transfers: Max assist;+2 physical assistance   General transfer comment: +3 to assist B LEs but likely will not need this next time.  Utilized maxislide sling pad to move from bed to recliner with multiple stops for rest periods.  Reports feeling dizzy in sitting but resolves with rest periods.  Pt able to follow commands for hand placement to move from bed to recliner.  Reclined back of chair for ease due to body habitus.  Ambulation/Gait                 Stairs             Wheelchair Mobility    Modified Rankin (Stroke Patients Only)       Balance Overall balance assessment: Needs assistance;History of Falls   Sitting balance-Leahy Scale: Poor Sitting balance - Comments: heavy reliance on BUE support on bed rails to maintain sitting in long sitting in bed.                                    Cognition Arousal/Alertness: Awake/alert Behavior During Therapy: WFL for tasks assessed/performed Overall Cognitive Status: Within Functional Limits for tasks assessed  General Comments: Highly motivated to move from bed to recliner this session.  Pt very motivated to return home to his family      Exercises      General Comments        Pertinent Vitals/Pain Pain Assessment: Faces Faces Pain Scale: Hurts even more Pain Location: No pain at rest; grimacing with certain aspects of movement Pain Descriptors / Indicators: Guarding;Grimacing;Sore Pain Intervention(s): Monitored during session;Repositioned    Home Living                      Prior Function            PT Goals (current goals can  now be found in the care plan section) Acute Rehab PT Goals Patient Stated Goal: To go to rehab and get stronger. Potential to Achieve Goals: Good Progress towards PT goals: Progressing toward goals    Frequency    Min 5X/week      PT Plan Current plan remains appropriate    Co-evaluation PT/OT/SLP Co-Evaluation/Treatment: Yes Reason for Co-Treatment: Complexity of the patient's impairments (multi-system involvement) PT goals addressed during session: Mobility/safety with mobility OT goals addressed during session: ADL's and self-care      AM-PAC PT "6 Clicks" Mobility   Outcome Measure  Help needed turning from your back to your side while in a flat bed without using bedrails?: A Little Help needed moving from lying on your back to sitting on the side of a flat bed without using bedrails?: A Lot Help needed moving to and from a bed to a chair (including a wheelchair)?: A Lot Help needed standing up from a chair using your arms (e.g., wheelchair or bedside chair)?: Total Help needed to walk in hospital room?: Total Help needed climbing 3-5 steps with a railing? : Total 6 Click Score: 10    End of Session Equipment Utilized During Treatment: (maxislide pad) Activity Tolerance: Patient tolerated treatment well Patient left: in chair;with call bell/phone within reach Nurse Communication: Mobility status PT Visit Diagnosis: Muscle weakness (generalized) (M62.81);History of falling (Z91.81)     Time: 5573-2202 PT Time Calculation (min) (ACUTE ONLY): 35 min  Charges:  $Therapeutic Activity: 8-22 mins                     Douglas [name] , PTA Acute Rehabilitation Services Pager [phone] Office [phone]     Douglas [name] 11/07/2019, 11:09 AM

## 2019-11-07 NOTE — Progress Notes (Signed)
Inpatient Rehabilitation-Admissions Coordinator   Awaiting insurance authorization for possible admit to [facility]. Will follow up once there has been a determination. Pt and his wife aware.   Cheri Rous, OTR/L  Rehab Admissions Coordinator  [phone] 11/07/2019 10:48 AM

## 2019-11-07 NOTE — Progress Notes (Signed)
Occupational Therapy Treatment Patient Details Name: Douglas [name] MRN: [id number] DOB: 1970-12-05 Today's Date: 11/07/2019    History of present illness 49 yo male who has not walked in four weeks was admitted, noted his pain in knees from a recent fall that led to nonambulatory status.  Pt is now in acute renal failure, had low BP, shock,  and AKI with hypotension, acute renal failure.  Per MRI has L quad tear, medial retinacular tear, R patellar tendon rupture, lateral meniscal tear. s/p left Quadricep tendon repair, left knee and meniscus debridement, right knee patellar tendon repair. PMHx:  CHF, EF 50-55%, PAF, ileus, HTN   OT comments  Maxisky not working in pt's room. Pt required minA to roll right<>left in fully reclined position in recliner for placement of maximove pad. Pt required modA+2 for supine to sit to adjust bad. Pt returned to bed with maximove, required minA+2 for rolling right<>left for removal of lift pad and proper positioning of chuck pads. Pt required totalA for posterior care. Pt will continue to benefit from skilled OT services to maximize safety and independence with ADL/IADL and functional mobility. Will continue to follow acutely and progress as tolerated.    Follow Up Recommendations  [facility]    Equipment Recommendations  Wheelchair (measurements OT);Hospital bed;Other (comment)    Recommendations for Other Services      Precautions / Restrictions Precautions Precautions: Fall Required Braces or Orthoses: Knee Immobilizer - Right;Knee Immobilizer - Left Knee Immobilizer - Right: On at all times Knee Immobilizer - Left: On at all times Restrictions Weight Bearing Restrictions: Yes RLE Weight Bearing: Non weight bearing LLE Weight Bearing: Non weight bearing       Mobility Bed Mobility Overal bed mobility: Needs Assistance Bed Mobility: Rolling Rolling: Min assist;+2 for physical assistance   Supine to sit: Mod assist;+2 for physical  assistance     General bed mobility comments: rolling R<>L for placement of maximove pad while in supine reclined position in recliner;modA+2 to sit upright in recliner for further management of pad;minA+2 for rolling upon return to bed to remove maximove pad and reposition chuck pads  Transfers Overall transfer level: Needs assistance               General transfer comment: maximove to return to bed due to malfunction of maxisky, +4 to assist with BLE management and equipment management for proper fit around bed;due to poor fit of lift and bed, pt required return to sitting and +4 to pivot on buttocks for return to long sitting/supine in bed;pt assisting with maximove transfer by pulling trunk to upright position using bars;    Balance Overall balance assessment: Needs assistance;History of Falls Sitting-balance support: Bilateral upper extremity supported Sitting balance-Leahy Scale: Poor Sitting balance - Comments: heavy reliance on BUE support on bed rails to maintain sitting in long sitting in recliner                                   ADL either performed or assessed with clinical judgement   ADL Overall ADL's : Needs assistance/impaired                         Toilet Transfer: Minimal assistance;+2 for physical assistance;+2 for safety/equipment Toilet Transfer Details (indicate cue type and reason): rolling R<>L in bed Toileting- Clothing Manipulation and Hygiene: Total assistance Toileting - Clothing Manipulation Details (indicate cue type  and reason): totalA for posterior care       General ADL Comments: pt required use of maximove to return to bed, maxisky malfunction     Vision       Perception     Praxis      Cognition Arousal/Alertness: Awake/alert Behavior During Therapy: WFL for tasks assessed/performed Overall Cognitive Status: Within Functional Limits for tasks assessed                                 General  Comments: Highly motivated to move from bed to recliner this session.  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, Designer, industrial/product    Pertinent Vitals/ Pain       Pain Assessment: Faces Faces Pain Scale: Hurts even more Pain Location: No pain at rest; grimacing with certain aspects of movement Pain Descriptors / Indicators: Guarding;Grimacing;Sore Pain Intervention(s): Limited activity within patient's tolerance;Monitored during session  Home Living                                          Prior Functioning/Environment              Frequency  Min 2X/week        Progress Toward Goals  OT Goals(current goals can now be found in the care plan section)  Progress towards OT goals: Progressing toward goals  Acute Rehab OT Goals Patient Stated Goal: To go to rehab and get stronger. OT Goal Formulation: With patient Time For Goal Achievement: 11/14/19 Potential to Achieve Goals: Good ADL Goals Pt Will Perform Grooming: with modified independence;sitting Pt Will Perform Lower Body Dressing: with min assist;sitting/lateral leans;with adaptive equipment Pt Will Transfer to Toilet: with min assist;anterior/posterior transfer;with +2 assist  Plan Discharge plan remains appropriate    Co-evaluation    PT/OT/SLP Co-Evaluation/Treatment: Yes Reason for Co-Treatment: Complexity of the patient's impairments (multi-system involvement);For patient/therapist safety;To address functional/ADL transfers   OT goals addressed during session: ADL's and self-care      AM-PAC OT "6 Clicks" Daily Activity     Outcome Measure   Help from another person eating meals?: A Little Help from another person taking care of personal grooming?: A Little Help from another person toileting, which includes using toliet, bedpan, or urinal?: A Lot Help from another person  bathing (including washing, rinsing, drying)?: A Lot Help from another person to put on and taking off regular upper body clothing?: A Little Help from another person to put on and taking off regular lower body clothing?: A Lot 6 Click Score: 15    End of Session Equipment Utilized During Treatment: Right knee immobilizer;Left knee immobilizer(maximove)  OT Visit Diagnosis: Other abnormalities of gait and mobility (R26.89);History of falling (Z91.81);Muscle weakness (generalized) (M62.81)   Activity Tolerance Patient tolerated treatment well   Patient Left in bed;with call bell/phone within reach;with nursing/sitter in room   Nurse Communication Mobility status        Time: 1526-1550 OT Time Calculation (min): 24 min  Charges: OT General Charges $OT Visit: 1 Visit OT Treatments $Self Care/Home Management : 8-22 mins  Rosey Bath OTR/L Acute Rehabilitation Services Office: [phone]    Rebeca Alert  11/07/2019, 4:27 PM

## 2019-11-07 NOTE — Discharge Summary (Addendum)
Physician Discharge Summary  Douglas [name] ZOX:[id number] DOB: [birth year] DOA: 10/20/2019  PCP: Toma Deiters, MD  Admit date: 10/20/2019 Discharge date: 11/07/2019  Admitted From: Home Disposition: Inpatient rehab   Recommendations for Outpatient Follow-up:  1. Follow up with PCP in 1-2 weeks 2. He will go to CIR, will check blood pressure and resume antihypertensive medications as tolerated.  Home Health:No Equipment/Devices:None  Discharge Condition:Stable CODE STATUS:Full Diet recommendation: Heart Healthy  Brief/Interim Summary: 49 y.o. male past medical history of diabetes mellitus, essential hypertension, paroxysmal atrial fibrillation on Eliquis who presents to the hospital after mechanical fall which occurred several weeks ago in the ED he was found to have an SBO and in acute renal failure NG tube was placed was seen by surgery for suspicious of ileus which is now resolved.  Discharge Diagnoses:  Active Problems:   Diabetes mellitus without complication (HCC)   CHF (congestive heart failure) (HCC)   Gout   Hypertension   Sleep apnea   Atrial fibrillation (HCC)   Rupture of left quadriceps tendon   Rupture of patellar tendon, right, initial encounter   AKI (acute kidney injury) (HCC)   Knee pain  ATN: Due to hypotension ARB diuretic and NSAIDs which were discontinued on admission. He required CRRT 10/22/2019 through 10/26/2019.  His creatinine improved drastically. He will be restarted on his ARB in 2 to 4 weeks once his blood pressure can tolerate it.  Shock multifactorial in the setting of hypotension and questionable UTI: He completed a 7-day course of IV antibiotics.  Ileus: Likely in the setting of organ failure resolved with conservative management.  Hypokalemia/hypomagnesemia: These were repleted orally and resolved.  Paroxysmal atrial fibrillation: With a chads vas score greater than 2, no changes made to his medication continue metoprolol and  diltiazem and Eliquis.  Chronic heart failure: He was continue metoprolol, his Lasix was held and he was restarted, he is continued on hydralazine, his ARB will be started in 2 to 4 weeks once his blood pressure can tolerate it.  Essential hypertension: Currently on metoprolol, Lasix, diltiazem and hydralazine. His blood pressure has tolerated this well, we will restart his ARB in 2 to 4 weeks once his blood pressure can tolerate it.  He needs to be off his ARB for 2 weeks as he had ATN in the setting of hypotension, UTI and ARB.  Diabetes mellitus type 2: Can go back on his Metformin no change made to his medication.  Knee injury with left high-grade quadriceps tear and medial with articular tear and possible lateral meniscus tear with right complete patella tendon rupture: Orthopedic surgery was consulted they recommended surgical intervention performed on 11/01/2019, orthopedic recommended nonweightbearing on both legs, to keep knee extension for surgery 6 weeks, orthopedic surgery will follow up in 7 to 10 days. Physical therapy evaluated the patient and recommended inpatient rehab.  Discharge Instructions  Discharge Instructions    Diet - low sodium heart healthy   Complete by: As directed    Increase activity slowly   Complete by: As directed      Allergies as of 11/07/2019      Reactions   Benazepril Anaphylaxis   Betadine [povidone Iodine]    rash   Prednisone Other (See Comments)   Headache.      Medication List    STOP taking these medications   POTASSIUM PO     TAKE these medications   allopurinol 300 MG tablet Commonly known as: ZYLOPRIM Take 300 mg by mouth  daily.   atorvastatin 20 MG tablet Commonly known as: LIPITOR Take 1 tablet by mouth daily.   diltiazem 360 MG 24 hr capsule Commonly known as: CARDIZEM CD Take 360 mg by mouth daily.   Eliquis 5 MG Tabs tablet Generic drug: apixaban Take 5 mg by mouth 2 (two) times daily.   fluticasone 50  MCG/ACT nasal spray Commonly known as: FLONASE Place 1 spray into both nostrils daily as needed.   Fluticasone-Salmeterol 250-50 MCG/DOSE Aepb Commonly known as: ADVAIR Inhale 1 puff into the lungs 2 (two) times daily as needed for shortness of breath or wheezing.   furosemide 40 MG tablet Commonly known as: LASIX Take 40 mg by mouth daily.   HM Fexofenadine HCl 180 MG tablet Generic drug: fexofenadine Take 180 mg by mouth daily as needed.   hydrALAZINE 50 MG tablet Commonly known as: APRESOLINE Take 1 tablet (50 mg total) by mouth 2 (two) times daily. Start taking on: November 23, 2019 What changed: These instructions start on November 23, 2019. If you are unsure what to do until then, ask your doctor or other care provider.   losartan 100 MG tablet Commonly known as: COZAAR Take 1 tablet (100 mg total) by mouth daily. Start taking on: November 18, 2019 What changed: These instructions start on November 18, 2019. If you are unsure what to do until then, ask your doctor or other care provider.   metFORMIN 1000 MG tablet Commonly known as: GLUCOPHAGE Take 1,000 mg by mouth daily.   metoprolol succinate 50 MG 24 hr tablet Commonly known as: TOPROL-XL Take 100 mg by mouth daily.   montelukast 10 MG tablet Commonly known as: SINGULAIR Take 10 mg by mouth daily as needed.   Omega-3 1000 MG Caps Take 1 capsule by mouth daily.   omeprazole 20 MG capsule Commonly known as: PRILOSEC Take 20 mg by mouth daily.   sertraline 50 MG tablet Commonly known as: ZOLOFT Take 50 mg by mouth daily.       Allergies  Allergen Reactions  . Benazepril Anaphylaxis  . Betadine [Povidone Iodine]     rash  . Prednisone Other (See Comments)    Headache.    Consultations:  Orthopedic surgery   Procedures/Studies: CT ABDOMEN PELVIS WO CONTRAST  Result Date: 10/21/2019 CLINICAL DATA:  Abdominal bloating and distension. Inpatient. EXAM: CT ABDOMEN AND PELVIS WITHOUT CONTRAST TECHNIQUE: Multidetector  CT imaging of the abdomen and pelvis was performed following the standard protocol without IV contrast. COMPARISON:  Abdominal radiograph from earlier today. 06/27/2013 CT abdomen/pelvis. FINDINGS: Lower chest: Solid 1.3 cm right lower lobe pulmonary nodule (series 5/image 5), stable since 03/29/2019 chest CT. Circumferential mild wall thickening in the lower thoracic esophagus. Hepatobiliary: Normal liver size. No liver mass. Normal gallbladder with no radiopaque cholelithiasis. No biliary ductal dilatation. Pancreas: Normal, with no mass or duct dilation. Spleen: Normal size. No mass. Adrenals/Urinary Tract: Normal adrenals. No renal stones. No hydronephrosis. No contour deforming renal masses. Bladder collapsed by indwelling Foley catheter. Stomach/Bowel: Stomach moderately distended with large air-fluid level. No gastric wall thickening or pneumatosis. Enteric tube terminates in the body of the stomach. There is moderate diffuse dilatation of the proximal to mid small bowel up to 5.5 cm diameter. Air-fluid levels are scattered throughout the dilated small bowel loops. Distal and terminal ileum are collapsed. No discrete small bowel caliber transition. No small bowel wall thickening or pneumatosis. Normal appendix. Moderate gaseous distention of the large bowel, most prominent in the right and transverse colon.  Scattered small air-fluid levels in the large bowel. No large bowel wall thickening, diverticulosis or significant pericolonic fat stranding. Vascular/Lymphatic: Normal caliber abdominal aorta. No pathologically enlarged lymph nodes in the abdomen or pelvis. Reproductive: Normal size prostate with nonspecific internal prostatic calcification. Other: No pneumoperitoneum, ascites or focal fluid collection. Musculoskeletal: No aggressive appearing focal osseous lesions. Moderate thoracolumbar spondylosis. IMPRESSION: 1. CT findings are most suggestive of a severe diffuse adynamic ileus given widespread small  and large bowel dilatation with air-fluid levels. The possibility of a partial mechanical mid to distal small bowel obstruction is not excluded given the relatively collapsed distal small bowel. No free air. No bowel wall thickening or pneumatosis. 2. Enteric tube terminates in the body of the stomach. 3. Circumferential mild wall thickening in the lower thoracic esophagus, nonspecific, potentially due to reflux esophagitis, with Barrett's esophagus or neoplasm not excluded. Upper endoscopic correlation may be considered as clinically warranted. 4. Solid 1.3 cm right lower lobe pulmonary nodule, stable since 03/29/2019 chest CT. Please see the 03/29/2019 chest CT report for commentary and follow-up recommendations. 5. Aortic Atherosclerosis (ICD10-I70.0). Electronically Signed   By: Delbert Phenix M.D.   On: 10/21/2019 18:41   DG Chest 2 View  Result Date: 10/21/2019 CLINICAL DATA:  49 year old male with renal failure. EXAM: CHEST - 2 VIEW COMPARISON:  Chest radiograph dated 01/26/2019. FINDINGS: Shallow inspiration. No focal consolidation, pleural effusion, pneumothorax. Mild cardiomegaly. No acute osseous pathology. IMPRESSION: 1. No active cardiopulmonary disease. 2. Mild cardiomegaly. Electronically Signed   By: Elgie Collard M.D.   On: 10/21/2019 00:45   DG Abd 1 View  Result Date: 10/21/2019 CLINICAL DATA:  NG tube placement EXAM: ABDOMEN - 1 VIEW COMPARISON:  Oct 21, 2019 FINDINGS: The NG tube appears to terminate over the stomach. The side hole may be at the level of the GE junction. Distended loops of small bowel are again noted. The stomach is distended. IMPRESSION: 1. Enteric tube tip projects over the gastric body. Consider advancing the tube further into the stomach as the side hole is likely at the level of the GE junction. 2. Distended stomach and loops of small bowel are noted in the upper abdomen. Electronically Signed   By: Katherine Mantle M.D.   On: 10/21/2019 15:53   DG Abd 1  View  Result Date: 10/21/2019 CLINICAL DATA:  NG tube placement EXAM: ABDOMEN - 1 VIEW COMPARISON:  None. FINDINGS: There is no nasogastric tube identified. There is gaseous distension of the small bowel and colon. There is no evidence of pneumoperitoneum, portal venous gas or pneumatosis. There are no pathologic calcifications along the expected course of the ureters. The osseous structures are unremarkable. IMPRESSION: No nasogastric tube identified. Electronically Signed   By: Elige Ko   On: 10/21/2019 12:53   US RENAL  Result Date: 10/22/2019 CLINICAL DATA:  Acute kidney injury EXAM: RENAL / URINARY TRACT ULTRASOUND COMPLETE COMPARISON:  Oct 21, 2019 FINDINGS: Right Kidney: Renal measurements: 9.2 x 3.9 x 5.1 cm = volume: 95 mL . Echogenicity within normal limits. No mass or hydronephrosis visualized. Left Kidney: Renal measurements: 8.1 x 5.5 x 5 = volume: 116 mL. Echogenicity within normal limits. No mass or hydronephrosis visualized. Bladder: The bladder is decompressed with a Foley catheter which limits evaluation. Other: Overall evaluation of the kidneys was limited by patient body habitus. IMPRESSION: 1. Habitus limited study. 2. No definite acute abnormality given the above limitation. No evidence for hydronephrosis. 3. Bladder decompressed with a Foley catheter which limits  evaluation. Electronically Signed   By: Katherine Mantlehristopher  Green M.D.   On: 10/22/2019 16:48   MR KNEE RIGHT WO CONTRAST  Result Date: 10/20/2019 CLINICAL DATA:  Recent right knee trauma, inability to stand EXAM: MRI OF THE RIGHT KNEE WITHOUT CONTRAST TECHNIQUE: Multiplanar, multisequence MR imaging of the knee was performed. No intravenous contrast was administered. COMPARISON:  10/01/2019 FINDINGS: MENISCI Medial meniscus: Diffuse degenerative changes are noted, with maceration of the posterior horn and body of the medial meniscus. There is a focal tear of the undersurface of the medial meniscus at the junction of the body and  posterior horn. 1 cm parameniscal cyst along the posterior horn of the medial meniscus. Lateral meniscus:  No evidence of focal meniscal tear. LIGAMENTS Cruciates: Thickening of the ACL at the insertion on the tibia with intrasubstance increased T2 signal consistent with partial tear or chronic degenerative change. PCL is intact with normal signal. Collaterals: Medial and lateral collateral ligament complexes appear intact. CARTILAGE Patellofemoral: Irregularity and thinning of the patellar cartilage at its apex and along the lateral patellar facet. Medial: Marked thinning of the articular cartilage in the medial compartment, more pronounced along the tibial aspect. Lateral: Mild articular cartilage thinning within the lateral compartment. Joint: There is a moderate suprapatellar joint effusion. Thickening of the plica is noted. There is abnormal edema within the Hoffa fat pad, with evidence of complete tear of the patellar tendon. Popliteal Fossa:  No Baker cyst. Intact popliteus tendon. Extensor Mechanism: There is a complete full-thickness tear of the patellar tendon with retraction. No evidence of avulsion fracture. The quadriceps tendon appears intact. Bones: No focal marrow signal abnormality. No fracture or dislocation. Other: There is diffuse subcutaneous edema most pronounced within the prepatellar and infrapatellar regions. IMPRESSION: 1. Full thickness full width tear of the patellar tendon with retraction. No evidence of patellar avulsion fracture. 2. Degenerative changes within the medial compartment of the knee, with associated meniscal tear. 3. Moderate joint effusion, with diffuse subcutaneous edema as above. 4. Thickening and intrasubstance edema within the ACL, consistent with partial tear or mucoid degeneration. Electronically Signed   By: Sharlet SalinaMichael  Brown M.D.   On: 10/20/2019 21:53   MR KNEE LEFT WO CONTRAST  Result Date: 10/20/2019 CLINICAL DATA:  Larey SeatFell, knee injury, abnormal quadriceps tendon  on previous CT EXAM: MRI OF THE LEFT KNEE WITHOUT CONTRAST TECHNIQUE: Multiplanar, multisequence MR imaging of the knee was performed. No intravenous contrast was administered. COMPARISON:  10/01/2019 FINDINGS: MENISCI Medial meniscus:  Degenerative change without focal meniscal tear. Lateral meniscus: There may be a bucket-handle type tear of the lateral meniscus, with flipped fragment in the posteromedial aspect of the lateral compartment of the left knee. LIGAMENTS Cruciates: Thickening of the ACL without evidence of tear, likely related to chronic degenerative change. PCL is intact with normal signal characteristics. Collaterals: Fibers are indistinct at the femoral attachment of the medial collateral ligament, consistent with partial tear or strain. LCL complex is intact. CARTILAGE Patellofemoral:  Mild chondromalacia at the patellar apex. Medial: Mild thinning of the articular cartilage in the medial compartment without focal defect. Lateral:  No chondral defect. Joint: There is a moderate suprapatellar joint effusion with plical thickening. Lateral subluxation of the patella within the trochlear groove. Likely disruption of the fibers of the medial patellar retinaculum at their femoral insertion. Popliteal Fossa:  No Baker cyst. Intact popliteus tendon. Extensor Mechanism: There is a full-thickness tear through the medial fibers of the quadriceps tendon mechanism. The lateral fibers appear intact. Greater than  50% of the with of the quadriceps tendon is disrupted. The patellar tendon is redundant and retracted but intact. Bones: No focal marrow signal abnormality. No fracture or dislocation. Other: None. IMPRESSION: 1. Full-thickness tear through the medial fibers of the quadriceps tendon mechanism. Greater than 50% of the with of the quadriceps tendon disrupted. 2. Lateral subluxation of the patella within the trochlear groove, with disruption of fibers at the femoral insertion of the medial patellar  retinaculum. 3. Possible bucket-handle type tear of the lateral meniscus, with flipped fragment in the posteromedial aspect of the lateral compartment of the left knee. 4. Partial tear or strain of the femoral attachment of the medial collateral ligament. 5. Moderate suprapatellar joint effusion with plical thickening. 6. Thickening of the ACL without evidence of tear, likely related to chronic degenerative change. Electronically Signed   By: Sharlet Salina M.D.   On: 10/20/2019 22:31   DG Chest Port 1 View  Result Date: 10/22/2019 CLINICAL DATA:  Encounter for central line placement. EXAM: PORTABLE CHEST 1 VIEW COMPARISON:  Oct 21, 2019 FINDINGS: There is a newly placed non tunneled dialysis catheter on the right. The tip terminates over the cavoatrial junction. The enteric tube tip is not visualized on this study. The lung volumes are low. The heart size is enlarged. There is vascular congestion with pulmonary edema. There is no pneumothorax. IMPRESSION: 1. Well-positioned right-sided non tunneled dialysis catheter. No pneumothorax. 2. Cardiomegaly with persistent pulmonary edema. 3. Low lung volumes. Electronically Signed   By: Katherine Mantle M.D.   On: 10/22/2019 18:04   DG Abd 2 Views  Result Date: 10/21/2019 CLINICAL DATA:  Abdominal distention. EXAM: ABDOMEN - 2 VIEW COMPARISON:  10/16/2019. FINDINGS: Abdomen is incompletely imaged. Multiple distended loops of small and large bowel are noted. Bowel obstruction or adynamic ileus could present in this fashion. Follow-up exam suggested to demonstrate resolution. No free air identified. IMPRESSION: Multiple distended loops of small large bowel noted. Bowel obstruction or adynamic ileus could present this fashion. Follow-up exam suggested to demonstrate resolution. Electronically Signed   By: Maisie Fus  Register   On: 10/21/2019 10:39   DG Abd Portable 1V  Result Date: 10/24/2019 CLINICAL DATA:  Nasogastric tube present. EXAM: PORTABLE ABDOMEN - 1 VIEW  COMPARISON:  Oct 21, 2019. FINDINGS: Continued small bowel dilatation is noted. Distal tip of nasogastric tube is seen in proximal stomach. No radio-opaque calculi or other significant radiographic abnormality are seen. IMPRESSION: Distal tip of nasogastric tube seen in proximal stomach. Continued small bowel dilatation is noted concerning for distal small bowel obstruction or ileus. Electronically Signed   By: Lupita Raider M.D.   On: 10/24/2019 08:09   DG Abd Portable 1V  Result Date: 10/21/2019 CLINICAL DATA:  Abdominal pain. Non bilious vomiting. EXAM: PORTABLE ABDOMEN - 1 VIEW COMPARISON:  None. FINDINGS: Mild gaseous distention of large and small bowel is present. There is some gaseous distention the stomach. Definite free air is present. No obstruction is evident. Gas is seen into the distal colon. Changes are present spine. IMPRESSION: Mild gaseous distention of large and small bowel without obstruction. This may represent ileus. Electronically Signed   By: Marin Roberts M.D.   On: 10/21/2019 04:17   ECHOCARDIOGRAM COMPLETE  Result Date: 10/22/2019    ECHOCARDIOGRAM REPORT   Patient Name:   Douglas [name] [facility] Date of Exam: 10/22/2019 Medical Rec #:  [id number]              Height:  72.0 in Accession #:    [id number]             Weight:       410.0 lb Date of Birth:  12/09/1970              BSA:          2.891 m Patient Age:    48 years               BP:           87/54 mmHg Patient Gender: M                      HR:           114 bpm. Exam Location:  Inpatient Procedure: 2D Echo, Cardiac Doppler and Color Doppler Indications:    Dyspnea  History:        Patient has no prior history of Echocardiogram examinations.                 Signs/Symptoms:Dyspnea; Risk Factors:Diabetes, Hypertension and                 Morbid obesity.  Sonographer:    Lavenia Atlas Referring Phys: Herminio Heads  Sonographer Comments: No apical window, no subcostal window and patient is morbidly obese. Image  acquisition challenging due to patient body habitus. IMPRESSIONS  1. Technically difficult echo with poor image quality.  2. Left ventricular ejection fraction, by estimation, is 50 to 55%. The left ventricle has low normal function. The left ventricle has no regional wall motion abnormalities. There is moderate left ventricular hypertrophy. Left ventricular diastolic function could not be evaluated.  3. Right ventricular systolic function was not well visualized. The right ventricular size is not well visualized.  4. The mitral valve is grossly normal. No evidence of mitral valve regurgitation. No evidence of mitral stenosis.  5. The aortic valve is grossly normal. Aortic valve regurgitation is not visualized. No aortic stenosis is present. FINDINGS  Left Ventricle: Left ventricular ejection fraction, by estimation, is 50 to 55%. The left ventricle has low normal function. The left ventricle has no regional wall motion abnormalities. The left ventricular internal cavity size was normal in size. There is moderate left ventricular hypertrophy. Left ventricular diastolic function could not be evaluated. Right Ventricle: The right ventricular size is not well visualized. Right vetricular wall thickness was not assessed. Right ventricular systolic function was not well visualized. Left Atrium: Left atrial size was normal in size. Right Atrium: Right atrial size was not well visualized. Pericardium: There is no evidence of pericardial effusion. Mitral Valve: The mitral valve is grossly normal. No evidence of mitral valve regurgitation. No evidence of mitral valve stenosis. Tricuspid Valve: The tricuspid valve is not well visualized. Tricuspid valve regurgitation is not demonstrated. Aortic Valve: The aortic valve is grossly normal. Aortic valve regurgitation is not visualized. No aortic stenosis is present. Pulmonic Valve: The pulmonic valve was normal in structure. Pulmonic valve regurgitation is not visualized. Aorta:  The aortic root and ascending aorta are structurally normal, with no evidence of dilitation. IAS/Shunts: The interatrial septum was not well visualized. Additional Comments: Technically difficult echo with poor image quality.  LEFT VENTRICLE PLAX 2D LVIDd:         4.95 cm LVIDs:         3.76 cm LV PW:         1.48 cm LV IVS:  1.30 cm LVOT diam:     2.60 cm LVOT Area:     5.31 cm  LEFT ATRIUM         Index LA diam:    4.00 cm 1.38 cm/m   AORTA Ao Root diam: 2.70 cm  SHUNTS Systemic Diam: 2.60 cm Kristeen Miss MD Electronically signed by Kristeen Miss MD Signature Date/Time: 10/22/2019/3:28:20 PM    Final    VAS [place] LOWER EXTREMITY VENOUS (DVT)  Result Date: 10/21/2019  Lower Venous DVTStudy Indications: Edema.  Risk Factors: None identified. Limitations: Body habitus and poor ultrasound/tissue interface. Comparison Study: No prior studies. Performing Technologist: Chanda Busing RVT  Examination Guidelines: A complete evaluation includes B-mode imaging, spectral Doppler, color Doppler, and power Doppler as needed of all accessible portions of each vessel. Bilateral testing is considered an integral part of a complete examination. Limited examinations for reoccurring indications may be performed as noted. The reflux portion of the exam is performed with the patient in reverse Trendelenburg.  +---------+---------------+---------+-----------+----------+--------------+ RIGHT    CompressibilityPhasicitySpontaneityPropertiesThrombus Aging +---------+---------------+---------+-----------+----------+--------------+ CFV      Full           Yes      Yes                                 +---------+---------------+---------+-----------+----------+--------------+ SFJ      Full                                                        +---------+---------------+---------+-----------+----------+--------------+ FV Prox  Full                                                         +---------+---------------+---------+-----------+----------+--------------+ FV Mid   Full                                                        +---------+---------------+---------+-----------+----------+--------------+ FV DistalFull                                                        +---------+---------------+---------+-----------+----------+--------------+ PFV                                                   Not visualized +---------+---------------+---------+-----------+----------+--------------+ POP      Full           Yes      Yes                                 +---------+---------------+---------+-----------+----------+--------------+ PTV      Full                                                        +---------+---------------+---------+-----------+----------+--------------+  PERO     Full                                                        +---------+---------------+---------+-----------+----------+--------------+   +---------+---------------+---------+-----------+----------+--------------+ LEFT     CompressibilityPhasicitySpontaneityPropertiesThrombus Aging +---------+---------------+---------+-----------+----------+--------------+ CFV      Full           Yes      Yes                                 +---------+---------------+---------+-----------+----------+--------------+ SFJ      Full                                                        +---------+---------------+---------+-----------+----------+--------------+ FV Prox  Full                                                        +---------+---------------+---------+-----------+----------+--------------+ FV Mid   Full                                                        +---------+---------------+---------+-----------+----------+--------------+ FV DistalFull                                                         +---------+---------------+---------+-----------+----------+--------------+ PFV                                                   Not visualized +---------+---------------+---------+-----------+----------+--------------+ POP      Full           Yes      Yes                                 +---------+---------------+---------+-----------+----------+--------------+ PTV      Full                                                        +---------+---------------+---------+-----------+----------+--------------+ PERO     Full                                                        +---------+---------------+---------+-----------+----------+--------------+  Summary: RIGHT: - There is no evidence of deep vein thrombosis in the lower extremity. However, portions of this examination were limited- see technologist comments above.  - No cystic structure found in the popliteal fossa.  LEFT: - There is no evidence of deep vein thrombosis in the lower extremity. However, portions of this examination were limited- see technologist comments above.  - No cystic structure found in the popliteal fossa.  *See table(s) above for measurements and observations. Electronically signed by Waverly Ferrari MD on 10/21/2019 at 7:24:15 PM.    Final     (Echo, Carotid, EGD, Colonoscopy, ERCP)    Subjective: No new complaints feels great.  Discharge Exam: Vitals:   11/07/19 0828 11/07/19 0834  BP: 105/66   Pulse: 93   Resp: 14   Temp: 98.2 F (36.8 C)   SpO2: 98% 98%   Vitals:   11/06/19 2144 11/07/19 0340 11/07/19 0828 11/07/19 0834  BP:  110/89 105/66   Pulse:  79 93   Resp:  14 14   Temp:  98 F (36.7 C) 98.2 F (36.8 C)   TempSrc:  Oral Oral   SpO2: 96% 99% 98% 98%  Weight:      Height:        General: Pt is alert, awake, not in acute distress Cardiovascular: RRR, S1/S2 +, no rubs, no gallops Respiratory: CTA bilaterally, no wheezing, no rhonchi Abdominal: Soft, NT, ND, bowel  sounds + Extremities: no edema, no cyanosis    The results of significant diagnostics from this hospitalization (including imaging, microbiology, ancillary and laboratory) are listed below for reference.     Microbiology: No results found for this or any previous visit (from the past 240 hour(s)).   Labs: BNP (last 3 results) Recent Labs    10/21/19 0050  BNP 49.3   Basic Metabolic Panel: Recent Labs  Lab 11/01/19 0625 11/01/19 0625 11/02/19 0259 11/03/19 0207 11/04/19 0623 11/05/19 0405 11/06/19 0255  NA 142  --  139 142 140  --  137  K 3.6  --  3.9 4.1 4.1  --  4.2  CL 110  --  109 110 107  --  103  CO2 24  --  --  26  GLUCOSE 103*  --  193* 106* 96  --  98  BUN 12  --  --  13  CREATININE 0.81  --  0.82 0.84 0.73  --  0.83  CALCIUM 9.1  --  8.9 8.7* 8.8*  --  8.9  MG 1.6*   < > 1.6* 1.7 1.6* 1.5* 1.7  PHOS 3.4  --  4.1 3.6  --   --   --    < > = values in this interval not displayed.   Liver Function Tests: Recent Labs  Lab 11/01/19 0625 11/02/19 0259 11/03/19 0207  ALBUMIN 2.5* 2.5* 2.4*   No results for input(s): LIPASE, AMYLASE in the last 168 hours. No results for input(s): AMMONIA in the last 168 hours. CBC: Recent Labs  Lab 11/01/19 0625 11/05/19 0405  WBC 9.9 9.0  HGB 10.1* 9.2*  HCT 32.7* 30.4*  MCV 91.1 92.1  PLT 311 402*   Cardiac Enzymes: No results for input(s): CKTOTAL, CKMB, CKMBINDEX, TROPONINI in the last 168 hours. BNP: Invalid input(s): POCBNP CBG: Recent Labs  Lab 11/05/19 2356 11/06/19 0602 11/06/19 1155 11/06/19 1823 11/07/19 0607  GLUCAP 92 100* 92 103* 109*   D-Dimer No results for input(s):  DDIMER in the last 72 hours. Hgb A1c No results for input(s): HGBA1C in the last 72 hours. Lipid Profile No results for input(s): CHOL, HDL, LDLCALC, TRIG, CHOLHDL, LDLDIRECT in the last 72 hours. Thyroid function studies No results for input(s): TSH, T4TOTAL, T3FREE, THYROIDAB in the last 72  hours.  Invalid input(s): FREET3 Anemia work up No results for input(s): VITAMINB12, FOLATE, FERRITIN, TIBC, IRON, RETICCTPCT in the last 72 hours. Urinalysis    Component Value Date/Time   COLORURINE BROWN (A) 10/22/2019 1200   APPEARANCEUR CLOUDY (A) 10/22/2019 1200   LABSPEC 1.025 10/22/2019 1200   PHURINE 5.0 10/22/2019 1200   GLUCOSEU NEGATIVE 10/22/2019 1200   HGBUR LARGE (A) 10/22/2019 1200   BILIRUBINUR MODERATE (A) 10/22/2019 1200   KETONESUR 15 (A) 10/22/2019 1200   PROTEINUR >300 (A) 10/22/2019 1200   NITRITE POSITIVE (A) 10/22/2019 1200   LEUKOCYTESUR MODERATE (A) 10/22/2019 1200   Sepsis Labs Invalid input(s): PROCALCITONIN,  WBC,  LACTICIDVEN Microbiology No results found for this or any previous visit (from the past 240 hour(s)).   Time coordinating discharge: Over 30 minutes  SIGNED:   Marinda Elk, MD  [facility] 11/07/2019, 9:00 AM Pager   If 7PM-7AM, please contact night-coverage [email or web address] Password [email or web address]

## 2019-11-07 NOTE — Progress Notes (Addendum)
Physical Therapy Treatment Patient Details Name: Douglas [name] MRN: [id number] DOB: 10-Feb-1971 Today's Date: 11/07/2019    History of Present Illness 49 yo male who has not walked in four weeks was admitted, noted his pain in knees from a recent fall that led to nonambulatory status.  Pt is now in acute renal failure, had low BP, shock,  and AKI with hypotension, acute renal failure.  Per MRI has L quad tear, medial retinacular tear, R patellar tendon rupture, lateral meniscal tear. s/p left Quadricep tendon repair, left knee and meniscus debridement, right knee patellar tendon repair. PMHx:  CHF, EF 50-55%, PAF, ileus, HTN    PT Comments    NT called PTA and OT to room with maxislide sling would not operate properly.  Required rolling to place and remove sling during session.  Continues to grimmace with movement but very motivated.  Assisted nursing in problem solving back to bed.  Continue to recommend aggressive CIR placement.     Follow Up Recommendations  CIR     Equipment Recommendations  Other (comment)(defer)    Recommendations for Other Services       Precautions / Restrictions Precautions Precautions: Fall Required Braces or Orthoses: Knee Immobilizer - Right;Knee Immobilizer - Left Knee Immobilizer - Right: On at all times Knee Immobilizer - Left: On at all times Restrictions Weight Bearing Restrictions: Yes RLE Weight Bearing: Non weight bearing LLE Weight Bearing: Non weight bearing Other Position/Activity Restrictions: kept pt NWB through BLE     Mobility  Bed Mobility Overal bed mobility: Needs Assistance Bed Mobility: Rolling Rolling: Min assist;+2 for physical assistance   Supine to sit: Mod assist;+2 for physical assistance Sit to supine: Mod assist;+2 for physical assistance   General bed mobility comments: Pt required assistance to flat spin on his bottom for back to bed and once in supine rolling to clean patient of bowel incontinence, removed  soiled pad and maxi sling and replace news pads.  Transfers Overall transfer level: Needs assistance Equipment used: (maximove)             General transfer comment: Pt required rolling in chair to place maxi move sling after maxi sky was not working properly.  Pt lifted into position and required B Hip abduction to move LEs into a better place for raising the lift.  Unable to place pt in supine due to B KIs keeping B LEs in extension. Therefore pt transferred to edge of bed and the performed sit to supine when lift pad was removed.    Ambulation/Gait                 Stairs             Wheelchair Mobility    Modified Rankin (Stroke Patients Only)       Balance Overall balance assessment: Needs assistance;History of Falls Sitting-balance support: Bilateral upper extremity supported Sitting balance-Leahy Scale: Poor Sitting balance - Comments: heavy reliance on BUE support on bed rails to maintain sitting in long sitting in recliner     Standing balance-Leahy Scale: Poor                              Cognition Arousal/Alertness: Awake/alert Behavior During Therapy: WFL for tasks assessed/performed Overall Cognitive Status: Within Functional Limits for tasks assessed  General Comments: Pt remains motivated to assist with back to bed.      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 Potential to Achieve Goals: Fair Progress towards PT goals: Progressing toward goals    Frequency    Min 5X/week      PT Plan Current plan remains appropriate    Co-evaluation   Reason for Co-Treatment: Complexity of the patient's impairments (multi-system involvement);For patient/therapist safety;To address functional/ADL transfers   OT goals addressed during session: ADL's and self-care      AM-PAC PT "6 Clicks" Mobility   Outcome Measure  Help needed turning from your back to your side while in a flat bed without using bedrails?: A Little Help needed moving from lying on your back to sitting on the side of a flat bed without using bedrails?: A Lot Help needed moving to and from a bed to a chair (including a wheelchair)?: A Lot Help needed standing up from a chair using your arms (e.g., wheelchair or bedside chair)?: Total Help needed to walk in hospital room?: Total Help needed climbing 3-5 steps with a railing? : Total 6 Click Score: 10    End of Session Equipment Utilized During Treatment: (maximove lift and sling) Activity Tolerance: Patient tolerated treatment well Patient left: in bed;with call bell/phone within reach;with nursing/sitter in room Nurse Communication: Mobility status PT Visit Diagnosis: Muscle weakness (generalized) (M62.81);History of falling (Z91.81)     Time: 9983-3825 PT Time Calculation (min) (ACUTE ONLY): 24 min  Charges:  $Therapeutic Activity: 8-22 mins                     Bonney Leitz , PTA Acute Rehabilitation Services Pager [phone] Office [phone]     Ursula Dermody Artis Delay 11/07/2019, 4:44 PM

## 2019-11-07 NOTE — Plan of Care (Signed)
  Problem: Education: Goal: Knowledge of General Education information will improve Description: Including pain rating scale, medication(s)/side effects and non-pharmacologic comfort measures Outcome: Progressing   Problem: Activity: Goal: Risk for activity intolerance will decrease Outcome: Progressing   

## 2019-11-07 NOTE — Progress Notes (Signed)
Occupational Therapy Treatment Patient Details Name: Douglas [name] MRN: [id number] DOB: Oct 26, 1970 Today's Date: 11/07/2019    History of present illness 49 yo male who has not walked in four weeks was admitted, noted his pain in knees from a recent fall that led to nonambulatory status.  Pt is now in acute renal failure, had low BP, shock,  and AKI with hypotension, acute renal failure.  Per MRI has L quad tear, medial retinacular tear, R patellar tendon rupture, lateral meniscal tear. s/p left Quadricep tendon repair, left knee and meniscus debridement, right knee patellar tendon repair. PMHx:  CHF, EF 50-55%, PAF, ileus, HTN   OT comments  Modified goals this date due to pt's NWB status. Pt progressing toward established OT goals. He required maxA+3 for anterior posterior transfer with maxislide from bed to recliner. He continues to remain highly motivated to participate with OT/PT. Pt will continue to benefit from skilled OT services to maximize safety and independence with ADL/IADL and functional mobility. Will continue to follow acutely and progress as tolerated.    Follow Up Recommendations  CIR    Equipment Recommendations  Wheelchair (measurements OT);Hospital bed;Other (comment)    Recommendations for Other Services      Precautions / Restrictions Precautions Precautions: Fall Required Braces or Orthoses: Knee Immobilizer - Right;Knee Immobilizer - Left Knee Immobilizer - Right: On at all times Knee Immobilizer - Left: On at all times Restrictions Weight Bearing Restrictions: Yes RLE Weight Bearing: Non weight bearing LLE Weight Bearing: Non weight bearing       Mobility Bed Mobility Overal bed mobility: Needs Assistance Bed Mobility: Rolling Rolling: Min assist;+2 for physical assistance   Supine to sit: Mod assist;+2 for physical assistance     General bed mobility comments: Rolling to right and left for placement of maxi slide pad with minA for  contralateral leg management.  Pt performed supine to sit with +3 for LE management and +2 to manage maxislide pad for rotating hips.  Transfers Overall transfer level: Needs assistance Equipment used: (maxi slide pad) Transfers: Licensed conveyancer transfers: Max assist;+2 physical assistance   General transfer comment: +3 to assist B LEs but likely will not need this next time.  Utilized maxislide sling pad to move from bed to recliner with multiple stops for rest periods.  Reports feeling dizzy in sitting but resolves with rest periods.  Pt able to follow commands for hand placement to move from bed to recliner.  Reclined back of chair for ease due to body habitus.    Balance Overall balance assessment: Needs assistance;History of Falls   Sitting balance-Leahy Scale: Poor Sitting balance - Comments: heavy reliance on BUE support on bed rails to maintain sitting in long sitting in bed.                                   ADL either performed or assessed with clinical judgement   ADL Overall ADL's : Needs assistance/impaired                         Toilet Transfer: Maximal assistance;+2 for physical assistance;+2 for safety/equipment;Anterior/posterior Statistician Details (indicate cue type and reason): with maxi slide from EOB to recliner         Functional mobility during ADLs: Maximal assistance;+2 for physical assistance;+2 for safety/equipment General ADL Comments: anterior/posterior transfer to recliner  with use of maxi slide;increased doe 3/4     Vision       Perception     Praxis      Cognition Arousal/Alertness: Awake/alert Behavior During Therapy: WFL for tasks assessed/performed Overall Cognitive Status: Within Functional Limits for tasks assessed                                 General Comments: Highly motivated to move from bed to recliner this session.  Pt very motivated to return  home to his family        Exercises     Shoulder Instructions       General Comments      Pertinent Vitals/ Pain       Pain Assessment: Faces Faces Pain Scale: Hurts even more Pain Location: No pain at rest; grimacing with certain aspects of movement Pain Descriptors / Indicators: Guarding;Grimacing;Sore Pain Intervention(s): Limited activity within patient's tolerance;Monitored during session  Home Living                                          Prior Functioning/Environment              Frequency  Min 2X/week        Progress Toward Goals  OT Goals(current goals can now be found in the care plan section)  Progress towards OT goals: Progressing toward goals;Goals drowngraded-see care plan(due to NWB status)  Acute Rehab OT Goals Patient Stated Goal: To go to rehab and get stronger. OT Goal Formulation: With patient Time For Goal Achievement: 11/14/19 Potential to Achieve Goals: Good ADL Goals Pt Will Perform Grooming: with modified independence;sitting Pt Will Perform Lower Body Dressing: with min assist;sitting/lateral leans;with adaptive equipment Pt Will Transfer to Toilet: with min assist;anterior/posterior transfer;with +2 assist  Plan Discharge plan remains appropriate    Co-evaluation    PT/OT/SLP Co-Evaluation/Treatment: Yes Reason for Co-Treatment: Complexity of the patient's impairments (multi-system involvement) PT goals addressed during session: Mobility/safety with mobility OT goals addressed during session: ADL's and self-care      AM-PAC OT "6 Clicks" Daily Activity     Outcome Measure   Help from another person eating meals?: A Little Help from another person taking care of personal grooming?: A Little Help from another person toileting, which includes using toliet, bedpan, or urinal?: A Lot Help from another person bathing (including washing, rinsing, drying)?: A Lot Help from another person to put on and taking  off regular upper body clothing?: A Little Help from another person to put on and taking off regular lower body clothing?: A Lot 6 Click Score: 15    End of Session Equipment Utilized During Treatment: Right knee immobilizer;Left knee immobilizer  OT Visit Diagnosis: Other abnormalities of gait and mobility (R26.89);History of falling (Z91.81);Muscle weakness (generalized) (M62.81)   Activity Tolerance Patient tolerated treatment well   Patient Left in chair;with call bell/phone within reach   Nurse Communication Mobility status        Time: 1443-1540 OT Time Calculation (min): 34 min  Charges: OT General Charges $OT Visit: 1 Visit OT Treatments $Self Care/Home Management : 8-22 mins  Helene Kelp OTR/L Acute Rehabilitation Services Office: [place] 11/07/2019, 12:13 PM

## 2019-11-07 NOTE — TOC Transition Note (Signed)
Transition of Care [facility]) - CM/SW Discharge Note   Patient Details  Name: Douglas [name] MRN: [id number] Date of Birth: 06-29-1970  [facility] CM/SW Contact:  Epifanio Lesches, RN Phone Number: [phone] 11/07/2019, 10:12 AM   Clinical Narrative:     Patient will DC to: SNF/  Anticipated DC date: 10/16/2019 Family notified: wife  Per MD patient ready for DC today to SNF/[facility]HC. RN, patient, patient's family aware of DC plan. Discharge Summary and FL2 sent to facility. RN to call report prior to discharge [phone]). Rm # [phone] P. RNCM will sign off for now as intervention is no longer needed.    Final next level of care: IP Rehab Facility Barriers to Discharge: No Barriers Identified   Patient Goals and CMS Choice        Discharge Placement   Discharge Plan and Services   Discharge Planning Services: CM Consult    Social Determinants of Health (SDOH) Interventions     Readmission Risk Interventions No flowsheet data found.

## 2019-11-08 LAB — GLUCOSE, CAPILLARY
Glucose-Capillary: 104 mg/dL — ABNORMAL HIGH (ref 70–99)
Glucose-Capillary: 104 mg/dL — ABNORMAL HIGH (ref 70–99)
Glucose-Capillary: 98 mg/dL (ref 70–99)
Glucose-Capillary: 99 mg/dL (ref 70–99)

## 2019-11-08 NOTE — Plan of Care (Signed)
  Problem: Education: Goal: Knowledge of General Education information will improve Description: Including pain rating scale, medication(s)/side effects and non-pharmacologic comfort measures Outcome: Progressing   Problem: Health Behavior/Discharge Planning: Goal: Ability to manage health-related needs will improve Outcome: Progressing   Problem: Pain Managment: Goal: General experience of comfort will improve Outcome: Progressing   

## 2019-11-08 NOTE — Progress Notes (Signed)
NCM received consult for possible SNF placement at time of discharge. NCM spoke with patient regarding CIR DENIAL and the recommendation of SNF placement at time of discharge. Patient reported that patient's spouse  is currently unable to care for patient at their home given patient's current physical needs and fall risk. Patient expressed he is agreeable to SNF placement at time of discharge. Patient reports no preference for SNF. NCM discussed insurance authorization process and provided Medicare SNF ratings list. Patient expressed being hopeful for rehab and to feel better soon. No further questions reported at this time. NCM to continue to follow and assist with discharge planning needs.

## 2019-11-08 NOTE — Progress Notes (Addendum)
Physical Therapy Treatment Patient Details Name: Douglas [name] MRN: [id number] DOB: 1971-04-06 Today's Date: 11/08/2019    History of Present Illness 49 yo male who has not walked in four weeks was admitted, noted his pain in knees from a recent fall that led to nonambulatory status.  Pt is now in acute renal failure, had low BP, shock,  and AKI with hypotension, acute renal failure.  Per MRI has L quad tear, medial retinacular tear, R patellar tendon rupture, lateral meniscal tear. s/p left Quadricep tendon repair, left knee and meniscus debridement, right knee patellar tendon repair. PMHx:  CHF, EF 50-55%, PAF, ileus, HTN    PT Comments    Pt performed rolling to L and R to place bed pan, once on pan reported he needed more time so further mobility deferred and will return later to progress OOB mobility.  Based on CIR denial will update recommendations to SNF at this time.  Will inform supervising PT of need to change recommendations at this time.  Plan return to later this pm for WC mobility.      Follow Up Recommendations  SNF     Equipment Recommendations  (defer to post acute)    Recommendations for Other Services Rehab consult     Precautions / Restrictions Precautions Precautions: Fall Required Braces or Orthoses: Knee Immobilizer - Right;Knee Immobilizer - Left Knee Immobilizer - Right: On at all times Knee Immobilizer - Left: On at all times Restrictions Weight Bearing Restrictions: Yes RLE Weight Bearing: Weight bearing as tolerated LLE Weight Bearing: Weight bearing as tolerated Other Position/Activity Restrictions: kept pt NWB through BLE     Mobility  Bed Mobility Overal bed mobility: Needs Assistance Bed Mobility: Rolling Rolling: Min assist         General bed mobility comments: Rolling to L to place bed pad and rolling back to the R.  Transfers                    Ambulation/Gait                 Stairs              Wheelchair Mobility    Modified Rankin (Stroke Patients Only)       Balance Overall balance assessment: Needs assistance;History of Falls Sitting-balance support: Bilateral upper extremity supported Sitting balance-Leahy Scale: Poor       Standing balance-Leahy Scale: Poor                              Cognition Arousal/Alertness: Awake/alert Behavior During Therapy: WFL for tasks assessed/performed Overall Cognitive Status: Within Functional Limits for tasks assessed                                        Exercises      General Comments        Pertinent Vitals/Pain Pain Assessment: Faces Faces Pain Scale: Hurts a little bit Pain Location: B LEs Pain Descriptors / Indicators: Guarding;Grimacing;Sore Pain Intervention(s): Monitored during session;Repositioned    Home Living                      Prior Function            PT Goals (current goals can now be found in the care plan section) Acute Rehab  PT Goals Patient Stated Goal: To go to rehab and get stronger. Additional Goals Additional Goal #1: Pt will perform WC mobility >125' with supervision to ensure safety with mobility. Progress towards PT goals: Progressing toward goals    Frequency    Min 5X/week      PT Plan Current plan remains appropriate    Co-evaluation              AM-PAC PT "6 Clicks" Mobility   Outcome Measure  Help needed turning from your back to your side while in a flat bed without using bedrails?: A Little Help needed moving from lying on your back to sitting on the side of a flat bed without using bedrails?: A Lot Help needed moving to and from a bed to a chair (including a wheelchair)?: A Lot Help needed standing up from a chair using your arms (e.g., wheelchair or bedside chair)?: Total Help needed to walk in hospital room?: Total Help needed climbing 3-5 steps with a railing? : Total 6 Click Score: 10    End of Session    Activity Tolerance: Patient tolerated treatment well Patient left: in bed;with call bell/phone within reach;with nursing/sitter in room Nurse Communication: Mobility status(informed nursing that he was on bed pan and would return later for PT session to progress mobility) PT Visit Diagnosis: Muscle weakness (generalized) (M62.81);History of falling (Z91.81)     Time: 1301-1310 PT Time Calculation (min) (ACUTE ONLY): 9 min  Charges:  $Therapeutic Activity: 8-22 mins                     Bonney Leitz , PTA Acute Rehabilitation Services Pager [phone] Office [phone]     Elle Vezina Artis Delay 11/08/2019, 6:21 PM

## 2019-11-08 NOTE — Progress Notes (Signed)
Inpatient Rehabilitation-Admissions Coordinator   Was notified by the patient's insurance company that they are requesting a peer to peer with the attending physician prior to making a determination for CIR. Dr. David Stall has agreed to complete the peer to peer this AM. Will update once there has been a final determination.   Please call if questions.   Cheri Rous, OTR/L  Rehab Admissions Coordinator  [phone] 11/08/2019 8:08 AM

## 2019-11-08 NOTE — Plan of Care (Signed)
  Problem: Education: Goal: Knowledge of General Education information will improve Description: Including pain rating scale, medication(s)/side effects and non-pharmacologic comfort measures Outcome: Progressing   Problem: Activity: Goal: Risk for activity intolerance will decrease Outcome: Progressing   Problem: Skin Integrity: Goal: Risk for impaired skin integrity will decrease Outcome: Progressing   Problem: Activity: Goal: Risk for activity intolerance will decrease Outcome: Progressing   Problem: Pain Managment: Goal: General experience of comfort will improve Outcome: Progressing   Problem: Safety: Goal: Ability to remain free from injury will improve Outcome: Progressing

## 2019-11-08 NOTE — Progress Notes (Signed)
Nutrition Follow-up  RD working remotely.  DOCUMENTATION CODES:   Morbid obesity  INTERVENTION:   -Continue MVI with minerals daily -Continue double protein portions with meals -Continue Ensure Max podaily, each supplement provides 150 kcal and 30 grams of protein.  NUTRITION DIAGNOSIS:   Inadequate oral intake related to altered GI function as evidenced by NPO status.  Progressing; advanced to soft diet on 10/27/19  GOAL:   Patient will meet greater than or equal to 90% of their needs  Progressing   MONITOR:   Diet advancement, Labs, Weight trends, Skin, I & O's  REASON FOR ASSESSMENT:   Malnutrition Screening Tool    ASSESSMENT:   Douglas [name] is 49 y.o. male with PMH of CHF, DM, HTN, gout who presents to ER with bilateral knee pain. Patient has been admitted to [facility] for 4 weeks for knee pain after a fall. He has been unable to bear weight since this injury. Hospital was unable to obtain MRI during this time period. Patient reports x-rays were normal.  5/8- start CRRT 5/12- CRRT d/c 5/14- advanced to soft diet 5/18- s/p Procedure(s): ChronicQUADRICEP TENDON REPAIR; ARTHROSCOPY KNEE AND MENISCUS DEBRIDEMENTleft knee; ChronicPATELLAR TENDON REPAIRright knee  Reviewed I/O's: +483 ml x 24 hours and +12.3 L since 10/25/19  UOP: 0 ml x 24 hours  Attempted to speak with pt via phone, however, no answer.  Pt continues to have good appetite. Meal completion 75-100%. Per MAR, he is taking Ensure Max supplements. Pt with increased nutritional needs secondary to post-operative healing and will require addition of nutritional supplements to meet increased needs.  Obesity is a complex, chronic medical condition that is optimally managed by a multidisciplinary care team. Weight loss is not an idealgoal for an acute inpatient hospitalization. However, if further work-up for obesity is warranted, consider outpatient referral to outpatient  bariatric service and/or 's Nutrition and Diabetes Education Services.   Medications reviewed and include cardizem and lasix.  Per chart review, pt awaiting CIR placement. MD to complete peer to peer with insurance company in attempt to obtain authorization.   Labs reviewed: CBGS: 98-115 (inpatient orders for glycemic control are 0-9 units insulin aspart every 6 hours).   Diet Order:   Diet Order            Diet - low sodium heart healthy        Diet regular Room service appropriate? No; Fluid consistency: Thin  Diet effective now              EDUCATION NEEDS:   Not appropriate for education at this time  Skin:  Skin Assessment: Skin Integrity Issues: Skin Integrity Issues:: Other (Comment) Incisions: lt knee, rt leg Other: skin tear to rt and lt buttocks; MASD to back, thigh, scrotum, groin, buttocks  Last BM:  11/08/19  Height:   Ht Readings from Last 1 Encounters:  11/01/19 6' 0.01" (1.829 m)    Weight:   Wt Readings from Last 1 Encounters:  11/01/19 (!) 182.1 kg    Ideal Body Weight:  80.9 kg  BMI:  Body mass index is 54.44 kg/m.  Estimated Nutritional Needs:   Kcal:  2300-2500 kcal  Protein:  125-160 grams  Fluid:  >/= 2.3 L/day    Levada Schilling, RD, LDN, CDCES Registered Dietitian II Certified Diabetes Care and Education Specialist Please refer to [facility] for RD and/or RD on-call/weekend/after hours pager

## 2019-11-08 NOTE — Progress Notes (Signed)
[facility] PROGRESS NOTE    Progress Note  [name]  ZOX:[id number] DOB: 1970-11-26 DOA: 10/20/2019 PCP: Toma Deiters, MD     Brief Narrative:   Douglas [name] is an 49 y.o. male past medical history of diabetes mellitus, essential hypertension, paroxysmal atrial fibrillation on Eliquis who presents to the hospital after mechanical fall which occurred several weeks ago in the ED he was found to have an SBO and in acute renal failure NG tube was placed was seen by surgery for suspicious of ileus which is now resolved.  [facility] neph[facility] was consulted and due to low blood pressure and poor urine output PCCM was also consulted patient was transferred to the intensive care for observation and likely transition to CV[facility].  Transition to IV heparin.  Assessment/Plan:   ATN due to hypotension present on admission: ARB diuretics and NSAIDs were discontinued.  This will need to be restarted in 2 weeks. CRRT on 10/22/2019 through 10/26/2019, his creatinine has improved drastically as well as his urine output. And his creatinine has improved to baseline.  Shock multifactorial in the setting of hypotension/questionable UTI: He completed 7-day course of IV antibiotics last dose on 10/29/2019, shock criteria resolved with supportive care and IV fluids.  Ileus: Resolved in the setting of renal failure tolerating his diet. Resolved.  Hypokalemia/hypomagnesemia: Potassium was low this morning replete orally, recheck basic metabolic panel.  Paroxysmal atrial fibrillation: With a chads vas score greater than 2, continue diltiazem and metoprolol, transition him to Eliquis.  Chronic diastolic heart failure: Continue metoprolol, holding Lasix, losartan restarted on 11/01/2019, blood pressure to kidney function tolerating it.  Essential hypertension: Continue hydralazine diltiazem and metoprolol.  Diabetes mellitus type 2: With an A1c of 6.8 on 10/21/2019: Continue sliding  scale insulin.  Will resume oral hypoglycemic agents as an outpatient.  Knee injury with left high-grade quadriceps tear and medial with articular tear and possible lateral meniscus tear with right complete patella tendon rupture: Orthopedic surgery was consulted and she status post quadricep tendon repair and arthroscopic knee and meniscus debridement on the left and chronic patellar tendon repair on 11/01/2019. Orthopedic surgery injury recommended nonweightbearing status in both legs, keep full extension of the knee over the next 4 to 6 weeks, follow-up with orthopedic surgery as an outpatient in 7 to 10 days. Physical therapy evaluated the patient and recommended inpatient rehab awaiting consultation patient is stable for discharge.  Morbid obesity: Lifestyle modification.   DVT prophylaxis: heparin Family Communication:none Status is: Inpatient  Remains inpatient appropriate because:IV treatments appropriate due to intensity of illness or inability to take PO   Dispo: The patient is from: Home              Anticipated d/c is to: Inpatient rehab              Anticipated d/c date is: 1 days              Patient currently is medically stable to d/c.  Insurance did not approve for inpatient rehab, they will approve for skilled nursing facility, patient is medically stable for discharge awaiting skilled nursing facility placement.   Code Status:     Code Status Orders  (From admission, onward)         Start     Ordered   10/22/19 1502  Full code  Continuous     10/22/19 1503        Code Status History    Date Active Date Inactive  Code Status Order ID Comments User Context   10/21/2019 0011 10/22/2019 1503 Full Code [id number]  Arvilla Market, DO ED   Advance Care Planning Activity        IV Access:    Peripheral IV   Procedures and diagnostic studies:   No results found.   Medical Consultants:    None.  Anti-Infectives:   none  Subjective:     Abdo Denault Gassett no complaints feels great.  Objective:    Vitals:   11/08/19 0005 11/08/19 0539 11/08/19 0835 11/08/19 0848  BP: 123/76 122/63 125/69   Pulse: 79 76 91   Resp: 16 15 18    Temp: 98.2 F (36.8 C) 97.6 F (36.4 C) 97.7 F (36.5 C)   TempSrc: Oral  Oral   SpO2: 95% 96% 98% 98%  Weight:      Height:       SpO2: 98 % O2 Flow Rate (L/min): 3 L/min FiO2 (%): 28 %   Intake/Output Summary (Last 24 hours) at 11/08/2019 0906 Last data filed at 11/08/2019 0600 Gross per 24 hour  Intake 483 ml  Output 0 ml  Net 483 ml   Filed Weights   10/27/19 0330 10/31/19 0937 11/01/19 1520  Weight: (!) 181.4 kg (!) 182.1 kg (!) 182.1 kg    Exam: General exam: In no acute distress. Respiratory system: Good air movement and clear to auscultation. Cardiovascular system: S1 & S2 heard, RRR. No JVD. Gastrointestinal system: Abdomen is nondistended, soft and nontender.  Extremities: No pedal edema. Skin: No rashes, lesions or ulcers  Data Reviewed:    Labs: Basic Metabolic Panel: Recent Labs  Lab 11/02/19 0259 11/02/19 0259 11/03/19 0207 11/03/19 0207 11/04/19 0623 11/05/19 0405 11/06/19 0255  NA 139  --  142  --  140  --  137  K 3.9   < > 4.1   < > 4.1  --  4.2  CL 109  --  110  --  107  --  103  CO2 22  --  23  --  24  --  26  GLUCOSE 193*  --  106*  --  96  --  98  BUN 10  --  10  --  8  --  13  CREATININE 0.82  --  0.84  --  0.73  --  0.83  CALCIUM 8.9  --  8.7*  --  8.8*  --  8.9  MG 1.6*  --  1.7  --  1.6* 1.5* 1.7  PHOS 4.1  --  3.6  --   --   --   --    < > = values in this interval not displayed.   GFR Estimated Creatinine Clearance: 179.8 mL/min (by C-G formula based on SCr of 0.83 mg/dL). Liver Function Tests: Recent Labs  Lab 11/02/19 0259 11/03/19 0207  ALBUMIN 2.5* 2.4*   No results for input(s): LIPASE, AMYLASE in the last 168 hours. No results for input(s): AMMONIA in the last 168 hours. Coagulation profile No results for  input(s): INR, PROTIME in the last 168 hours. COVID-19 Labs  No results for input(s): DDIMER, FERRITIN, LDH, CRP in the last 72 hours.  Lab Results  Component Value Date   SARSCOV2NAA NEGATIVE 10/20/2019   SARSCOV2NAA Not Detected 04/15/2019    CBC: Recent Labs  Lab 11/05/19 0405  WBC 9.0  HGB 9.2*  HCT 30.4*  MCV 92.1  PLT 402*   Cardiac Enzymes: No results for input(s): CKTOTAL, CKMB,  CKMBINDEX, TROPONINI in the last 168 hours. BNP (last 3 results) No results for input(s): PROBNP in the last 8760 hours. CBG: Recent Labs  Lab 11/07/19 0607 11/07/19 1143 11/07/19 1812 11/08/19 0003 11/08/19 0602  GLUCAP 109* 101* 115* 99 98   D-Dimer: No results for input(s): DDIMER in the last 72 hours. Hgb A1c: No results for input(s): HGBA1C in the last 72 hours. Lipid Profile: No results for input(s): CHOL, HDL, LDLCALC, TRIG, CHOLHDL, LDLDIRECT in the last 72 hours. Thyroid function studies: No results for input(s): TSH, T4TOTAL, T3FREE, THYROIDAB in the last 72 hours.  Invalid input(s): FREET3 Anemia work up: No results for input(s): VITAMINB12, FOLATE, FERRITIN, TIBC, IRON, RETICCTPCT in the last 72 hours. Sepsis Labs: Recent Labs  Lab 11/05/19 0405  WBC 9.0   Microbiology No results found for this or any previous visit (from the past 240 hour(s)).   Medications:   . apixaban  5 mg Oral BID  . atorvastatin  20 mg Oral Daily  . chlorhexidine  15 mL Mouth Rinse BID  . Chlorhexidine Gluconate Cloth  6 each Topical Q0600  . diltiazem  360 mg Oral Daily  . furosemide  40 mg Oral Daily  . hydrALAZINE  75 mg Oral Q6H  . insulin aspart  0-9 Units Subcutaneous Q6H  . mouth rinse  15 mL Mouth Rinse q12n4p  . metoprolol succinate  100 mg Oral Daily  . mometasone-formoterol  2 puff Inhalation BID  . multivitamin with minerals  1 tablet Oral Daily  . pantoprazole  40 mg Oral Daily  . potassium chloride  20 mEq Oral TID  . Ensure Max Protein  11 oz Oral Daily  .  sertraline  50 mg Oral Daily  . sodium chloride flush  10-40 mL Intracatheter Q12H   Continuous Infusions:    LOS: 19 days   Charlynne Cousins  [facility]  11/08/2019, 9:06 AM

## 2019-11-08 NOTE — Progress Notes (Signed)
Inpatient Rehabilitation-Admissions Coordinator   Was notified by pt's insurance company that despite peer to peer conference this AM with Dr. David Stall, the patient's request for CIR has been denied. Discussed denial with pt who understands he will need SNF placement and is open to this. MD and [facility] team aware. AC will sign off at this time.   Please call if questions.   Cheri Rous, OTR/L  Rehab Admissions Coordinator  [phone] 11/08/2019 11:24 AM

## 2019-11-08 NOTE — Progress Notes (Signed)
Physical Therapy Treatment Patient Details Name: Douglas [name] MRN: [id number] DOB: [birth year] Today's Date: 11/08/2019    History of Present Illness 49 yo male who has not walked in four weeks was admitted, noted his pain in knees from a recent fall that led to nonambulatory status.  Pt is now in acute renal failure, had low BP, shock,  and AKI with hypotension, acute renal failure.  Per MRI has L quad tear, medial retinacular tear, R patellar tendon rupture, lateral meniscal tear. s/p left Quadricep tendon repair, left knee and meniscus debridement, right knee patellar tendon repair. PMHx:  CHF, EF 50-55%, PAF, ileus, HTN    PT Comments    Pt supine in bed and eager to progress to OOB mobility.  Utilized maximove lift for OOB to [employer].  Pt able to perform multiple bouts of WC mobility with min to mod assistance.  Continue to recommend rehab in a post acute setting to maximize functional gains before returning home.     Follow Up Recommendations  SNF     Equipment Recommendations  (defer to post acute)    Recommendations for Other Services Rehab consult     Precautions / Restrictions Precautions Precautions: Fall Required Braces or Orthoses: Knee Immobilizer - Right;Knee Immobilizer - Left Knee Immobilizer - Right: On at all times Knee Immobilizer - Left: On at all times Restrictions Weight Bearing Restrictions: Yes RLE Weight Bearing: Weight bearing as tolerated LLE Weight Bearing: Weight bearing as tolerated Other Position/Activity Restrictions: kept pt NWB through BLE     Mobility  Bed Mobility Overal bed mobility: Needs Assistance Bed Mobility: Rolling Rolling: Min assist     Sit to supine: +2 for physical assistance   General bed mobility comments: Rolling to R and L to place the maximove lift.  Assistance to lift legs but does a good job managing his upper body.  Transfers Overall transfer level: Needs assistance Equipment used: (maximove)             General transfer comment: Pt required assistance to passivley move from bed to [facility] to focus on WC training. Utilized maximove for lifting OOB.  Ambulation/Gait                 Theme park manager mobility: Yes Wheelchair propulsion: Both upper extremities Wheelchair parts: Needs assistance Distance: 10 ft, 20 ft, 25 ft. 20 ft, 18 ft.  Required rest break between each trial due to fatigue.  Increased WOB noted. Wheelchair Assistance Details (indicate cue type and reason): Pt required total assistance for back and turning in tight spaces.  Min assistance for forward propulsion and moderate assistance for turning R and L.  Modified Rankin (Stroke Patients Only)       Balance Overall balance assessment: Needs assistance;History of Falls Sitting-balance support: Bilateral upper extremity supported Sitting balance-Leahy Scale: Poor Sitting balance - Comments: heavy reliance on BUE support on bed rails to maintain sitting in long sitting in recliner     Standing balance-Leahy Scale: Poor                              Cognition Arousal/Alertness: Awake/alert Behavior During Therapy: WFL for tasks assessed/performed Overall Cognitive Status: Within Functional Limits for tasks assessed  General Comments: Pt eager to get OOB and out of his room.      Exercises      General Comments        Pertinent Vitals/Pain Pain Assessment: Faces Faces Pain Scale: Hurts little more Pain Location: B LEs Pain Descriptors / Indicators: Guarding;Grimacing;Sore Pain Intervention(s): Monitored during session;Repositioned    Home Living                      Prior Function            PT Goals (current goals can now be found in the care plan section) Acute Rehab PT Goals Patient Stated Goal: To go to rehab and get stronger. Additional Goals Additional Goal #1:  Pt will perform WC mobility >125' with supervision to ensure safety with mobility. Progress towards PT goals: Progressing toward goals    Frequency    Min 5X/week      PT Plan Current plan remains appropriate    Co-evaluation              AM-PAC PT "6 Clicks" Mobility   Outcome Measure  Help needed turning from your back to your side while in a flat bed without using bedrails?: A Little Help needed moving from lying on your back to sitting on the side of a flat bed without using bedrails?: A Lot Help needed moving to and from a bed to a chair (including a wheelchair)?: A Lot Help needed standing up from a chair using your arms (e.g., wheelchair or bedside chair)?: Total Help needed to walk in hospital room?: Total Help needed climbing 3-5 steps with a railing? : Total 6 Click Score: 10    End of Session Equipment Utilized During Treatment: (maxi move sling/lift and bariatric WC.) Activity Tolerance: Patient tolerated treatment well Patient left: in chair;with call bell/phone within reach(sitting in [facility] Nurse Communication: Mobility status(informed nursing to use sling for back to bed.) PT Visit Diagnosis: Muscle weakness (generalized) (M62.81);History of falling (Z91.81)     Time: 7322-0254 PT Time Calculation (min) (ACUTE ONLY): 36 min  Charges:  $Therapeutic Activity: 8-22 mins $Wheel Chair Management: 8-22 mins                     Douglas [name] , PTA Acute Rehabilitation Services Pager [phone] Office [phone]     Douglas [name] Artis Delay 11/08/2019, 6:37 PM

## 2019-11-09 DIAGNOSIS — S76112A Strain of left quadriceps muscle, fascia and tendon, initial encounter: Secondary | ICD-10-CM | POA: Diagnosis not present

## 2019-11-09 DIAGNOSIS — S76122D Laceration of left quadriceps muscle, fascia and tendon, subsequent encounter: Secondary | ICD-10-CM | POA: Diagnosis not present

## 2019-11-09 DIAGNOSIS — D6869 Other thrombophilia: Secondary | ICD-10-CM | POA: Diagnosis not present

## 2019-11-09 DIAGNOSIS — I10 Essential (primary) hypertension: Secondary | ICD-10-CM | POA: Diagnosis not present

## 2019-11-09 DIAGNOSIS — M109 Gout, unspecified: Secondary | ICD-10-CM | POA: Diagnosis not present

## 2019-11-09 DIAGNOSIS — I5040 Unspecified combined systolic (congestive) and diastolic (congestive) heart failure: Secondary | ICD-10-CM | POA: Diagnosis not present

## 2019-11-09 DIAGNOSIS — E1151 Type 2 diabetes mellitus with diabetic peripheral angiopathy without gangrene: Secondary | ICD-10-CM | POA: Diagnosis not present

## 2019-11-09 DIAGNOSIS — Z7401 Bed confinement status: Secondary | ICD-10-CM | POA: Diagnosis not present

## 2019-11-09 DIAGNOSIS — R0902 Hypoxemia: Secondary | ICD-10-CM | POA: Diagnosis not present

## 2019-11-09 DIAGNOSIS — E1142 Type 2 diabetes mellitus with diabetic polyneuropathy: Secondary | ICD-10-CM | POA: Diagnosis not present

## 2019-11-09 DIAGNOSIS — M25561 Pain in right knee: Secondary | ICD-10-CM | POA: Diagnosis not present

## 2019-11-09 DIAGNOSIS — R112 Nausea with vomiting, unspecified: Secondary | ICD-10-CM | POA: Diagnosis not present

## 2019-11-09 DIAGNOSIS — M1000 Idiopathic gout, unspecified site: Secondary | ICD-10-CM | POA: Diagnosis not present

## 2019-11-09 DIAGNOSIS — S76102D Unspecified injury of left quadriceps muscle, fascia and tendon, subsequent encounter: Secondary | ICD-10-CM | POA: Diagnosis not present

## 2019-11-09 DIAGNOSIS — I5042 Chronic combined systolic (congestive) and diastolic (congestive) heart failure: Secondary | ICD-10-CM | POA: Diagnosis not present

## 2019-11-09 DIAGNOSIS — M25569 Pain in unspecified knee: Secondary | ICD-10-CM | POA: Diagnosis not present

## 2019-11-09 DIAGNOSIS — Z7901 Long term (current) use of anticoagulants: Secondary | ICD-10-CM | POA: Diagnosis not present

## 2019-11-09 DIAGNOSIS — M6281 Muscle weakness (generalized): Secondary | ICD-10-CM | POA: Diagnosis not present

## 2019-11-09 DIAGNOSIS — S86811A Strain of other muscle(s) and tendon(s) at lower leg level, right leg, initial encounter: Secondary | ICD-10-CM | POA: Diagnosis not present

## 2019-11-09 DIAGNOSIS — K219 Gastro-esophageal reflux disease without esophagitis: Secondary | ICD-10-CM | POA: Diagnosis not present

## 2019-11-09 DIAGNOSIS — R110 Nausea: Secondary | ICD-10-CM | POA: Diagnosis not present

## 2019-11-09 DIAGNOSIS — N170 Acute kidney failure with tubular necrosis: Secondary | ICD-10-CM | POA: Diagnosis not present

## 2019-11-09 DIAGNOSIS — I480 Paroxysmal atrial fibrillation: Secondary | ICD-10-CM | POA: Diagnosis not present

## 2019-11-09 DIAGNOSIS — M66871 Spontaneous rupture of other tendons, right ankle and foot: Secondary | ICD-10-CM | POA: Diagnosis not present

## 2019-11-09 DIAGNOSIS — E876 Hypokalemia: Secondary | ICD-10-CM | POA: Diagnosis not present

## 2019-11-09 DIAGNOSIS — E119 Type 2 diabetes mellitus without complications: Secondary | ICD-10-CM | POA: Diagnosis not present

## 2019-11-09 DIAGNOSIS — S86821D Laceration of other muscle(s) and tendon(s) at lower leg level, right leg, subsequent encounter: Secondary | ICD-10-CM | POA: Diagnosis not present

## 2019-11-09 DIAGNOSIS — R197 Diarrhea, unspecified: Secondary | ICD-10-CM | POA: Diagnosis not present

## 2019-11-09 DIAGNOSIS — Z9989 Dependence on other enabling machines and devices: Secondary | ICD-10-CM

## 2019-11-09 DIAGNOSIS — R262 Difficulty in walking, not elsewhere classified: Secondary | ICD-10-CM | POA: Diagnosis not present

## 2019-11-09 DIAGNOSIS — D649 Anemia, unspecified: Secondary | ICD-10-CM | POA: Diagnosis not present

## 2019-11-09 DIAGNOSIS — E1122 Type 2 diabetes mellitus with diabetic chronic kidney disease: Secondary | ICD-10-CM | POA: Diagnosis not present

## 2019-11-09 DIAGNOSIS — E1169 Type 2 diabetes mellitus with other specified complication: Secondary | ICD-10-CM | POA: Diagnosis not present

## 2019-11-09 DIAGNOSIS — G4730 Sleep apnea, unspecified: Secondary | ICD-10-CM | POA: Diagnosis not present

## 2019-11-09 DIAGNOSIS — M2550 Pain in unspecified joint: Secondary | ICD-10-CM | POA: Diagnosis not present

## 2019-11-09 DIAGNOSIS — I482 Chronic atrial fibrillation, unspecified: Secondary | ICD-10-CM | POA: Diagnosis not present

## 2019-11-09 DIAGNOSIS — R141 Gas pain: Secondary | ICD-10-CM | POA: Diagnosis not present

## 2019-11-09 DIAGNOSIS — I5032 Chronic diastolic (congestive) heart failure: Secondary | ICD-10-CM

## 2019-11-09 DIAGNOSIS — R5381 Other malaise: Secondary | ICD-10-CM | POA: Diagnosis not present

## 2019-11-09 DIAGNOSIS — R140 Abdominal distension (gaseous): Secondary | ICD-10-CM | POA: Diagnosis not present

## 2019-11-09 DIAGNOSIS — E261 Secondary hyperaldosteronism: Secondary | ICD-10-CM | POA: Diagnosis not present

## 2019-11-09 DIAGNOSIS — G4733 Obstructive sleep apnea (adult) (pediatric): Secondary | ICD-10-CM

## 2019-11-09 LAB — GLUCOSE, CAPILLARY
Glucose-Capillary: 100 mg/dL — ABNORMAL HIGH (ref 70–99)
Glucose-Capillary: 102 mg/dL — ABNORMAL HIGH (ref 70–99)
Glucose-Capillary: 85 mg/dL (ref 70–99)

## 2019-11-09 LAB — SARS CORONAVIRUS 2 (TAT 6-24 HRS): SARS Coronavirus 2: NEGATIVE

## 2019-11-09 NOTE — Care Management Important Message (Signed)
Important Message  Patient Details  Name: Douglas [name] MRN: [id number] Date of Birth: 1970-11-20   Medicare Important Message Given:  Yes     [name] Stefan Church 11/09/2019, 8:18 AM

## 2019-11-09 NOTE — TOC Progression Note (Signed)
Transition of Care [facility]) - Progression Note    Patient Details  Name: Douglas [name] MRN: [id number] Date of Birth: December 30, 1970  [facility] CM/SW Contact  Epifanio Lesches, RN Phone Number: [phone] 11/09/2019, 8:07 AM  Clinical Narrative:     NCM noted [facility] accepted in the hub for SNF placement . Pt made aware and accepted offer. [facility] admission made aware and extended bed offer. Insurance authorization pending, Updated COVID needed. Pt fully vaccinated. TOC team will continue to monitor ....  Expected Discharge Plan: Skilled Nursing Facility(CIR vs home with home health services) Barriers to Discharge: No Barriers Identified  Expected Discharge Plan and Services Expected Discharge Plan: Skilled Nursing Facility(CIR vs home with home health services)   Discharge Planning Services: CM Consult   Living arrangements for the past 2 months: Single Family Home Expected Discharge Date: 11/07/19                                     Social Determinants of Health (SDOH) Interventions    Readmission Risk Interventions No flowsheet data found.

## 2019-11-09 NOTE — Progress Notes (Signed)
SNF APPROVAL for [facility] REF. # [id number]. [facility] [id number]. X 3 DAYS START DATE 5/25, NEXT REVEIW DATE  5/27.CM ARIST DAGOUT. fAX # [phone]. Gae Gallop RN,BSN,CM

## 2019-11-09 NOTE — Progress Notes (Signed)
Patient places self on CPAP machine.  RT checked water chamber.  RT assistance not needed at this time.

## 2019-11-09 NOTE — Progress Notes (Addendum)
Occupational Therapy Treatment Patient Details Name: Douglas [name] MRN: [id number] DOB: 1971/01/24 Today's Date: 11/09/2019    History of present illness 49 yo male who has not walked in four weeks was admitted, noted his pain in knees from a recent fall that led to nonambulatory status.  Pt is now in acute renal failure, had low BP, shock,  and AKI with hypotension, acute renal failure.  Per MRI has L quad tear, medial retinacular tear, R patellar tendon rupture, lateral meniscal tear. s/p left Quadricep tendon repair, left knee and meniscus debridement, right knee patellar tendon repair. PMHx:  CHF, EF 50-55%, PAF, ileus, HTN   OT comments  Pt highly motivated for therapy session this date. He required totalA for LB dressing to don docks and adjust knee immobilizers. Educated pt on how to instruct other staff members to correctly position/reposition knee immobilizers. He required minA to helicopter self in bed in preparation for anterior posterior transfer. He required moderate three person assist to complete anterior posterior transfer to wheelchair. Pt will continue to benefit from skilled OT services to maximize safety and independence with ADL/IADL and functional mobility. Will continue to follow acutely and progress as tolerated.    Follow Up Recommendations  SNF    Equipment Recommendations  Wheelchair (measurements OT);Hospital bed;Other (comment)    Recommendations for Other Services      Precautions / Restrictions Precautions Precautions: Fall Required Braces or Orthoses: Knee Immobilizer - Right;Knee Immobilizer - Left Knee Immobilizer - Right: On at all times Knee Immobilizer - Left: On at all times Restrictions Weight Bearing Restrictions: Yes RLE Weight Bearing:Non Weight Bearing LLE Weight Bearing: Non Weight Bearing Other Position/Activity Restrictions: kept pt NWB through BLE        Mobility Bed Mobility Overal bed mobility: Needs Assistance Bed  Mobility: Rolling;Supine to Sit Rolling: Min assist   Supine to sit: Mod assist;+2 for physical assistance     General bed mobility comments: Rolling to R and L to place the maxislide.  Assistance to lift legs but does a good job managing his upper body. utilized gait belts on bed frame for pt to pull trunk to upright posture  Transfers Overall transfer level: Needs assistance Equipment used: (maxislide) Transfers: Licensed conveyancer transfers: Max assist;+2 physical assistance   General transfer comment: pt required minA to helicopter self in bed and modA+2 to position self in bed in preparation for anterior/posterior transfer;he required +3 for anterior posterior transfer with use of maxislide;pt required frequent rest breaks during transfer as he was unable to keep trunk upright for longer than 30seconds;required use of maximove to correctly position pt in w/c    Balance Overall balance assessment: Needs assistance;History of Falls Sitting-balance support: Bilateral upper extremity supported Sitting balance-Leahy Scale: Poor Sitting balance - Comments: heavy reliance on BUE support on bed rails to maintain sitting in long sitting in recliner                                   ADL either performed or assessed with clinical judgement   ADL Overall ADL's : Needs assistance/impaired                     Lower Body Dressing: Total assistance Lower Body Dressing Details (indicate cue type and reason): totalA to adjust BLE knee immobilizers;educated pt on how and when to communicate with staff to correctly  adjust KI Toilet Transfer: Minimal assistance;+2 for physical assistance;+2 for safety/equipment Toilet Transfer Details (indicate cue type and reason): rolling R<>L in bed Toileting- Clothing Manipulation and Hygiene: Total assistance Toileting - Clothing Manipulation Details (indicate cue type and reason): totalA for posterior  care     Functional mobility during ADLs: Maximal assistance;+2 for physical assistance;+2 for safety/equipment       Vision       Perception     Praxis      Cognition Arousal/Alertness: Awake/alert Behavior During Therapy: WFL for tasks assessed/performed Overall Cognitive Status: Within Functional Limits for tasks assessed                                 General Comments: Pt eager to get OOB and out of his room.        Exercises     Shoulder Instructions       General Comments      Pertinent Vitals/ Pain       Pain Assessment: No/denies pain Pain Intervention(s): Monitored during session  Home Living                                          Prior Functioning/Environment              Frequency  Min 2X/week        Progress Toward Goals  OT Goals(current goals can now be found in the care plan section)  Progress towards OT goals: Progressing toward goals  Acute Rehab OT Goals Patient Stated Goal: To go to rehab and get stronger. OT Goal Formulation: With patient Time For Goal Achievement: 11/14/19 Potential to Achieve Goals: Good ADL Goals Pt Will Perform Grooming: with modified independence;sitting Pt Will Perform Lower Body Dressing: with min assist;sitting/lateral leans;with adaptive equipment Pt Will Transfer to Toilet: with min assist;anterior/posterior transfer;with +2 assist  Plan Discharge plan needs to be updated(pt was not approved for CIR)    Co-evaluation    PT/OT/SLP Co-Evaluation/Treatment: Yes Reason for Co-Treatment: Complexity of the patient's impairments (multi-system involvement);For patient/therapist safety;To address functional/ADL transfers   OT goals addressed during session: ADL's and self-care      AM-PAC OT "6 Clicks" Daily Activity     Outcome Measure   Help from another person eating meals?: A Little Help from another person taking care of personal grooming?: A Little Help  from another person toileting, which includes using toliet, bedpan, or urinal?: A Lot Help from another person bathing (including washing, rinsing, drying)?: A Lot Help from another person to put on and taking off regular upper body clothing?: A Little Help from another person to put on and taking off regular lower body clothing?: A Lot 6 Click Score: 15    End of Session Equipment Utilized During Treatment: Right knee immobilizer;Left knee immobilizer(maximove)  OT Visit Diagnosis: Other abnormalities of gait and mobility (R26.89);History of falling (Z91.81);Muscle weakness (generalized) (M62.81)   Activity Tolerance Patient tolerated treatment well   Patient Left in bed;with call bell/phone within reach;with nursing/sitter in room   Nurse Communication Mobility status        Time: 1340-1425 OT Time Calculation (min): 45 min  Charges: OT General Charges $OT Visit: 1 Visit OT Treatments $Self Care/Home Management : 23-37 mins  Helene Kelp OTR/L [place] Office: [place]  Markice Torbert 11/09/2019, 2:50 PM

## 2019-11-09 NOTE — Progress Notes (Signed)
[facility]AR arrived. Pt stable. Offered pain medication for travel and pt declined. Belongings sent home with wife (2 bag and 1 plants). 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

## 2019-11-09 NOTE — Discharge Summary (Signed)
Discharge Summary  Douglas [name] ZOX:[id number] DOB: June 15, 1971  PCP: Toma Deiters, MD  Admit date: 10/20/2019 Discharge date: 11/09/2019  Time spent: , more than 50% time spent on coordination of care.   Recommendations for Outpatient Follow-up:  1. F/u with SNF MD  for hospital discharge follow up, repeat cbc/bmp at follow up. SNF MD to monitor blood pressure and adjust bp meds if needed 2. Follow-up with orthopedic Dr. August Saucer in 2 weeks, patient will keep knee immobolizers and remain nonweightbearing BLE until further instruction by Dr August Saucer in two weeks   Discharge Diagnoses:  Active Hospital Problems   Diagnosis Date Noted  . Atrial fibrillation (HCC) 10/20/2019  . Hypertension 10/20/2019  . Sleep apnea 10/20/2019  . Knee pain 10/20/2019  . Diabetes mellitus without complication (HCC)   . CHF (congestive heart failure) (HCC)   . Gout   . Rupture of left quadriceps tendon   . Rupture of patellar tendon, right, initial encounter   . ATN (acute tubular necrosis) [facility])     Resolved Hospital Problems  No resolved problems to display.    Discharge Condition: stable  Diet recommendation: heart healthy/carb modified  Filed Weights   10/27/19 0330 10/31/19 0937 11/01/19 1520  Weight: (!) 181.4 kg (!) 182.1 kg (!) 182.1 kg    History of present illness: (Per admitting MD Dr. Earlene Plater) Chief Complaint:  Bilateral Knee Pain   HPI: Douglas [name] is 49 y.o. male with PMH of CHF, DM, HTN, gout who presents to ER with bilateral knee pain. Patient has been admitted to [facility] for 4 weeks for knee pain after a fall. He has been unable to bear weight since this injury. Hospital was unable to obtain MRI during this time period. Patient reports x-rays were normal.    ED work-up/course:  Overly obese patient with a past medical history of diabetes, hypertension presents to the ED after a 4-week admission at [facility], reports he was  there after fall, has been nonambulatory for the past 4 weeks. Decided to arrive at [facility] in order to obtain MRI of bilateral knees as x-rays were within normal limits at prior hospital admission.  Reports he has not been ambulatory in 4 weeks, will place consult for orthopedist for further recommendations.  8:00 PMSpoke to Dr. Roda Shutters who recommended BL knee MRI, admitted to hospitalist service.Also recommended bilateral DVT studies due to patient being immobile for the past 4 weeks.  Position of his labs remarkable with a CBC without any leukocytosis, hemoglobin is within normal limits. CMP without any electro abnormality, BUN is slightly elevated, creatinine level is 2.5,prior history of "leaky kidney",no prior levels for comparison.  MRI left knee showed full thickness tear of the medial fibers of the quadriceps tendon. MRI right knee with full thickness tear of the patellar tendon.   Hospital Course:  Active Problems:   Diabetes mellitus without complication (HCC)   CHF (congestive heart failure) (HCC)   Gout   Hypertension   Sleep apnea   Atrial fibrillation (HCC)   Rupture of left quadriceps tendon   Rupture of patellar tendon, right, initial encounter   ATN (acute tubular necrosis) (HCC)   Knee pain  ATN due to hypotension present on admission: NSAIDs were discontinued.  On CRRT on 10/22/2019 through 10/26/2019,  his creatinine has improved drastically as well as his urine output, and his creatinine has improved to baseline -lasix initially on hold, resumed on May 21 -Losartan held in the hospital, resumed at  discharge, snf MD to please follow up and further adjust bp meds if needed.   Shock multifactorial in the setting of hypotension/questionable UTI: He completed 7-day course of IV antibiotics last dose on 10/29/2019, shock criteria resolved with supportive care and IV fluids.  Ileus: Resolved in the setting of renal failure tolerating his  diet. Resolved.  Hypokalemia/hypomagnesemia Replaced and normalized, repeat labs at hospital discharge follow-up  Paroxysmal Afib  -In sinus rhythm -continue Diltiazem and metoprolol -continue Eliquis   Chronic diastolic CHF -Lasix held on admission, resumed on May 21  HTN  - Metoprolol , cardizem, Hydralazine , losartan, lasix -please close monitor blood pressure at snf, further bp meds adjustment per snf MD   T2DM, non-insulin-dependent, fairly controlled with A1c 6.8 -hold Metformin held in the hospital  due to AKI  -resume at discharge  Morbid obesity: Body mass index is 54.44 kg/m.  Lifestyle modification   OSA -CPAP QHS   Bilateral knee injuries due to fall, present on admission: Orthopedic Dr. Charlton Haws input appreciated Left : Chronic QUADRICEP TENDON REPAIR and ARTHROSCOPY KNEE AND MENISCUS DEBRIDEMENT left knee right: Chronic PATELLAR TENDON REPAIR right knee Currently nonweightbearing status in both legs, keep full extension of the knees, follow-up with Dr. August Saucer in 2 weeks SNF placement  Procedures: Chronic QUADRICEP TENDON REPAIR, left ARTHROSCOPY KNEE AND MENISCUS DEBRIDEMENT left knee Chronic PATELLAR TENDON REPAIR right knee By Dr August Saucer on 5/18  Consultations:  Orthopedic Dr. August Saucer  General surgery Dr. Dwain Sarna  Nephrology  Critical care  Discharge Exam: BP 123/62 (BP Location: Left Wrist)   Pulse 82   Temp 98.2 F (36.8 C) (Oral)   Resp 16   Ht 6' 0.01" (1.829 m)   Wt (!) 182.1 kg   SpO2 94%   BMI 54.44 kg/m   General: NAD, pleasant  Cardiovascular: RRR Respiratory: normal respiratory effort +Bilateral knee immobolizer  Discharge Instructions You were cared for by a hospitalist during your hospital stay. If you have any questions about your discharge medications or the care you received while you were in the hospital after you are discharged, you can call the unit and asked to speak with the hospitalist on call if the  hospitalist that took care of you is not available. Once you are discharged, your primary care physician will handle any further medical issues. Please note that NO REFILLS for any discharge medications will be authorized once you are discharged, as it is imperative that you return to your primary care physician (or establish a relationship with a primary care physician if you do not have one) for your aftercare needs so that they can reassess your need for medications and monitor your lab values.  Discharge Instructions    Diet - low sodium heart healthy   Complete by: As directed    Carb modified diet   Increase activity slowly   Complete by: As directed    Currently NWB status bilateral knee, further activity directions per ortho Dr August Saucer     Allergies as of 11/09/2019      Reactions   Benazepril Anaphylaxis   Betadine [povidone Iodine]    rash   Prednisone Other (See Comments)   Headache.      Medication List    STOP taking these medications   POTASSIUM PO     TAKE these medications   allopurinol 300 MG tablet Commonly known as: ZYLOPRIM Take 300 mg by mouth daily.   atorvastatin 20 MG tablet Commonly known as: LIPITOR Take 1 tablet  by mouth daily.   diltiazem 360 MG 24 hr capsule Commonly known as: CARDIZEM CD Take 360 mg by mouth daily.   Eliquis 5 MG Tabs tablet Generic drug: apixaban Take 5 mg by mouth 2 (two) times daily.   fluticasone 50 MCG/ACT nasal spray Commonly known as: FLONASE Place 1 spray into both nostrils daily as needed.   Fluticasone-Salmeterol 250-50 MCG/DOSE Aepb Commonly known as: ADVAIR Inhale 1 puff into the lungs 2 (two) times daily as needed for shortness of breath or wheezing.   furosemide 40 MG tablet Commonly known as: LASIX Take 40 mg by mouth daily.   HM Fexofenadine HCl 180 MG tablet Generic drug: fexofenadine Take 180 mg by mouth daily as needed.   hydrALAZINE 50 MG tablet Commonly known as: APRESOLINE Take 1 tablet (50  mg total) by mouth 2 (two) times daily. Start taking on: November 23, 2019 What changed: These instructions start on November 23, 2019. If you are unsure what to do until then, ask your doctor or other care provider.   losartan 100 MG tablet Commonly known as: COZAAR Take 1 tablet (100 mg total) by mouth daily. Start taking on: December 01, 2019 What changed: These instructions start on December 01, 2019. If you are unsure what to do until then, ask your doctor or other care provider.   metFORMIN 1000 MG tablet Commonly known as: GLUCOPHAGE Take 1,000 mg by mouth daily.   metoprolol succinate 50 MG 24 hr tablet Commonly known as: TOPROL-XL Take 100 mg by mouth daily.   montelukast 10 MG tablet Commonly known as: SINGULAIR Take 10 mg by mouth daily as needed.   Omega-3 1000 MG Caps Take 1 capsule by mouth daily.   omeprazole 20 MG capsule Commonly known as: PRILOSEC Take 20 mg by mouth daily.   sertraline 50 MG tablet Commonly known as: ZOLOFT Take 50 mg by mouth daily.      Allergies  Allergen Reactions  . Benazepril Anaphylaxis  . Betadine [Povidone Iodine]     rash  . Prednisone Other (See Comments)    Headache.   Follow-up Information    August Saucer Corrie Mckusick, MD Follow up in 2 week(s).   Specialty: Orthopedic Surgery Contact information: [address] [place] [address] [phone]            The results of significant diagnostics from this hospitalization (including imaging, microbiology, ancillary and laboratory) are listed below for reference.    Significant Diagnostic Studies: CT ABDOMEN PELVIS WO CONTRAST  Result Date: 10/21/2019 CLINICAL DATA:  Abdominal bloating and distension. Inpatient. EXAM: CT ABDOMEN AND PELVIS WITHOUT CONTRAST TECHNIQUE: Multidetector CT imaging of the abdomen and pelvis was performed following the standard protocol without IV contrast. COMPARISON:  Abdominal radiograph from earlier today. 06/27/2013 CT abdomen/pelvis. FINDINGS: Lower  chest: Solid 1.3 cm right lower lobe pulmonary nodule (series 5/image 5), stable since 03/29/2019 chest CT. Circumferential mild wall thickening in the lower thoracic esophagus. Hepatobiliary: Normal liver size. No liver mass. Normal gallbladder with no radiopaque cholelithiasis. No biliary ductal dilatation. Pancreas: Normal, with no mass or duct dilation. Spleen: Normal size. No mass. Adrenals/Urinary Tract: Normal adrenals. No renal stones. No hydronephrosis. No contour deforming renal masses. Bladder collapsed by indwelling Foley catheter. Stomach/Bowel: Stomach moderately distended with large air-fluid level. No gastric wall thickening or pneumatosis. Enteric tube terminates in the body of the stomach. There is moderate diffuse dilatation of the proximal to mid small bowel up to 5.5 cm diameter. Air-fluid levels are scattered throughout  the dilated small bowel loops. Distal and terminal ileum are collapsed. No discrete small bowel caliber transition. No small bowel wall thickening or pneumatosis. Normal appendix. Moderate gaseous distention of the large bowel, most prominent in the right and transverse colon. Scattered small air-fluid levels in the large bowel. No large bowel wall thickening, diverticulosis or significant pericolonic fat stranding. Vascular/Lymphatic: Normal caliber abdominal aorta. No pathologically enlarged lymph nodes in the abdomen or pelvis. Reproductive: Normal size prostate with nonspecific internal prostatic calcification. Other: No pneumoperitoneum, ascites or focal fluid collection. Musculoskeletal: No aggressive appearing focal osseous lesions. Moderate thoracolumbar spondylosis. IMPRESSION: 1. CT findings are most suggestive of a severe diffuse adynamic ileus given widespread small and large bowel dilatation with air-fluid levels. The possibility of a partial mechanical mid to distal small bowel obstruction is not excluded given the relatively collapsed distal small bowel. No free  air. No bowel wall thickening or pneumatosis. 2. Enteric tube terminates in the body of the stomach. 3. Circumferential mild wall thickening in the lower thoracic esophagus, nonspecific, potentially due to reflux esophagitis, with Barrett's esophagus or neoplasm not excluded. Upper endoscopic correlation may be considered as clinically warranted. 4. Solid 1.3 cm right lower lobe pulmonary nodule, stable since 03/29/2019 chest CT. Please see the 03/29/2019 chest CT report for commentary and follow-up recommendations. 5. Aortic Atherosclerosis (ICD10-I70.0). Electronically Signed   By: Delbert Phenix M.D.   On: 10/21/2019 18:41   DG Chest 2 View  Result Date: 10/21/2019 CLINICAL DATA:  49 year old male with renal failure. EXAM: CHEST - 2 VIEW COMPARISON:  Chest radiograph dated 01/26/2019. FINDINGS: Shallow inspiration. No focal consolidation, pleural effusion, pneumothorax. Mild cardiomegaly. No acute osseous pathology. IMPRESSION: 1. No active cardiopulmonary disease. 2. Mild cardiomegaly. Electronically Signed   By: Elgie Collard M.D.   On: 10/21/2019 00:45   DG Abd 1 View  Result Date: 10/21/2019 CLINICAL DATA:  NG tube placement EXAM: ABDOMEN - 1 VIEW COMPARISON:  Oct 21, 2019 FINDINGS: The NG tube appears to terminate over the stomach. The side hole may be at the level of the GE junction. Distended loops of small bowel are again noted. The stomach is distended. IMPRESSION: 1. Enteric tube tip projects over the gastric body. Consider advancing the tube further into the stomach as the side hole is likely at the level of the GE junction. 2. Distended stomach and loops of small bowel are noted in the upper abdomen. Electronically Signed   By: Katherine Mantle M.D.   On: 10/21/2019 15:53   DG Abd 1 View  Result Date: 10/21/2019 CLINICAL DATA:  NG tube placement EXAM: ABDOMEN - 1 VIEW COMPARISON:  None. FINDINGS: There is no nasogastric tube identified. There is gaseous distension of the small bowel and  colon. There is no evidence of pneumoperitoneum, portal venous gas or pneumatosis. There are no pathologic calcifications along the expected course of the ureters. The osseous structures are unremarkable. IMPRESSION: No nasogastric tube identified. Electronically Signed   By: Elige Ko   On: 10/21/2019 12:53   US RENAL  Result Date: 10/22/2019 CLINICAL DATA:  Acute kidney injury EXAM: RENAL / URINARY TRACT ULTRASOUND COMPLETE COMPARISON:  Oct 21, 2019 FINDINGS: Right Kidney: Renal measurements: 9.2 x 3.9 x 5.1 cm = volume: 95 mL . Echogenicity within normal limits. No mass or hydronephrosis visualized. Left Kidney: Renal measurements: 8.1 x 5.5 x 5 = volume: 116 mL. Echogenicity within normal limits. No mass or hydronephrosis visualized. Bladder: The bladder is decompressed with a Foley catheter which  limits evaluation. Other: Overall evaluation of the kidneys was limited by patient body habitus. IMPRESSION: 1. Habitus limited study. 2. No definite acute abnormality given the above limitation. No evidence for hydronephrosis. 3. Bladder decompressed with a Foley catheter which limits evaluation. Electronically Signed   By: Constance Holster M.D.   On: 10/22/2019 16:48   MR KNEE RIGHT WO CONTRAST  Result Date: 10/20/2019 CLINICAL DATA:  Recent right knee trauma, inability to stand EXAM: MRI OF THE RIGHT KNEE WITHOUT CONTRAST TECHNIQUE: Multiplanar, multisequence MR imaging of the knee was performed. No intravenous contrast was administered. COMPARISON:  10/01/2019 FINDINGS: MENISCI Medial meniscus: Diffuse degenerative changes are noted, with maceration of the posterior horn and body of the medial meniscus. There is a focal tear of the undersurface of the medial meniscus at the junction of the body and posterior horn. 1 cm parameniscal cyst along the posterior horn of the medial meniscus. Lateral meniscus:  No evidence of focal meniscal tear. LIGAMENTS Cruciates: Thickening of the ACL at the insertion on the  tibia with intrasubstance increased T2 signal consistent with partial tear or chronic degenerative change. PCL is intact with normal signal. Collaterals: Medial and lateral collateral ligament complexes appear intact. CARTILAGE Patellofemoral: Irregularity and thinning of the patellar cartilage at its apex and along the lateral patellar facet. Medial: Marked thinning of the articular cartilage in the medial compartment, more pronounced along the tibial aspect. Lateral: Mild articular cartilage thinning within the lateral compartment. Joint: There is a moderate suprapatellar joint effusion. Thickening of the plica is noted. There is abnormal edema within the Hoffa fat pad, with evidence of complete tear of the patellar tendon. Popliteal Fossa:  No Baker cyst. Intact popliteus tendon. Extensor Mechanism: There is a complete full-thickness tear of the patellar tendon with retraction. No evidence of avulsion fracture. The quadriceps tendon appears intact. Bones: No focal marrow signal abnormality. No fracture or dislocation. Other: There is diffuse subcutaneous edema most pronounced within the prepatellar and infrapatellar regions. IMPRESSION: 1. Full thickness full width tear of the patellar tendon with retraction. No evidence of patellar avulsion fracture. 2. Degenerative changes within the medial compartment of the knee, with associated meniscal tear. 3. Moderate joint effusion, with diffuse subcutaneous edema as above. 4. Thickening and intrasubstance edema within the ACL, consistent with partial tear or mucoid degeneration. Electronically Signed   By: Randa Ngo M.D.   On: 10/20/2019 21:53   MR KNEE LEFT WO CONTRAST  Result Date: 10/20/2019 CLINICAL DATA:  [place], knee injury, abnormal quadriceps tendon on previous CT EXAM: MRI OF THE LEFT KNEE WITHOUT CONTRAST TECHNIQUE: Multiplanar, multisequence MR imaging of the knee was performed. No intravenous contrast was administered. COMPARISON:  10/01/2019 FINDINGS:  MENISCI Medial meniscus:  Degenerative change without focal meniscal tear. Lateral meniscus: There may be a bucket-handle type tear of the lateral meniscus, with flipped fragment in the posteromedial aspect of the lateral compartment of the left knee. LIGAMENTS Cruciates: Thickening of the ACL without evidence of tear, likely related to chronic degenerative change. PCL is intact with normal signal characteristics. Collaterals: Fibers are indistinct at the femoral attachment of the medial collateral ligament, consistent with partial tear or strain. LCL complex is intact. CARTILAGE Patellofemoral:  Mild chondromalacia at the patellar apex. Medial: Mild thinning of the articular cartilage in the medial compartment without focal defect. Lateral:  No chondral defect. Joint: There is a moderate suprapatellar joint effusion with plical thickening. Lateral subluxation of the patella within the trochlear groove. Likely disruption of the fibers  of the medial patellar retinaculum at their femoral insertion. Popliteal Fossa:  No Baker cyst. Intact popliteus tendon. Extensor Mechanism: There is a full-thickness tear through the medial fibers of the quadriceps tendon mechanism. The lateral fibers appear intact. Greater than 50% of the with of the quadriceps tendon is disrupted. The patellar tendon is redundant and retracted but intact. Bones: No focal marrow signal abnormality. No fracture or dislocation. Other: None. IMPRESSION: 1. Full-thickness tear through the medial fibers of the quadriceps tendon mechanism. Greater than 50% of the with of the quadriceps tendon disrupted. 2. Lateral subluxation of the patella within the trochlear groove, with disruption of fibers at the femoral insertion of the medial patellar retinaculum. 3. Possible bucket-handle type tear of the lateral meniscus, with flipped fragment in the posteromedial aspect of the lateral compartment of the left knee. 4. Partial tear or strain of the femoral  attachment of the medial collateral ligament. 5. Moderate suprapatellar joint effusion with plical thickening. 6. Thickening of the ACL without evidence of tear, likely related to chronic degenerative change. Electronically Signed   By: Sharlet SalinaMichael  Brown M.D.   On: 10/20/2019 22:31   DG Chest Port 1 View  Result Date: 10/22/2019 CLINICAL DATA:  Encounter for central line placement. EXAM: PORTABLE CHEST 1 VIEW COMPARISON:  Oct 21, 2019 FINDINGS: There is a newly placed non tunneled dialysis catheter on the right. The tip terminates over the cavoatrial junction. The enteric tube tip is not visualized on this study. The lung volumes are low. The heart size is enlarged. There is vascular congestion with pulmonary edema. There is no pneumothorax. IMPRESSION: 1. Well-positioned right-sided non tunneled dialysis catheter. No pneumothorax. 2. Cardiomegaly with persistent pulmonary edema. 3. Low lung volumes. Electronically Signed   By: Katherine Mantlehristopher  Green M.D.   On: 10/22/2019 18:04   DG Abd 2 Views  Result Date: 10/21/2019 CLINICAL DATA:  Abdominal distention. EXAM: ABDOMEN - 2 VIEW COMPARISON:  10/16/2019. FINDINGS: Abdomen is incompletely imaged. Multiple distended loops of small and large bowel are noted. Bowel obstruction or adynamic ileus could present in this fashion. Follow-up exam suggested to demonstrate resolution. No free air identified. IMPRESSION: Multiple distended loops of small large bowel noted. Bowel obstruction or adynamic ileus could present this fashion. Follow-up exam suggested to demonstrate resolution. Electronically Signed   By: Maisie Fushomas  Register   On: 10/21/2019 10:39   DG Abd Portable 1V  Result Date: 10/24/2019 CLINICAL DATA:  Nasogastric tube present. EXAM: PORTABLE ABDOMEN - 1 VIEW COMPARISON:  Oct 21, 2019. FINDINGS: Continued small bowel dilatation is noted. Distal tip of nasogastric tube is seen in proximal stomach. No radio-opaque calculi or other significant radiographic abnormality  are seen. IMPRESSION: Distal tip of nasogastric tube seen in proximal stomach. Continued small bowel dilatation is noted concerning for distal small bowel obstruction or ileus. Electronically Signed   By: Lupita RaiderJames  Green Jr M.D.   On: 10/24/2019 08:09   DG Abd Portable 1V  Result Date: 10/21/2019 CLINICAL DATA:  Abdominal pain. Non bilious vomiting. EXAM: PORTABLE ABDOMEN - 1 VIEW COMPARISON:  None. FINDINGS: Mild gaseous distention of large and small bowel is present. There is some gaseous distention the stomach. Definite free air is present. No obstruction is evident. Gas is seen into the distal colon. Changes are present spine. IMPRESSION: Mild gaseous distention of large and small bowel without obstruction. This may represent ileus. Electronically Signed   By: Marin Robertshristopher  Mattern M.D.   On: 10/21/2019 04:17   ECHOCARDIOGRAM COMPLETE  Result Date: 10/22/2019  ECHOCARDIOGRAM REPORT   Patient Name:   PACO CISLO [facility] Date of Exam: 10/22/2019 Medical Rec #:  [id number]              Height:       72.0 in Accession #:    [id number]             Weight:       410.0 lb Date of Birth:  1970/10/20              BSA:          2.891 m Patient Age:    48 years               BP:           87/54 mmHg Patient Gender: M                      HR:           114 bpm. Exam Location:  Inpatient Procedure: 2D Echo, Cardiac Doppler and Color Doppler Indications:    Dyspnea  History:        Patient has no prior history of Echocardiogram examinations.                 Signs/Symptoms:Dyspnea; Risk Factors:Diabetes, Hypertension and                 Morbid obesity.  Sonographer:    Lavenia Atlas Referring Phys: Herminio Heads  Sonographer Comments: No apical window, no subcostal window and patient is morbidly obese. Image acquisition challenging due to patient body habitus. IMPRESSIONS  1. Technically difficult echo with poor image quality.  2. Left ventricular ejection fraction, by estimation, is 50 to 55%. The left ventricle  has low normal function. The left ventricle has no regional wall motion abnormalities. There is moderate left ventricular hypertrophy. Left ventricular diastolic function could not be evaluated.  3. Right ventricular systolic function was not well visualized. The right ventricular size is not well visualized.  4. The mitral valve is grossly normal. No evidence of mitral valve regurgitation. No evidence of mitral stenosis.  5. The aortic valve is grossly normal. Aortic valve regurgitation is not visualized. No aortic stenosis is present. FINDINGS  Left Ventricle: Left ventricular ejection fraction, by estimation, is 50 to 55%. The left ventricle has low normal function. The left ventricle has no regional wall motion abnormalities. The left ventricular internal cavity size was normal in size. There is moderate left ventricular hypertrophy. Left ventricular diastolic function could not be evaluated. Right Ventricle: The right ventricular size is not well visualized. Right vetricular wall thickness was not assessed. Right ventricular systolic function was not well visualized. Left Atrium: Left atrial size was normal in size. Right Atrium: Right atrial size was not well visualized. Pericardium: There is no evidence of pericardial effusion. Mitral Valve: The mitral valve is grossly normal. No evidence of mitral valve regurgitation. No evidence of mitral valve stenosis. Tricuspid Valve: The tricuspid valve is not well visualized. Tricuspid valve regurgitation is not demonstrated. Aortic Valve: The aortic valve is grossly normal. Aortic valve regurgitation is not visualized. No aortic stenosis is present. Pulmonic Valve: The pulmonic valve was normal in structure. Pulmonic valve regurgitation is not visualized. Aorta: The aortic root and ascending aorta are structurally normal, with no evidence of dilitation. IAS/Shunts: The interatrial septum was not well visualized. Additional Comments: Technically difficult echo with  poor image quality.  LEFT VENTRICLE PLAX 2D LVIDd:  4.95 cm LVIDs:         3.76 cm LV PW:         1.48 cm LV IVS:        1.30 cm LVOT diam:     2.60 cm LVOT Area:     5.31 cm  LEFT ATRIUM         Index LA diam:    4.00 cm 1.38 cm/m   AORTA Ao Root diam: 2.70 cm  SHUNTS Systemic Diam: 2.60 cm Kristeen Miss MD Electronically signed by Kristeen Miss MD Signature Date/Time: 10/22/2019/3:28:20 PM    Final    VAS [place] LOWER EXTREMITY VENOUS (DVT)  Result Date: 10/21/2019  Lower Venous DVTStudy Indications: Edema.  Risk Factors: None identified. Limitations: Body habitus and poor ultrasound/tissue interface. Comparison Study: No prior studies. Performing Technologist: Chanda Busing RVT  Examination Guidelines: A complete evaluation includes B-mode imaging, spectral Doppler, color Doppler, and power Doppler as needed of all accessible portions of each vessel. Bilateral testing is considered an integral part of a complete examination. Limited examinations for reoccurring indications may be performed as noted. The reflux portion of the exam is performed with the patient in reverse Trendelenburg.  +---------+---------------+---------+-----------+----------+--------------+ RIGHT    CompressibilityPhasicitySpontaneityPropertiesThrombus Aging +---------+---------------+---------+-----------+----------+--------------+ CFV      Full           Yes      Yes                                 +---------+---------------+---------+-----------+----------+--------------+ SFJ      Full                                                        +---------+---------------+---------+-----------+----------+--------------+ FV Prox  Full                                                        +---------+---------------+---------+-----------+----------+--------------+ FV Mid   Full                                                        +---------+---------------+---------+-----------+----------+--------------+  FV DistalFull                                                        +---------+---------------+---------+-----------+----------+--------------+ PFV                                                   Not visualized +---------+---------------+---------+-----------+----------+--------------+ POP      Full           Yes      Yes                                 +---------+---------------+---------+-----------+----------+--------------+  PTV      Full                                                        +---------+---------------+---------+-----------+----------+--------------+ PERO     Full                                                        +---------+---------------+---------+-----------+----------+--------------+   +---------+---------------+---------+-----------+----------+--------------+ LEFT     CompressibilityPhasicitySpontaneityPropertiesThrombus Aging +---------+---------------+---------+-----------+----------+--------------+ CFV      Full           Yes      Yes                                 +---------+---------------+---------+-----------+----------+--------------+ SFJ      Full                                                        +---------+---------------+---------+-----------+----------+--------------+ FV Prox  Full                                                        +---------+---------------+---------+-----------+----------+--------------+ FV Mid   Full                                                        +---------+---------------+---------+-----------+----------+--------------+ FV DistalFull                                                        +---------+---------------+---------+-----------+----------+--------------+ PFV                                                   Not visualized +---------+---------------+---------+-----------+----------+--------------+ POP      Full           Yes      Yes                                  +---------+---------------+---------+-----------+----------+--------------+ PTV      Full                                                        +---------+---------------+---------+-----------+----------+--------------+  PERO     Full                                                        +---------+---------------+---------+-----------+----------+--------------+     Summary: RIGHT: - There is no evidence of deep vein thrombosis in the lower extremity. However, portions of this examination were limited- see technologist comments above.  - No cystic structure found in the popliteal fossa.  LEFT: - There is no evidence of deep vein thrombosis in the lower extremity. However, portions of this examination were limited- see technologist comments above.  - No cystic structure found in the popliteal fossa.  *See table(s) above for measurements and observations. Electronically signed by Waverly Ferrari MD on 10/21/2019 at 7:24:15 PM.    Final     Microbiology: No results found for this or any previous visit (from the past 240 hour(s)).   Labs: Basic Metabolic Panel: Recent Labs  Lab 11/03/19 0207 11/04/19 0623 11/05/19 0405 11/06/19 0255  NA 142 140  --  137  K 4.1 4.1  --  4.2  CL 110 107  --  103  CO2 23 24  --  26  GLUCOSE 106* 96  --  98  BUN 10 8  --  13  CREATININE 0.84 0.73  --  0.83  CALCIUM 8.7* 8.8*  --  8.9  MG 1.7 1.6* 1.5* 1.7  PHOS 3.6  --   --   --    Liver Function Tests: Recent Labs  Lab 11/03/19 0207  ALBUMIN 2.4*   No results for input(s): LIPASE, AMYLASE in the last 168 hours. No results for input(s): AMMONIA in the last 168 hours. CBC: Recent Labs  Lab 11/05/19 0405  WBC 9.0  HGB 9.2*  HCT 30.4*  MCV 92.1  PLT 402*   Cardiac Enzymes: No results for input(s): CKTOTAL, CKMB, CKMBINDEX, TROPONINI in the last 168 hours. BNP: BNP (last 3 results) Recent Labs    10/21/19 0050  BNP 49.3    ProBNP (last 3  results) No results for input(s): PROBNP in the last 8760 hours.  CBG: Recent Labs  Lab 11/08/19 1157 11/08/19 1752 11/08/19 2359 11/09/19 0557 11/09/19 1152  GLUCAP 104* 104* 102* 100* 85       Signed:  Albertine Grates MD, PhD, FACP  [facility] 11/09/2019, 2:10 PM

## 2019-11-09 NOTE — Progress Notes (Signed)
Physical Therapy Treatment Patient Details Name: Douglas [name] MRN: [id number] DOB: 1970-11-12 Today's Date: 11/09/2019    History of Present Illness 49 yo male who has not walked in four weeks was admitted, noted his pain in knees from a recent fall that led to nonambulatory status.  Pt is now in acute renal failure, had low BP, shock,  and AKI with hypotension, acute renal failure.  Per MRI has L quad tear, medial retinacular tear, R patellar tendon rupture, lateral meniscal tear. s/p left Quadricep tendon repair, left knee and meniscus debridement, right knee patellar tendon repair. PMHx:  CHF, EF 50-55%, PAF, ileus, HTN    PT Comments    Pt supine in bed on arrival this session.  Pt very motivated to participate with PT utilized maxislide to scoot in bed and into a WC.  He was very fatigued after AP transfer and required assistance to propel WC.  Pt required assistance for repositioning after session.  Continue to recommend SNF placement to maximize functional gains to return home at  [facility] level.    Of note readjusted braces this session for improved fit.     Follow Up Recommendations  SNF     Equipment Recommendations  (defer to post acute)    Recommendations for Other Services Rehab consult     Precautions / Restrictions Precautions Precautions: Fall Required Braces or Orthoses: Knee Immobilizer - Right;Knee Immobilizer - Left Knee Immobilizer - Right: On at all times Knee Immobilizer - Left: On at all times Restrictions Weight Bearing Restrictions: Yes RLE Weight Bearing: Non weight bearing LLE Weight Bearing: Non weight bearing Other Position/Activity Restrictions: kept pt NWB through BLE     Mobility  Bed Mobility Overal bed mobility: Needs Assistance Bed Mobility: Rolling;Supine to Sit Rolling: Min assist   Supine to sit: Mod assist;+2 for physical assistance     General bed mobility comments: Rolling to R and L to place the maxislide.  Assistance to  lift legs but does a good job managing his upper body. utilized gait belts on bed frame for pt to pull trunk to upright posture  Transfers Overall transfer level: Needs assistance Equipment used: (maxislide) Transfers: Licensed conveyancer transfers: Max assist;+2 physical assistance   General transfer comment: pt required minA to helicopter self in bed and modA+2 to position self in bed in preparation for anterior/posterior transfer;he required +3 for anterior posterior transfer with use of maxislide;pt required frequent rest breaks during transfer as he was unable to keep trunk upright for longer than 30seconds;required use of maximove to correctly position pt in w/c  Ambulation/Gait                 Psychologist, counselling mobility: Yes Wheelchair propulsion: Both upper extremities Distance: 15 ft, 20 ft 20 ft.  Pt too fatigued to perform increased trials. Wheelchair Assistance Details (indicate cue type and reason): Pt required total assistance for back and turning in tight spaces.  Min assistance for forward propulsion and moderate assistance for turning R and L.  Modified Rankin (Stroke Patients Only)       Balance Overall balance assessment: Needs assistance;History of Falls Sitting-balance support: Bilateral upper extremity supported Sitting balance-Leahy Scale: Poor Sitting balance - Comments: heavy reliance on BUE support on bed rails to maintain sitting in long sitting in recliner  Cognition Arousal/Alertness: Awake/alert Behavior During Therapy: WFL for tasks assessed/performed Overall Cognitive Status: Within Functional Limits for tasks assessed                                 General Comments: Pt eager to get OOB and out of his room.      Exercises      General Comments        Pertinent Vitals/Pain  Pain Assessment: Faces Faces Pain Scale: Hurts little more Pain Location: B LEs Pain Descriptors / Indicators: Guarding;Grimacing;Sore Pain Intervention(s): Repositioned    Home Living                      Prior Function            PT Goals (current goals can now be found in the care plan section) Acute Rehab PT Goals Patient Stated Goal: To go to rehab and get stronger. PT Goal Formulation: With patient Potential to Achieve Goals: Fair Additional Goals Additional Goal #1: Pt will perform WC mobility >125' with supervision to ensure safety with mobility. Progress towards PT goals: Progressing toward goals    Frequency    Min 5X/week      PT Plan Current plan remains appropriate    Co-evaluation PT/OT/SLP Co-Evaluation/Treatment: Yes Reason for Co-Treatment: Complexity of the patient's impairments (multi-system involvement) PT goals addressed during session: Mobility/safety with mobility OT goals addressed during session: ADL's and self-care      AM-PAC PT "6 Clicks" Mobility   Outcome Measure  Help needed turning from your back to your side while in a flat bed without using bedrails?: A Little Help needed moving from lying on your back to sitting on the side of a flat bed without using bedrails?: A Little Help needed moving to and from a bed to a chair (including a wheelchair)?: A Little Help needed standing up from a chair using your arms (e.g., wheelchair or bedside chair)?: Total Help needed to walk in hospital room?: Total Help needed climbing 3-5 steps with a railing? : Total 6 Click Score: 12    End of Session Equipment Utilized During Treatment: Gait belt(maximove sling/lift and maxislide, also used bariatric WC)     Nurse Communication: Mobility status(informed nursing to use sling back to bed.) PT Visit Diagnosis: Muscle weakness (generalized) (M62.81);History of falling (Z91.81)     Time: 1340-1425 PT Time Calculation (min) (ACUTE ONLY):  45 min  Charges:  $Wheel Chair Management: 8-22 mins                     Erasmo Leventhal , PTA Acute Rehabilitation Services Pager [phone] Office [place] Lailie Smead 11/09/2019, 4:25 PM

## 2019-11-10 DIAGNOSIS — M25561 Pain in right knee: Secondary | ICD-10-CM | POA: Diagnosis not present

## 2019-11-10 DIAGNOSIS — I10 Essential (primary) hypertension: Secondary | ICD-10-CM | POA: Diagnosis not present

## 2019-11-10 DIAGNOSIS — E119 Type 2 diabetes mellitus without complications: Secondary | ICD-10-CM | POA: Diagnosis not present

## 2019-11-10 DIAGNOSIS — I480 Paroxysmal atrial fibrillation: Secondary | ICD-10-CM | POA: Diagnosis not present

## 2019-11-16 DIAGNOSIS — M25561 Pain in right knee: Secondary | ICD-10-CM | POA: Diagnosis not present

## 2019-11-16 DIAGNOSIS — M6281 Muscle weakness (generalized): Secondary | ICD-10-CM | POA: Diagnosis not present

## 2019-11-16 DIAGNOSIS — R140 Abdominal distension (gaseous): Secondary | ICD-10-CM | POA: Diagnosis not present

## 2019-11-16 DIAGNOSIS — R197 Diarrhea, unspecified: Secondary | ICD-10-CM | POA: Diagnosis not present

## 2019-11-16 DIAGNOSIS — R141 Gas pain: Secondary | ICD-10-CM | POA: Diagnosis not present

## 2019-11-18 ENCOUNTER — Inpatient Hospital Stay: Payer: Medicare Other | Admitting: Surgical

## 2019-11-24 DIAGNOSIS — E1142 Type 2 diabetes mellitus with diabetic polyneuropathy: Secondary | ICD-10-CM | POA: Diagnosis not present

## 2019-11-24 DIAGNOSIS — E876 Hypokalemia: Secondary | ICD-10-CM | POA: Diagnosis not present

## 2019-11-24 DIAGNOSIS — D649 Anemia, unspecified: Secondary | ICD-10-CM | POA: Diagnosis not present

## 2019-11-25 ENCOUNTER — Inpatient Hospital Stay: Payer: Medicare Other | Admitting: Surgical

## 2019-11-29 DIAGNOSIS — R110 Nausea: Secondary | ICD-10-CM | POA: Diagnosis not present

## 2019-11-29 DIAGNOSIS — E876 Hypokalemia: Secondary | ICD-10-CM | POA: Diagnosis not present

## 2019-11-29 DIAGNOSIS — I5042 Chronic combined systolic (congestive) and diastolic (congestive) heart failure: Secondary | ICD-10-CM | POA: Diagnosis not present

## 2019-11-29 DIAGNOSIS — R197 Diarrhea, unspecified: Secondary | ICD-10-CM | POA: Diagnosis not present

## 2019-12-01 DIAGNOSIS — R141 Gas pain: Secondary | ICD-10-CM | POA: Diagnosis not present

## 2019-12-01 DIAGNOSIS — R197 Diarrhea, unspecified: Secondary | ICD-10-CM | POA: Diagnosis not present

## 2019-12-01 DIAGNOSIS — R110 Nausea: Secondary | ICD-10-CM | POA: Diagnosis not present

## 2019-12-01 DIAGNOSIS — E876 Hypokalemia: Secondary | ICD-10-CM | POA: Diagnosis not present

## 2019-12-02 ENCOUNTER — Ambulatory Visit (INDEPENDENT_AMBULATORY_CARE_PROVIDER_SITE_OTHER): Payer: Medicare Other | Admitting: Surgical

## 2019-12-02 ENCOUNTER — Telehealth: Payer: Self-pay

## 2019-12-02 DIAGNOSIS — S86811A Strain of other muscle(s) and tendon(s) at lower leg level, right leg, initial encounter: Secondary | ICD-10-CM

## 2019-12-02 DIAGNOSIS — S76112A Strain of left quadriceps muscle, fascia and tendon, initial encounter: Secondary | ICD-10-CM

## 2019-12-02 NOTE — Telephone Encounter (Signed)
Patient seen in office today for his post op check. I talked with Kipp Brood from [employer] who will assist [place] in getting patient set up with CPM while he remains at [facility] and then will assist with getting them transferred to his home. Provided Kipp Brood with information for patient.

## 2019-12-05 DIAGNOSIS — E876 Hypokalemia: Secondary | ICD-10-CM | POA: Diagnosis not present

## 2019-12-05 DIAGNOSIS — R197 Diarrhea, unspecified: Secondary | ICD-10-CM | POA: Diagnosis not present

## 2019-12-05 DIAGNOSIS — R112 Nausea with vomiting, unspecified: Secondary | ICD-10-CM | POA: Diagnosis not present

## 2019-12-06 DIAGNOSIS — R5381 Other malaise: Secondary | ICD-10-CM | POA: Diagnosis not present

## 2019-12-06 DIAGNOSIS — R262 Difficulty in walking, not elsewhere classified: Secondary | ICD-10-CM | POA: Diagnosis not present

## 2019-12-06 DIAGNOSIS — M6281 Muscle weakness (generalized): Secondary | ICD-10-CM | POA: Diagnosis not present

## 2019-12-06 DIAGNOSIS — M25569 Pain in unspecified knee: Secondary | ICD-10-CM | POA: Diagnosis not present

## 2019-12-08 ENCOUNTER — Telehealth: Payer: Self-pay | Admitting: Orthopedic Surgery

## 2019-12-08 DIAGNOSIS — M25569 Pain in unspecified knee: Secondary | ICD-10-CM | POA: Diagnosis not present

## 2019-12-08 DIAGNOSIS — M6281 Muscle weakness (generalized): Secondary | ICD-10-CM | POA: Diagnosis not present

## 2019-12-08 DIAGNOSIS — S86821D Laceration of other muscle(s) and tendon(s) at lower leg level, right leg, subsequent encounter: Secondary | ICD-10-CM | POA: Diagnosis not present

## 2019-12-08 DIAGNOSIS — R197 Diarrhea, unspecified: Secondary | ICD-10-CM | POA: Diagnosis not present

## 2019-12-08 DIAGNOSIS — S76102D Unspecified injury of left quadriceps muscle, fascia and tendon, subsequent encounter: Secondary | ICD-10-CM | POA: Diagnosis not present

## 2019-12-08 NOTE — Telephone Encounter (Signed)
Patient called requesting a call back. Patient states he has medical questions and concerns for Dr. August Saucer or nurse. Patient phone  number is [phone]. Patient also is requesting CPN machine for home. Please call patient at above phone number

## 2019-12-08 NOTE — Telephone Encounter (Signed)
IC s/w patient at length, He was having issues with getting transportation to [facility] to be fitted for bledsoe braces. I spoke with his nurse at the facility and stressed importance of this and advised that they needed to get with [facility] and make arrangements to have patient transported.

## 2019-12-09 DIAGNOSIS — M6281 Muscle weakness (generalized): Secondary | ICD-10-CM | POA: Diagnosis not present

## 2019-12-12 ENCOUNTER — Telehealth: Payer: Self-pay | Admitting: Orthopedic Surgery

## 2019-12-12 NOTE — Telephone Encounter (Signed)
ok 

## 2019-12-12 NOTE — Telephone Encounter (Signed)
Pt called stating he needs Dr.Dean to send a rx for a ramp to [facility] in order for them to bring it. Pt would like a CB when this has been sent in.   [facility] [phone]  [phone]

## 2019-12-12 NOTE — Telephone Encounter (Signed)
Tried calling patient to advise faxed. No answer

## 2019-12-12 NOTE — Telephone Encounter (Signed)
Ok for this? 

## 2019-12-16 ENCOUNTER — Telehealth: Payer: Self-pay | Admitting: Orthopedic Surgery

## 2019-12-16 MED ORDER — TRAMADOL HCL 50 MG PO TABS: 50.0000 mg | ORAL_TABLET | Freq: Three times a day (TID) | ORAL | 0 refills | Status: DC | PRN

## 2019-12-16 NOTE — Telephone Encounter (Signed)
Please advise 

## 2019-12-16 NOTE — Telephone Encounter (Signed)
rx for Bledsoe braces faxed to [facility]. Rx for walker sent to [facility]. Tramadol called to pharmacy. Patient aware.

## 2019-12-16 NOTE — Telephone Encounter (Signed)
Pt states he has been having pains in his knee and would like to know if something can be called in? pt would also like a rx for a walker and a copy of his rx for knee braces faxed to [employer]?  [phone]

## 2019-12-16 NOTE — Telephone Encounter (Signed)
Rx done for Bledsoe braces and walker.  Also prescription for tramadol okay.  He should be coming back sometime soon for clinical recheck

## 2019-12-20 ENCOUNTER — Encounter: Payer: Self-pay | Admitting: Surgical

## 2019-12-20 NOTE — Progress Notes (Signed)
Post-Op Visit Note   Patient: Douglas [name]           Date of Birth: 1971/04/29           MRN: [id number] Visit Date: 12/02/2019 PCP: Toma Deiters, MD   Assessment & Plan:  Chief Complaint:  Chief Complaint  Patient presents with  . Right Knee - Pain  . Left Knee - Pain   Visit Diagnoses:  1. Rupture of left quadriceps tendon, initial encounter   2. Rupture of patellar tendon, right, initial encounter     Plan: Patient is a 49 year old male who presents s/p quad and patellar tendon repair bilaterally on 11/01/2019.  He has been in skilled nursing facility since his surgery.  He states that his pain is well controlled but he has been feeling "down" about being unable to walk.  On exam incisions are healing well.  Right knee has 0 degrees of extension and 40 degrees of flexion.  Left knee has 0 degrees of extension to 50 degrees of flexion.  He has no calf tenderness bilaterally.  Negative Homans' sign bilaterally.  Denies any shortness of breath.  Sutures were removed today and replaced with Steri-Strips.  Patient will be set up with CPM machine to use in skilled nursing facility.  He was also given prescription for bilateral Bledsoe braces.  Plan for patient to begin weightbearing in 2 weeks.  He will remain in the Bledsoe braces at all times when he is ambulatory.  Follow-up in 3 weeks for clinical recheck with Dr. August Saucer.  Follow-Up Instructions: No follow-ups on file.   Orders:  No orders of the defined types were placed in this encounter.  No orders of the defined types were placed in this encounter.   Imaging: No results found.  PMFS History: Patient Active Problem List   Diagnosis Date Noted  . Hypertension 10/20/2019  . Sleep apnea 10/20/2019  . Atrial fibrillation (HCC) 10/20/2019  . Knee pain 10/20/2019  . Diabetes mellitus without complication (HCC)   . CHF (congestive heart failure) (HCC)   . Gout   . Rupture of left quadriceps tendon   . Rupture of  patellar tendon, right, initial encounter   . ATN (acute tubular necrosis) (HCC)    Past Medical History:  Diagnosis Date  . CHF (congestive heart failure) (HCC)   . Degenerative arthritis   . Depression   . Diabetes mellitus without complication (HCC)   . GERD (gastroesophageal reflux disease)   . Gout   . HLD (hyperlipidemia)   . Hypertension   . Seasonal allergies     No family history on file.  Past Surgical History:  Procedure Laterality Date  . KNEE ARTHROSCOPY Left 11/01/2019   Procedure: ARTHROSCOPY KNEE AND MENISCUS DEBRIDEMENT;  Surgeon: Cammy Copa, MD;  Location: [facility] OR;  Service: Orthopedics;  Laterality: Left;  . KNEE ARTHROSCOPY WITH PATELLAR TENDON REPAIR Right 11/01/2019   Procedure: KNEE ARTHROSCOPY WITH PATELLAR TENDON REPAIR;  Surgeon: Cammy Copa, MD;  Location: [facility] OR;  Service: Orthopedics;  Laterality: Right;  . TENDON REPAIR Left 11/01/2019   Procedure: QUADRICEP TENDON REPAIR;  Surgeon: Cammy Copa, MD;  Location: [facility] OR;  Service: Orthopedics;  Laterality: Left;   Social History   Occupational History  . Occupation: disabled  Tobacco Use  . Smoking status: Former Smoker    Quit date: 06/16/2014    Years since quitting: 5.5  . Smokeless tobacco: Never Used  Vaping Use  . Vaping Use:  Never used  Substance and Sexual Activity  . Alcohol use: Not Currently  . Drug use: Never  . Sexual activity: Not Currently

## 2019-12-22 DIAGNOSIS — I5032 Chronic diastolic (congestive) heart failure: Secondary | ICD-10-CM | POA: Diagnosis not present

## 2019-12-22 DIAGNOSIS — G4730 Sleep apnea, unspecified: Secondary | ICD-10-CM | POA: Diagnosis not present

## 2019-12-22 DIAGNOSIS — Z7984 Long term (current) use of oral hypoglycemic drugs: Secondary | ICD-10-CM | POA: Diagnosis not present

## 2019-12-22 DIAGNOSIS — S76112D Strain of left quadriceps muscle, fascia and tendon, subsequent encounter: Secondary | ICD-10-CM | POA: Diagnosis not present

## 2019-12-22 DIAGNOSIS — I110 Hypertensive heart disease with heart failure: Secondary | ICD-10-CM | POA: Diagnosis not present

## 2019-12-22 DIAGNOSIS — Z9181 History of falling: Secondary | ICD-10-CM | POA: Diagnosis not present

## 2019-12-22 DIAGNOSIS — Z7901 Long term (current) use of anticoagulants: Secondary | ICD-10-CM | POA: Diagnosis not present

## 2019-12-22 DIAGNOSIS — I480 Paroxysmal atrial fibrillation: Secondary | ICD-10-CM | POA: Diagnosis not present

## 2019-12-22 DIAGNOSIS — N170 Acute kidney failure with tubular necrosis: Secondary | ICD-10-CM | POA: Diagnosis not present

## 2019-12-22 DIAGNOSIS — G4733 Obstructive sleep apnea (adult) (pediatric): Secondary | ICD-10-CM | POA: Diagnosis not present

## 2019-12-22 DIAGNOSIS — M109 Gout, unspecified: Secondary | ICD-10-CM | POA: Diagnosis not present

## 2019-12-22 DIAGNOSIS — E119 Type 2 diabetes mellitus without complications: Secondary | ICD-10-CM | POA: Diagnosis not present

## 2019-12-22 DIAGNOSIS — S76111D Strain of right quadriceps muscle, fascia and tendon, subsequent encounter: Secondary | ICD-10-CM | POA: Diagnosis not present

## 2019-12-23 ENCOUNTER — Telehealth: Payer: Self-pay | Admitting: Orthopedic Surgery

## 2019-12-23 ENCOUNTER — Telehealth: Payer: Self-pay

## 2019-12-23 DIAGNOSIS — I480 Paroxysmal atrial fibrillation: Secondary | ICD-10-CM | POA: Diagnosis not present

## 2019-12-23 DIAGNOSIS — I110 Hypertensive heart disease with heart failure: Secondary | ICD-10-CM | POA: Diagnosis not present

## 2019-12-23 DIAGNOSIS — Z7901 Long term (current) use of anticoagulants: Secondary | ICD-10-CM | POA: Diagnosis not present

## 2019-12-23 DIAGNOSIS — Z7984 Long term (current) use of oral hypoglycemic drugs: Secondary | ICD-10-CM | POA: Diagnosis not present

## 2019-12-23 DIAGNOSIS — G4733 Obstructive sleep apnea (adult) (pediatric): Secondary | ICD-10-CM | POA: Diagnosis not present

## 2019-12-23 DIAGNOSIS — G4730 Sleep apnea, unspecified: Secondary | ICD-10-CM | POA: Diagnosis not present

## 2019-12-23 DIAGNOSIS — I5032 Chronic diastolic (congestive) heart failure: Secondary | ICD-10-CM | POA: Diagnosis not present

## 2019-12-23 DIAGNOSIS — Z9181 History of falling: Secondary | ICD-10-CM | POA: Diagnosis not present

## 2019-12-23 DIAGNOSIS — N170 Acute kidney failure with tubular necrosis: Secondary | ICD-10-CM | POA: Diagnosis not present

## 2019-12-23 DIAGNOSIS — S76112D Strain of left quadriceps muscle, fascia and tendon, subsequent encounter: Secondary | ICD-10-CM | POA: Diagnosis not present

## 2019-12-23 DIAGNOSIS — M109 Gout, unspecified: Secondary | ICD-10-CM | POA: Diagnosis not present

## 2019-12-23 DIAGNOSIS — E119 Type 2 diabetes mellitus without complications: Secondary | ICD-10-CM | POA: Diagnosis not present

## 2019-12-23 DIAGNOSIS — S76111D Strain of right quadriceps muscle, fascia and tendon, subsequent encounter: Secondary | ICD-10-CM | POA: Diagnosis not present

## 2019-12-23 NOTE — Telephone Encounter (Signed)
Maloii with [facility] called stating for OT the pt was only in [place] and if wanted can apply for OT again it will just have to be later.   [phone]

## 2019-12-23 NOTE — Telephone Encounter (Signed)
IC LMVM per Dr August Saucer to schedule follow up appt. Patient needs repeat eval

## 2019-12-23 NOTE — Telephone Encounter (Signed)
FYI

## 2019-12-27 DIAGNOSIS — S76112A Strain of left quadriceps muscle, fascia and tendon, initial encounter: Secondary | ICD-10-CM | POA: Diagnosis not present

## 2019-12-27 DIAGNOSIS — M6281 Muscle weakness (generalized): Secondary | ICD-10-CM | POA: Diagnosis not present

## 2019-12-28 DIAGNOSIS — S76102A Unspecified injury of left quadriceps muscle, fascia and tendon, initial encounter: Secondary | ICD-10-CM | POA: Diagnosis not present

## 2019-12-28 DIAGNOSIS — S76101A Unspecified injury of right quadriceps muscle, fascia and tendon, initial encounter: Secondary | ICD-10-CM | POA: Diagnosis not present

## 2020-01-06 ENCOUNTER — Ambulatory Visit (INDEPENDENT_AMBULATORY_CARE_PROVIDER_SITE_OTHER): Payer: Medicare Other | Admitting: Orthopedic Surgery

## 2020-01-06 DIAGNOSIS — S86811A Strain of other muscle(s) and tendon(s) at lower leg level, right leg, initial encounter: Secondary | ICD-10-CM

## 2020-01-06 DIAGNOSIS — S76112A Strain of left quadriceps muscle, fascia and tendon, initial encounter: Secondary | ICD-10-CM

## 2020-01-07 ENCOUNTER — Encounter: Payer: Self-pay | Admitting: Orthopedic Surgery

## 2020-01-07 NOTE — Progress Notes (Signed)
Post-Op Visit Note   Patient: Douglas [name]           Date of Birth: 10-12-1970           MRN: [id number] Visit Date: 01/06/2020 PCP: Toma Deiters, MD   Assessment & Plan:  Chief Complaint:  Chief Complaint  Patient presents with  . Post-op Follow-up   Visit Diagnoses:  1. Rupture of left quadriceps tendon, initial encounter   2. Rupture of patellar tendon, right, initial encounter     Plan: Deep is now about 8 weeks out right patellar tendon rupture repair and left quad tendon rupture repair.  He did receive Bledsoe braces about a week ago.  Here to discuss weightbearing status.  He has been doing home health physical therapy.  On examination he is able to do multiple straight leg raises on the left with only about a 5 to 10 degree extension lag.  On the right-hand side he is able to do it with about a 15 degree extensor lag.  Both extensor mechanisms are taut and functional with leg extension.  No calf tenderness negative Homans bilaterally.  Plan at this time is weightbearing as tolerated with the Bledsoe braces locked in extension.  Okay to change the braces from full extension to locked them in 0 to 15 degrees of flexion in 1 week.  We will reassess him in 2 weeks to increase the Bledsoe brace flexion in both knees.  In general passively in both legs he has improving range of motion.  Okay not to wear the braces when he is not walking.  Follow-Up Instructions: No follow-ups on file.   Orders:  No orders of the defined types were placed in this encounter.  No orders of the defined types were placed in this encounter.   Imaging: No results found.  PMFS History: Patient Active Problem List   Diagnosis Date Noted  . Hypertension 10/20/2019  . Sleep apnea 10/20/2019  . Atrial fibrillation (HCC) 10/20/2019  . Knee pain 10/20/2019  . Diabetes mellitus without complication (HCC)   . CHF (congestive heart failure) (HCC)   . Gout   . Rupture of left  quadriceps tendon   . Rupture of patellar tendon, right, initial encounter   . ATN (acute tubular necrosis) (HCC)    Past Medical History:  Diagnosis Date  . CHF (congestive heart failure) (HCC)   . Degenerative arthritis   . Depression   . Diabetes mellitus without complication (HCC)   . GERD (gastroesophageal reflux disease)   . Gout   . HLD (hyperlipidemia)   . Hypertension   . Seasonal allergies     No family history on file.  Past Surgical History:  Procedure Laterality Date  . KNEE ARTHROSCOPY Left 11/01/2019   Procedure: ARTHROSCOPY KNEE AND MENISCUS DEBRIDEMENT;  Surgeon: Cammy Copa, MD;  Location: [facility] OR;  Service: Orthopedics;  Laterality: Left;  . KNEE ARTHROSCOPY WITH PATELLAR TENDON REPAIR Right 11/01/2019   Procedure: KNEE ARTHROSCOPY WITH PATELLAR TENDON REPAIR;  Surgeon: Cammy Copa, MD;  Location: [facility] OR;  Service: Orthopedics;  Laterality: Right;  . TENDON REPAIR Left 11/01/2019   Procedure: QUADRICEP TENDON REPAIR;  Surgeon: Cammy Copa, MD;  Location: [facility] OR;  Service: Orthopedics;  Laterality: Left;   Social History   Occupational History  . Occupation: disabled  Tobacco Use  . Smoking status: Former Smoker    Quit date: 06/16/2014    Years since quitting: 5.5  . Smokeless tobacco: Never  Used  Vaping Use  . Vaping Use: Never used  Substance and Sexual Activity  . Alcohol use: Not Currently  . Drug use: Never  . Sexual activity: Not Currently

## 2020-01-08 DIAGNOSIS — M6281 Muscle weakness (generalized): Secondary | ICD-10-CM | POA: Diagnosis not present

## 2020-01-13 ENCOUNTER — Telehealth: Payer: Self-pay | Admitting: Orthopedic Surgery

## 2020-01-13 DIAGNOSIS — G4733 Obstructive sleep apnea (adult) (pediatric): Secondary | ICD-10-CM | POA: Diagnosis not present

## 2020-01-13 DIAGNOSIS — Z7984 Long term (current) use of oral hypoglycemic drugs: Secondary | ICD-10-CM | POA: Diagnosis not present

## 2020-01-13 DIAGNOSIS — G4730 Sleep apnea, unspecified: Secondary | ICD-10-CM | POA: Diagnosis not present

## 2020-01-13 DIAGNOSIS — E119 Type 2 diabetes mellitus without complications: Secondary | ICD-10-CM | POA: Diagnosis not present

## 2020-01-13 DIAGNOSIS — I480 Paroxysmal atrial fibrillation: Secondary | ICD-10-CM | POA: Diagnosis not present

## 2020-01-13 DIAGNOSIS — N170 Acute kidney failure with tubular necrosis: Secondary | ICD-10-CM | POA: Diagnosis not present

## 2020-01-13 DIAGNOSIS — Z7901 Long term (current) use of anticoagulants: Secondary | ICD-10-CM | POA: Diagnosis not present

## 2020-01-13 DIAGNOSIS — Z9181 History of falling: Secondary | ICD-10-CM | POA: Diagnosis not present

## 2020-01-13 DIAGNOSIS — S76112D Strain of left quadriceps muscle, fascia and tendon, subsequent encounter: Secondary | ICD-10-CM | POA: Diagnosis not present

## 2020-01-13 DIAGNOSIS — M109 Gout, unspecified: Secondary | ICD-10-CM | POA: Diagnosis not present

## 2020-01-13 DIAGNOSIS — S76111D Strain of right quadriceps muscle, fascia and tendon, subsequent encounter: Secondary | ICD-10-CM | POA: Diagnosis not present

## 2020-01-13 DIAGNOSIS — I5032 Chronic diastolic (congestive) heart failure: Secondary | ICD-10-CM | POA: Diagnosis not present

## 2020-01-13 DIAGNOSIS — I110 Hypertensive heart disease with heart failure: Secondary | ICD-10-CM | POA: Diagnosis not present

## 2020-01-13 NOTE — Telephone Encounter (Signed)
Put up front for patient to pick up.  

## 2020-01-13 NOTE — Telephone Encounter (Signed)
Patient called.  He is requesting an Rx for a 3 in 1 bedside commode  Call back: [phone]

## 2020-01-18 DIAGNOSIS — N170 Acute kidney failure with tubular necrosis: Secondary | ICD-10-CM | POA: Diagnosis not present

## 2020-01-18 DIAGNOSIS — Z7901 Long term (current) use of anticoagulants: Secondary | ICD-10-CM | POA: Diagnosis not present

## 2020-01-18 DIAGNOSIS — S76111D Strain of right quadriceps muscle, fascia and tendon, subsequent encounter: Secondary | ICD-10-CM | POA: Diagnosis not present

## 2020-01-18 DIAGNOSIS — Z9181 History of falling: Secondary | ICD-10-CM | POA: Diagnosis not present

## 2020-01-18 DIAGNOSIS — G4730 Sleep apnea, unspecified: Secondary | ICD-10-CM | POA: Diagnosis not present

## 2020-01-18 DIAGNOSIS — E119 Type 2 diabetes mellitus without complications: Secondary | ICD-10-CM | POA: Diagnosis not present

## 2020-01-18 DIAGNOSIS — S76112D Strain of left quadriceps muscle, fascia and tendon, subsequent encounter: Secondary | ICD-10-CM | POA: Diagnosis not present

## 2020-01-18 DIAGNOSIS — Z7984 Long term (current) use of oral hypoglycemic drugs: Secondary | ICD-10-CM | POA: Diagnosis not present

## 2020-01-18 DIAGNOSIS — M109 Gout, unspecified: Secondary | ICD-10-CM | POA: Diagnosis not present

## 2020-01-18 DIAGNOSIS — G4733 Obstructive sleep apnea (adult) (pediatric): Secondary | ICD-10-CM | POA: Diagnosis not present

## 2020-01-18 DIAGNOSIS — I110 Hypertensive heart disease with heart failure: Secondary | ICD-10-CM | POA: Diagnosis not present

## 2020-01-18 DIAGNOSIS — I5032 Chronic diastolic (congestive) heart failure: Secondary | ICD-10-CM | POA: Diagnosis not present

## 2020-01-18 DIAGNOSIS — I480 Paroxysmal atrial fibrillation: Secondary | ICD-10-CM | POA: Diagnosis not present

## 2020-01-27 ENCOUNTER — Encounter: Payer: Self-pay | Admitting: Orthopedic Surgery

## 2020-01-27 ENCOUNTER — Ambulatory Visit (INDEPENDENT_AMBULATORY_CARE_PROVIDER_SITE_OTHER): Payer: Medicare Other | Admitting: Orthopedic Surgery

## 2020-01-27 DIAGNOSIS — S86811A Strain of other muscle(s) and tendon(s) at lower leg level, right leg, initial encounter: Secondary | ICD-10-CM

## 2020-01-27 DIAGNOSIS — S76112A Strain of left quadriceps muscle, fascia and tendon, initial encounter: Secondary | ICD-10-CM

## 2020-01-27 DIAGNOSIS — M6281 Muscle weakness (generalized): Secondary | ICD-10-CM | POA: Diagnosis not present

## 2020-01-27 DIAGNOSIS — R6889 Other general symptoms and signs: Secondary | ICD-10-CM | POA: Diagnosis not present

## 2020-01-27 NOTE — Progress Notes (Signed)
Post-Op Visit Note   Patient: Douglas [name]           Date of Birth: Apr 01, 1971           MRN: [id number] Visit Date: 01/27/2020 PCP: Toma Deiters, MD   Assessment & Plan:  Chief Complaint:  Chief Complaint  Patient presents with  . Left Knee - Follow-up    11/01/2019 Left quad tendon repair  . Right Knee - Follow-up    11/01/2019 right patellar tendon repair   Visit Diagnoses:  1. Rupture of left quadriceps tendon, initial encounter   2. Rupture of patellar tendon, right, initial encounter     Plan: Douglas [name] is a 49 year old patient 2-1/2 months out left quad tendon repair and right patellar tendon repair. He has been weightbearing few steps and Bledsoe braces. On exam full extension on the left with 90 degrees of flexion on the right he has a 20 degree extensor lag but the repair is intact. Plan is home health therapy note to continue to work on range of motion and strengthening. Outpatient physical therapy here to start next week 2-3 times a week for 6 weeks. Okay to weight-bear as tolerated on the left and right extremity. He needs brace on that right-hand side for ambulation and that set to 60 degrees. Okay for extensor mechanism strengthening and gait training at this time. Follow-up with Douglas [name] in 3 weeks for clinical recheck negative calf tenderness negative Homans today.  Follow-Up Instructions: Return in about 3 weeks (around 02/17/2020).   Orders:  Orders Placed This Encounter  Procedures  . Ambulatory referral to Physical Therapy   No orders of the defined types were placed in this encounter.   Imaging: No results found.  PMFS History: Patient Active Problem List   Diagnosis Date Noted  . Hypertension 10/20/2019  . Sleep apnea 10/20/2019  . Atrial fibrillation (HCC) 10/20/2019  . Knee pain 10/20/2019  . Diabetes mellitus without complication (HCC)   . CHF (congestive heart failure) (HCC)   . Gout   . Rupture of left quadriceps tendon   . Rupture of  patellar tendon, right, initial encounter   . ATN (acute tubular necrosis) (HCC)    Past Medical History:  Diagnosis Date  . CHF (congestive heart failure) (HCC)   . Degenerative arthritis   . Depression   . Diabetes mellitus without complication (HCC)   . GERD (gastroesophageal reflux disease)   . Gout   . HLD (hyperlipidemia)   . Hypertension   . Seasonal allergies     History reviewed. No pertinent family history.  Past Surgical History:  Procedure Laterality Date  . KNEE ARTHROSCOPY Left 11/01/2019   Procedure: ARTHROSCOPY KNEE AND MENISCUS DEBRIDEMENT;  Surgeon: Cammy Copa, MD;  Location: [facility] OR;  Service: Orthopedics;  Laterality: Left;  . KNEE ARTHROSCOPY WITH PATELLAR TENDON REPAIR Right 11/01/2019   Procedure: KNEE ARTHROSCOPY WITH PATELLAR TENDON REPAIR;  Surgeon: Cammy Copa, MD;  Location: [facility] OR;  Service: Orthopedics;  Laterality: Right;  . TENDON REPAIR Left 11/01/2019   Procedure: QUADRICEP TENDON REPAIR;  Surgeon: Cammy Copa, MD;  Location: [facility] OR;  Service: Orthopedics;  Laterality: Left;   Social History   Occupational History  . Occupation: disabled  Tobacco Use  . Smoking status: Former Smoker    Quit date: 06/16/2014    Years since quitting: 5.6  . Smokeless tobacco: Never Used  Vaping Use  . Vaping Use: Never used  Substance and Sexual Activity  .  Alcohol use: Not Currently  . Drug use: Never  . Sexual activity: Not Currently

## 2020-02-01 ENCOUNTER — Other Ambulatory Visit: Payer: Self-pay

## 2020-02-01 ENCOUNTER — Ambulatory Visit: Payer: Medicare Other | Attending: Orthopedic Surgery | Admitting: Physical Therapy

## 2020-02-01 DIAGNOSIS — M25661 Stiffness of right knee, not elsewhere classified: Secondary | ICD-10-CM

## 2020-02-01 DIAGNOSIS — M6281 Muscle weakness (generalized): Secondary | ICD-10-CM | POA: Insufficient documentation

## 2020-02-01 DIAGNOSIS — S76112A Strain of left quadriceps muscle, fascia and tendon, initial encounter: Secondary | ICD-10-CM | POA: Insufficient documentation

## 2020-02-01 DIAGNOSIS — R2689 Other abnormalities of gait and mobility: Secondary | ICD-10-CM | POA: Diagnosis not present

## 2020-02-01 DIAGNOSIS — S86811A Strain of other muscle(s) and tendon(s) at lower leg level, right leg, initial encounter: Secondary | ICD-10-CM | POA: Insufficient documentation

## 2020-02-01 DIAGNOSIS — M25662 Stiffness of left knee, not elsewhere classified: Secondary | ICD-10-CM | POA: Insufficient documentation

## 2020-02-01 NOTE — Therapy (Addendum)
[facility] [address] [place], [place], [address] Phone: [phone]   Fax:  [phone]  Physical Therapy Evaluation  Patient Details  Name: Douglas [name] MRN: [id number] Date of Birth: Mar 24, 1971 Referring Provider (PT): Dorene Grebe   Encounter Date: 02/01/2020   PT End of Session - 02/01/20 1540    Visit Number 1    Number of Visits 12    Date for PT Re-Evaluation 03/14/20    PT Start Time 1535    PT Stop Time 1627    PT Time Calculation (min) 52 min    Activity Tolerance Patient tolerated treatment well    Behavior During Therapy [facility] for tasks assessed/performed           Past Medical History:  Diagnosis Date  . CHF (congestive heart failure) (HCC)   . Degenerative arthritis   . Depression   . Diabetes mellitus without complication (HCC)   . GERD (gastroesophageal reflux disease)   . Gout   . HLD (hyperlipidemia)   . Hypertension   . Seasonal allergies     Past Surgical History:  Procedure Laterality Date  . KNEE ARTHROSCOPY Left 11/01/2019   Procedure: ARTHROSCOPY KNEE AND MENISCUS DEBRIDEMENT;  Surgeon: Cammy Copa, MD;  Location: [facility] OR;  Service: Orthopedics;  Laterality: Left;  . KNEE ARTHROSCOPY WITH PATELLAR TENDON REPAIR Right 11/01/2019   Procedure: KNEE ARTHROSCOPY WITH PATELLAR TENDON REPAIR;  Surgeon: Cammy Copa, MD;  Location: [facility] OR;  Service: Orthopedics;  Laterality: Right;  . TENDON REPAIR Left 11/01/2019   Procedure: QUADRICEP TENDON REPAIR;  Surgeon: Cammy Copa, MD;  Location: [facility] OR;  Service: Orthopedics;  Laterality: Left;    There were no vitals filed for this visit.    Subjective Assessment - 02/01/20 1539    Subjective Pt reports fall in the beginning of April leading to his bilateral knee injury. Pt with L quad tendon repair and R patellar tendon repair on 5/18. Pt states he's been doing okay since surgery. He's been doing weight bearing -- able to  walk very short distance (~2') with RW. Pt states he did HHPT x2 visits and then was recommended to work in [employer]. Pt is to wear R knee brace for 3 more weeks. Wife has been assisting with ADLs.    Patient is accompained by: Family member    Limitations Standing;Walking;House hold activities    How long can you stand comfortably? ~20 sec    How long can you walk comfortably? About 1 to 2 feet    Patient Stated Goals Improve walking    Currently in Pain? No/denies              [facility] PT Assessment - 02/02/20 0001      Assessment   Medical Diagnosis S76.11 Rupture of L quad, S86.811 Rupture of R patellar tendon    Referring Provider (PT) Dorene Grebe    Onset Date/Surgical Date 11/01/19    Prior Therapy None      Precautions   Precautions None;Knee    Required Braces or Orthoses Other Brace/Splint      Restrictions   Weight Bearing Restrictions No      Balance Screen   Has the patient fallen in the past 6 months Yes    How many times? 1    Has the patient had a decrease in activity level because of a fear of falling?  Yes      Home Environment   Living Environment  Private residence    Home Access Ramped entrance    Home Layout Multi-level    Alternate Level Stairs-Number of Steps 3    Home Equipment Walker - 2 wheels;Bedside commode;Wheelchair - manual;Hospital bed      Prior Function   Level of Independence Independent    Vocation On disability      Observation/Other Assessments   Focus on Therapeutic Outcomes (FOTO)  79%      AROM   Right Knee Extension -25    Right Knee Flexion 77    Left Knee Extension -20    Left Knee Flexion 95      PROM   Right Knee Extension -11    Right Knee Flexion 87    Left Knee Extension -5    Left Knee Flexion 100      Strength   Right Hip Flexion 3+/5    Right Hip External Rotation  4+/5    Right Hip Internal Rotation 4+/5    Right Hip ABduction 4-/5    Right Hip ADduction 4-/5    Left Hip Flexion 4+/5    Left Hip External  Rotation 4+/5    Left Hip Internal Rotation 4+/5    Left Hip ABduction 4+/5    Left Hip ADduction 4+/5    Right Knee Flexion 2+/5    Right Knee Extension 2+/5    Left Knee Flexion 3-/5    Left Knee Extension 3-/5      Transfers   Transfers Sit to Stand;Lateral/Scoot Transfers;Supine to Sit;Sit to Supine    Sit to Stand 2: Max assist   with RW from w/c   Sit to Stand Details Tactile cues for sequencing;Tactile cues for weight shifting;Manual facilitation for weight shifting;Manual facilitation for weight bearing    Lateral/Scoot Transfers 5: Supervision    Supine to Sit 4: Min guard    Sit to Supine 4: Min guard                      Objective measurements completed on examination: See above findings.                 PT Short Term Goals - 02/01/20 1733      PT SHORT TERM GOAL #1   Title Pt and caregiver will be independent with HEP    Baseline Newly provided    Time 3    Period Weeks    Status New    Target Date 02/22/20      PT SHORT TERM GOAL #2   Title Pt will be able to perform 5x sit<>stand from w/c with min A    Baseline Unable    Time 3    Period Weeks    Status New    Target Date 02/22/20      PT SHORT TERM GOAL #3   Title Pt will be able to perform 5 SLR with no lag    Baseline Unable    Time 3    Period Weeks    Status New    Target Date 02/22/20      PT SHORT TERM GOAL #4   Title Pt will be able to tolerate standing ~30 sec for grooming task    Baseline Unable    Time 3    Period Weeks    Status New    Target Date 02/22/20             PT Long Term Goals - 02/01/20 1737  PT LONG TERM GOAL #1   Title Pt will be independent with advanced HEP    Time 6    Period Weeks    Status New    Target Date 03/14/20      PT LONG TERM GOAL #2   Title Pt will be able to amb 5' with RW min A to get into his bathroom    Baseline Unable    Time 6    Period Weeks    Status New    Target Date 02/22/20      PT LONG TERM GOAL  #3   Title Pt will be able to perform 5x sit<>stand with SBA    Time 6    Period Weeks    Status New    Target Date 03/14/20      PT LONG TERM GOAL #4   Title Pt will have improved FOTO score to 55%    Baseline 79% limitation    Time 6    Period Weeks    Status New    Target Date 03/14/20      PT LONG TERM GOAL #5   Title Pt will be able to perform stand pivot t/fs mod I with RW    Baseline Unable    Time 6    Period Weeks    Status New    Target Date 03/14/20                  Plan - 02/01/20 1714    Clinical Impression Statement Pt is a 49 y/o M presenting to OPPT s/p L quad tendon repair and R patellar tendon repair on 5/18. Pt received HHPT for 2 visits before ortho recommended for him to come to OPPT. Per ortho pt is WBAT on bilat LE -- brace required for ambulation; okay'd for extension, strengthening, and gait training. Pt currently has decreased bilateral hip and knee strength (weaker quad/hip strength on L vs R LE), decreased knee ROM, with history of morbid obesity; due to these issues he is unable to come to full standing without assist and only able to perform scooting transfers from w/c. Pt reports requiring assist from wife and daughter to perform bed transfers at home and to get in/out of house. Pt currently using w/c for primary mobility. Due to family difficulty with getting pt out of the home, he may benefit from more visits of HHPT prior to OPPT. Will continue to see pt in OPPT pending ortho and family's preference. Anticipating that pt may require more than 6 weeks of therapy.    Personal Factors and Comorbidities Comorbidity 1;Fitness    Comorbidities obesity    Examination-Activity Limitations Bathing;Dressing;Transfers;Bed Mobility;Hygiene/Grooming;Bend;Lift;Squat;Locomotion Level;Caring for Others;Stairs;Carry;Stand;Toileting    Examination-Participation Restrictions Community Activity;Laundry;Driving;Cleaning;Church;Yard Work;Shop;Meal Prep     Stability/Clinical Decision Making Evolving/Moderate complexity    Clinical Decision Making Moderate    Rehab Potential Good    PT Frequency 2x / week    PT Duration 6 weeks    PT Treatment/Interventions ADLs/Self Care Home Management;Aquatic Therapy;Cryotherapy;Electrical Stimulation;Iontophoresis 4mg /ml Dexamethasone;Moist Heat;Ultrasound;Gait training;Stair training;Functional mobility training;Therapeutic activities;Therapeutic exercise;Balance training;Neuromuscular re-education;Patient/family education;Orthotic Fit/Training;Manual techniques;Scar mobilization;Passive range of motion;Taping;Dry needling;Vasopneumatic Device    PT Next Visit Plan Review HEP. Continue knee ROM and strengthening. Work on standing tolerance.    PT Home Exercise Plan Access Code [email or web address]    Recommended Other Services HHPT pending on family preference    Consulted and Agree with Plan of Care Patient  Patient will benefit from skilled therapeutic intervention in order to improve the following deficits and impairments:  Abnormal gait, Decreased balance, Decreased mobility, Difficulty walking, Hypomobility, Decreased range of motion, Improper body mechanics, Obesity, Decreased activity tolerance, Decreased strength, Increased fascial restricitons, Impaired flexibility  Visit Diagnosis: Stiffness of left knee, not elsewhere classified - Plan: PT plan of care cert/re-cert  Stiffness of right knee, not elsewhere classified - Plan: PT plan of care cert/re-cert  Muscle weakness (generalized) - Plan: PT plan of care cert/re-cert  Other abnormalities of gait and mobility - Plan: PT plan of care cert/re-cert  Rupture of left quadriceps tendon, initial encounter - Plan: PT plan of care cert/re-cert  Rupture of patellar tendon, right, initial encounter - Plan: PT plan of care cert/re-cert     Problem List Patient Active Problem List   Diagnosis Date Noted  . Hypertension 10/20/2019  . Sleep apnea  10/20/2019  . Atrial fibrillation (HCC) 10/20/2019  . Knee pain 10/20/2019  . Diabetes mellitus without complication (HCC)   . CHF (congestive heart failure) (HCC)   . Gout   . Rupture of left quadriceps tendon   . Rupture of patellar tendon, right, initial encounter   . ATN (acute tubular necrosis) [facility])     Tremel Setters April Ma L Tabbetha Kutscher PT, DPT 02/02/2020, 4:56 PM  [facility] [address] [place], [place], [address] Phone: [phone]   Fax:  [phone]  Name: Sian Joles MRN: [id number] Date of Birth: [birth year]

## 2020-02-08 DIAGNOSIS — M6281 Muscle weakness (generalized): Secondary | ICD-10-CM | POA: Diagnosis not present

## 2020-02-13 ENCOUNTER — Other Ambulatory Visit: Payer: Self-pay

## 2020-02-13 ENCOUNTER — Ambulatory Visit: Payer: Medicare Other

## 2020-02-13 DIAGNOSIS — R6889 Other general symptoms and signs: Secondary | ICD-10-CM | POA: Diagnosis not present

## 2020-02-13 DIAGNOSIS — M25661 Stiffness of right knee, not elsewhere classified: Secondary | ICD-10-CM

## 2020-02-13 DIAGNOSIS — R2689 Other abnormalities of gait and mobility: Secondary | ICD-10-CM | POA: Diagnosis not present

## 2020-02-13 DIAGNOSIS — M25662 Stiffness of left knee, not elsewhere classified: Secondary | ICD-10-CM

## 2020-02-13 DIAGNOSIS — S86811A Strain of other muscle(s) and tendon(s) at lower leg level, right leg, initial encounter: Secondary | ICD-10-CM | POA: Diagnosis not present

## 2020-02-13 DIAGNOSIS — M6281 Muscle weakness (generalized): Secondary | ICD-10-CM

## 2020-02-13 DIAGNOSIS — S76112A Strain of left quadriceps muscle, fascia and tendon, initial encounter: Secondary | ICD-10-CM

## 2020-02-13 NOTE — Therapy (Signed)
[facility] [address] [place], [place], [address] Phone: [phone]   Fax:  [phone]  Physical Therapy Treatment  Patient Details  Name: Douglas [name] MRN: [id number] Date of Birth: Nov 03, 1970 Referring Provider (PT): Douglas [name]   Encounter Date: 02/13/2020   PT End of Session - 02/13/20 1502    Visit Number 2    Number of Visits 12    Date for PT Re-Evaluation 03/14/20    PT Start Time 0305    PT Stop Time 0350    PT Time Calculation (min) 45 min    Activity Tolerance Patient tolerated treatment well    Behavior During Therapy [facility] for tasks assessed/performed           Past Medical History:  Diagnosis Date  . CHF (congestive heart failure) (HCC)   . Degenerative arthritis   . Depression   . Diabetes mellitus without complication (HCC)   . GERD (gastroesophageal reflux disease)   . Gout   . HLD (hyperlipidemia)   . Hypertension   . Seasonal allergies     Past Surgical History:  Procedure Laterality Date  . KNEE ARTHROSCOPY Left 11/01/2019   Procedure: ARTHROSCOPY KNEE AND MENISCUS DEBRIDEMENT;  Surgeon: Cammy Copa, MD;  Location: [facility] OR;  Service: Orthopedics;  Laterality: Left;  . KNEE ARTHROSCOPY WITH PATELLAR TENDON REPAIR Right 11/01/2019   Procedure: KNEE ARTHROSCOPY WITH PATELLAR TENDON REPAIR;  Surgeon: Cammy Copa, MD;  Location: [facility] OR;  Service: Orthopedics;  Laterality: Right;  . TENDON REPAIR Left 11/01/2019   Procedure: QUADRICEP TENDON REPAIR;  Surgeon: Cammy Copa, MD;  Location: [facility] OR;  Service: Orthopedics;  Laterality: Left;    There were no vitals filed for this visit.   Subjective Assessment - 02/13/20 1507    Subjective Nothing different.  He reports doing the HEP .                             [facility] Adult PT Treatment/Exercise - 02/13/20 0001      Ambulation/Gait   Pre-Gait Activities bars with heel toe step for and back, diagonal  weight shifts. Cues to ease pressure to hands.   Walk in bars x 4  (12 feet),        Exercises   Exercises Knee/Hip      Knee/Hip Exercises: Stretches   Active Hamstring Stretch Right;Left;5 reps    Active Hamstring Stretch Limitations AAROM with strap    Passive Hamstring Stretch Right;Left;2 reps;30 seconds    Other Knee/Hip Stretches Heel slide x 10 RT and LT      Knee/Hip Exercises: Standing   Heel Raises Both;15 reps    Hip Flexion Right;Left;10 reps    Hip Flexion Limitations in bars    Other Standing Knee Exercises short range bilateral hip hinge x 10 2-3 sec hold cued for gluteal contraction   Marching x 12 RT/LT     Knee/Hip Exercises: Supine   Quad Sets Right;Left;10 reps    Short Arc Quad Sets Right;Left;15 reps    Bridges Left;Right;10 reps    Bridges Limitations knees over bolster.    Other Supine Knee/Hip Exercises bent knee raise x 12 RT/LT                    PT Short Term Goals - 02/01/20 1733      PT SHORT TERM GOAL #1   Title Pt and caregiver will  be independent with HEP    Baseline Newly provided    Time 3    Period Weeks    Status New    Target Date 02/22/20      PT SHORT TERM GOAL #2   Title Pt will be able to perform 5x sit<>stand from w/c with min A    Baseline Unable    Time 3    Period Weeks    Status New    Target Date 02/22/20      PT SHORT TERM GOAL #3   Title Pt will be able to perform 5 SLR with no lag    Baseline Unable    Time 3    Period Weeks    Status New    Target Date 02/22/20      PT SHORT TERM GOAL #4   Title Pt will be able to tolerate standing ~30 sec for grooming task    Baseline Unable    Time 3    Period Weeks    Status New    Target Date 02/22/20             PT Long Term Goals - 02/01/20 1737      PT LONG TERM GOAL #1   Title Pt will be independent with advanced HEP    Time 6    Period Weeks    Status New    Target Date 03/14/20      PT LONG TERM GOAL #2   Title Pt will be able to amb 5'  with RW min A to get into his bathroom    Baseline Unable    Time 6    Period Weeks    Status New    Target Date 02/22/20      PT LONG TERM GOAL #3   Title Pt will be able to perform 5x sit<>stand with SBA    Time 6    Period Weeks    Status New    Target Date 03/14/20      PT LONG TERM GOAL #4   Title Pt will have improved FOTO score to 55%    Baseline 79% limitation    Time 6    Period Weeks    Status New    Target Date 03/14/20      PT LONG TERM GOAL #5   Title Pt will be able to perform stand pivot t/fs mod I with RW    Baseline Unable    Time 6    Period Weeks    Status New    Target Date 03/14/20                 Plan - 02/13/20 1502    Clinical Impression Statement I encouraged [name] to increase Performance of HEP and incr time on feet respecting if very sore for PT session today. He gave a good effort and if he continues should progress slowly but incrementally.    PT Treatment/Interventions ADLs/Self Care Home Management;Aquatic Therapy;Cryotherapy;Electrical Stimulation;Iontophoresis 4mg /ml Dexamethasone;Moist Heat;Ultrasound;Gait training;Stair training;Functional mobility training;Therapeutic activities;Therapeutic exercise;Balance training;Neuromuscular re-education;Patient/family education;Orthotic Fit/Training;Manual techniques;Scar mobilization;Passive range of motion;Taping;Dry needling;Vasopneumatic Device    PT Next Visit Plan Continue knee ROM and strengthening. Work on .    PT Home Exercise Plan Access Code [email or web address] ( QS , SAQ, SLR and heel slides with strap, march)    Consulted and Agree with Plan of Care Patient           Patient will benefit from skilled  therapeutic intervention in order to improve the following deficits and impairments:  Abnormal gait, Decreased balance, Decreased mobility, Difficulty walking, Hypomobility, Decreased range of motion, Improper body mechanics, Obesity, Decreased activity tolerance,  Decreased strength, Increased fascial restricitons, Impaired flexibility  Visit Diagnosis: Stiffness of left knee, not elsewhere classified  Stiffness of right knee, not elsewhere classified  Muscle weakness (generalized)  Other abnormalities of gait and mobility  Rupture of left quadriceps tendon, initial encounter  Rupture of patellar tendon, right, initial encounter     Problem List Patient Active Problem List   Diagnosis Date Noted  . Hypertension 10/20/2019  . Sleep apnea 10/20/2019  . Atrial fibrillation (HCC) 10/20/2019  . Knee pain 10/20/2019  . Diabetes mellitus without complication (HCC)   . CHF (congestive heart failure) (HCC)   . Gout   . Rupture of left quadriceps tendon   . Rupture of patellar tendon, right, initial encounter   . ATN (acute tubular necrosis) [facility])     Caprice Red  PT 02/13/2020, 3:57 PM  [facility] [address] [place], [place], [address] Phone: [phone]   Fax:  [phone]  Name: Douglas [name] MRN: [id number] Date of Birth: 08-23-1970

## 2020-02-16 ENCOUNTER — Other Ambulatory Visit: Payer: Self-pay

## 2020-02-16 ENCOUNTER — Ambulatory Visit: Payer: Medicare Other | Attending: Orthopedic Surgery

## 2020-02-16 DIAGNOSIS — M6281 Muscle weakness (generalized): Secondary | ICD-10-CM | POA: Insufficient documentation

## 2020-02-16 DIAGNOSIS — M25662 Stiffness of left knee, not elsewhere classified: Secondary | ICD-10-CM | POA: Diagnosis not present

## 2020-02-16 DIAGNOSIS — R6889 Other general symptoms and signs: Secondary | ICD-10-CM | POA: Diagnosis not present

## 2020-02-16 DIAGNOSIS — R2689 Other abnormalities of gait and mobility: Secondary | ICD-10-CM | POA: Insufficient documentation

## 2020-02-16 DIAGNOSIS — S76112A Strain of left quadriceps muscle, fascia and tendon, initial encounter: Secondary | ICD-10-CM | POA: Insufficient documentation

## 2020-02-16 DIAGNOSIS — S86811A Strain of other muscle(s) and tendon(s) at lower leg level, right leg, initial encounter: Secondary | ICD-10-CM | POA: Diagnosis not present

## 2020-02-16 DIAGNOSIS — M25661 Stiffness of right knee, not elsewhere classified: Secondary | ICD-10-CM | POA: Insufficient documentation

## 2020-02-16 NOTE — Therapy (Signed)
[facility] [address] [place], [place], [address] Phone: [phone]   Fax:  [phone]  Physical Therapy Treatment  Patient Details  Name: Douglas [name] MRN: [id number] Date of Birth: 17-Oct-1970 Referring Provider (PT): Dorene Grebe   Encounter Date: 02/16/2020   PT End of Session - 02/16/20 1553    Visit Number 3    Number of Visits 12    Date for PT Re-Evaluation 03/14/20    PT Start Time 0250    PT Stop Time 0345    PT Time Calculation (min) 55 min    Activity Tolerance Patient tolerated treatment well;Patient limited by fatigue    Behavior During Therapy [facility] for tasks assessed/performed           Past Medical History:  Diagnosis Date  . CHF (congestive heart failure) (HCC)   . Degenerative arthritis   . Depression   . Diabetes mellitus without complication (HCC)   . GERD (gastroesophageal reflux disease)   . Gout   . HLD (hyperlipidemia)   . Hypertension   . Seasonal allergies     Past Surgical History:  Procedure Laterality Date  . KNEE ARTHROSCOPY Left 11/01/2019   Procedure: ARTHROSCOPY KNEE AND MENISCUS DEBRIDEMENT;  Surgeon: Cammy Copa, MD;  [facility];  Service: Orthopedics;  Laterality: Left;  . KNEE ARTHROSCOPY WITH PATELLAR TENDON REPAIR Right 11/01/2019   Procedure: KNEE ARTHROSCOPY WITH PATELLAR TENDON REPAIR;  Surgeon: Cammy Copa, MD;  Location: [facility] OR;  Service: Orthopedics;  Laterality: Right;  . TENDON REPAIR Left 11/01/2019   Procedure: QUADRICEP TENDON REPAIR;  Surgeon: Cammy Copa, MD;  Location: [facility] OR;  Service: Orthopedics;  Laterality: Left;    There were no vitals filed for this visit.   Subjective Assessment - 02/16/20 1559    Subjective No complaints after last session .  could tell he was worked    Currently in Pain? No/denies                             [facility] Adult PT Treatment/Exercise - 02/16/20 0001       Ambulation/Gait   Pre-Gait Activities standing with stepping  front and back . weight shifting and static standing . balance short hip hinge and cues to contract quads and gluteals.       Knee/Hip Exercises: Stretches   Passive Hamstring Stretch Right;Left    Passive Hamstring Stretch Limitations 5 sec x 20 with QS    Other Knee/Hip Stretches Heel slide x 40 RT and LT      Knee/Hip Exercises: Standing   Heel Raises Both;20 reps    Hip Flexion Right;Left;15 reps    Hip Flexion Limitations in bars    Other Standing Knee Exercises short range bilateral hip hinge x 10 2-3 sec hold cued for gluteal contraction      Knee/Hip Exercises: Seated   Long Arc Quad Right;Left;5 sets;10 reps      Knee/Hip Exercises: Supine   Bridges Both;15 reps    Other Supine Knee/Hip Exercises bent knee raise x 20 RT/LT                    PT Short Term Goals - 02/01/20 1733      PT SHORT TERM GOAL #1   Title Pt and caregiver will be independent with HEP    Baseline Newly provided    Time 3    Period Weeks  Status New    Target Date 02/22/20      PT SHORT TERM GOAL #2   Title Pt will be able to perform 5x sit<>stand from w/c with min A    Baseline Unable    Time 3    Period Weeks    Status New    Target Date 02/22/20      PT SHORT TERM GOAL #3   Title Pt will be able to perform 5 SLR with no lag    Baseline Unable    Time 3    Period Weeks    Status New    Target Date 02/22/20      PT SHORT TERM GOAL #4   Title Pt will be able to tolerate standing ~30 sec for grooming task    Baseline Unable    Time 3    Period Weeks    Status New    Target Date 02/22/20             PT Long Term Goals - 02/01/20 1737      PT LONG TERM GOAL #1   Title Pt will be independent with advanced HEP    Time 6    Period Weeks    Status New    Target Date 03/14/20      PT LONG TERM GOAL #2   Title Pt will be able to amb 5' with RW min A to get into his bathroom    Baseline Unable    Time  6    Period Weeks    Status New    Target Date 02/22/20      PT LONG TERM GOAL #3   Title Pt will be able to perform 5x sit<>stand with SBA    Time 6    Period Weeks    Status New    Target Date 03/14/20      PT LONG TERM GOAL #4   Title Pt will have improved FOTO score to 55%    Baseline 79% limitation    Time 6    Period Weeks    Status New    Target Date 03/14/20      PT LONG TERM GOAL #5   Title Pt will be able to perform stand pivot t/fs mod I with RW    Baseline Unable    Time 6    Period Weeks    Status New    Target Date 03/14/20                 Plan - 02/16/20 1554    Clinical Impression Statement Douglas [name] works har during the session. He  was tired after more mat work reps so was more unsteady on feet near the end of the sesion. He reported fatigus and was ready to be done when we stopped.  Will focus more on quads next session nad may incorporate ES    PT Treatment/Interventions ADLs/Self Care Home Management;Aquatic Therapy;Cryotherapy;Electrical Stimulation;Iontophoresis 4mg /ml Dexamethasone;Moist Heat;Ultrasound;Gait training;Stair training;Functional mobility training;Therapeutic activities;Therapeutic exercise;Balance training;Neuromuscular re-education;Patient/family education;Orthotic Fit/Training;Manual techniques;Scar mobilization;Passive range of motion;Taping;Dry needling;Vasopneumatic Device    PT Next Visit Plan Continue knee ROM and strengthening. Work on .  electric stim with quad exercises    PT Home Exercise Plan Access Code [email or web address] ( QS , SAQ, SLR and heel slides with strap, march)    Consulted and Agree with Plan of Care Patient           Patient will benefit from skilled therapeutic  intervention in order to improve the following deficits and impairments:  Abnormal gait, Decreased balance, Decreased mobility, Difficulty walking, Hypomobility, Decreased range of motion, Improper body mechanics, Obesity, Decreased  activity tolerance, Decreased strength, Increased fascial restricitons, Impaired flexibility  Visit Diagnosis: Stiffness of left knee, not elsewhere classified  Muscle weakness (generalized)  Stiffness of right knee, not elsewhere classified  Other abnormalities of gait and mobility  Rupture of left quadriceps tendon, initial encounter  Rupture of patellar tendon, right, initial encounter     Problem List Patient Active Problem List   Diagnosis Date Noted  . Hypertension 10/20/2019  . Sleep apnea 10/20/2019  . Atrial fibrillation (HCC) 10/20/2019  . Knee pain 10/20/2019  . Diabetes mellitus without complication (HCC)   . CHF (congestive heart failure) (HCC)   . Gout   . Rupture of left quadriceps tendon   . Rupture of patellar tendon, right, initial encounter   . ATN (acute tubular necrosis) [facility])     Caprice Red   PT          02/16/2020, 4:01 PM  [facility] [address] [place], [place], [address] Phone: [phone]   Fax:  [phone]  Name: Douglas [name] MRN: [id number] Date of Birth: May 11, 1971

## 2020-02-17 ENCOUNTER — Ambulatory Visit: Payer: Medicare Other | Admitting: Surgical

## 2020-02-17 DIAGNOSIS — R6889 Other general symptoms and signs: Secondary | ICD-10-CM | POA: Diagnosis not present

## 2020-02-21 ENCOUNTER — Ambulatory Visit: Payer: Medicare Other

## 2020-02-23 ENCOUNTER — Ambulatory Visit: Payer: Medicare Other

## 2020-02-23 ENCOUNTER — Other Ambulatory Visit: Payer: Self-pay

## 2020-02-23 DIAGNOSIS — S76112A Strain of left quadriceps muscle, fascia and tendon, initial encounter: Secondary | ICD-10-CM | POA: Diagnosis not present

## 2020-02-23 DIAGNOSIS — R2689 Other abnormalities of gait and mobility: Secondary | ICD-10-CM | POA: Diagnosis not present

## 2020-02-23 DIAGNOSIS — M25662 Stiffness of left knee, not elsewhere classified: Secondary | ICD-10-CM | POA: Diagnosis not present

## 2020-02-23 DIAGNOSIS — M6281 Muscle weakness (generalized): Secondary | ICD-10-CM

## 2020-02-23 DIAGNOSIS — S86811A Strain of other muscle(s) and tendon(s) at lower leg level, right leg, initial encounter: Secondary | ICD-10-CM

## 2020-02-23 DIAGNOSIS — M25661 Stiffness of right knee, not elsewhere classified: Secondary | ICD-10-CM

## 2020-02-23 DIAGNOSIS — R6889 Other general symptoms and signs: Secondary | ICD-10-CM | POA: Diagnosis not present

## 2020-02-27 DIAGNOSIS — M6281 Muscle weakness (generalized): Secondary | ICD-10-CM | POA: Diagnosis not present

## 2020-02-28 ENCOUNTER — Ambulatory Visit: Payer: Medicare Other

## 2020-02-28 ENCOUNTER — Other Ambulatory Visit: Payer: Self-pay

## 2020-02-28 DIAGNOSIS — R2689 Other abnormalities of gait and mobility: Secondary | ICD-10-CM | POA: Diagnosis not present

## 2020-02-28 DIAGNOSIS — M6281 Muscle weakness (generalized): Secondary | ICD-10-CM

## 2020-02-28 DIAGNOSIS — S86811A Strain of other muscle(s) and tendon(s) at lower leg level, right leg, initial encounter: Secondary | ICD-10-CM | POA: Diagnosis not present

## 2020-02-28 DIAGNOSIS — S76112A Strain of left quadriceps muscle, fascia and tendon, initial encounter: Secondary | ICD-10-CM | POA: Diagnosis not present

## 2020-02-28 DIAGNOSIS — M25661 Stiffness of right knee, not elsewhere classified: Secondary | ICD-10-CM | POA: Diagnosis not present

## 2020-02-28 DIAGNOSIS — M25662 Stiffness of left knee, not elsewhere classified: Secondary | ICD-10-CM | POA: Diagnosis not present

## 2020-02-28 DIAGNOSIS — R6889 Other general symptoms and signs: Secondary | ICD-10-CM | POA: Diagnosis not present

## 2020-02-28 NOTE — Therapy (Signed)
[place], [place], [address] Phone: [phone]   Fax:  [phone]  Physical Therapy Treatment  Patient Details  Name: Douglas [name] MRN: [id number] Date of Birth: 1970-11-15 Referring Provider (PT): Douglas [name]   Encounter Date: 02/28/2020   PT End of Session - 02/28/20 1701    Visit Number 5    Number of Visits 12    Date for PT Re-Evaluation 03/14/20    Authorization Type [employer] MCR    Authorization Time Period FOTO visit #6    Progress Note Due on Visit 10    PT Start Time 0415    PT Stop Time 0455    PT Time Calculation (min) 40 min    Activity Tolerance Patient tolerated treatment well    Behavior During Therapy [facility] for tasks assessed/performed           Past Medical History:  Diagnosis Date  . CHF (congestive heart failure) ([place])   . Degenerative arthritis   . Depression   . Diabetes mellitus without complication ([place])   . GERD (gastroesophageal reflux disease)   . Gout   . HLD (hyperlipidemia)   . Hypertension   . Seasonal allergies     Past Surgical History:  Procedure Laterality Date  . KNEE ARTHROSCOPY Left 11/01/2019   Procedure: ARTHROSCOPY KNEE AND MENISCUS DEBRIDEMENT;  Surgeon: Douglas Pel, MD;  Location: [place];  Service: Orthopedics;  Laterality: Left;  . KNEE ARTHROSCOPY WITH PATELLAR TENDON REPAIR Right 11/01/2019   Procedure: KNEE ARTHROSCOPY WITH PATELLAR TENDON REPAIR;  Surgeon: Douglas Pel, MD;  Location: [place];  Service: Orthopedics;  Laterality: Right;  . TENDON REPAIR Left 11/01/2019   Procedure: QUADRICEP TENDON REPAIR;  Surgeon: Douglas Pel, MD;  Location: [place];  Service: Orthopedics;  Laterality: Left;    There were no vitals filed for this visit.   Subjective Assessment - 02/28/20 1615    Subjective Doing well. Still not able to extend RT knee fully    Currently in Pain? No/denies              [facility] PT Assessment -  02/28/20 0001      AROM   Right Knee Extension -25    Right Knee Flexion 117    Left Knee Extension -12    Left Knee Flexion 117                         [facility] Adult PT Treatment/Exercise - 02/28/20 0001      Knee/Hip Exercises: Seated   Long Arc Quad Limitations 30 reps RT/LT    Heel Slides Right;Left    Heel Slides Limitations 30 reps , opposite of LAQ       Knee/Hip Exercises: Supine   Short Arc Quad Sets Limitations 60 respps 20 with electric stim    Bridges 20 reps    Other Supine Knee/Hip Exercises bent knee raise x 20 RT/LT      Modalities   Modalities Teacher, English as a foreign language Location RT quad    Chartered certified accountant Russian    Electrical Stimulation Parameters intensity to tolerance    Electrical Stimulation Goals Strength      Manual Therapy   Manual Therapy Passive ROM                    PT Short Term Goals - 02/23/20 1637  PT SHORT TERM GOAL #1   Title Pt and caregiver will be independent with HEP    Baseline He appears to be doing HEP    Status Partially Met      PT SHORT TERM GOAL #2   Title Pt will be able to perform 5x sit<>stand from w/c with min A    Baseline No assist for Pt but significant UE asssit in bars    Status On-going      PT SHORT TERM GOAL #3   Title Pt will be able to perform 5 SLR with no lag    Baseline still a lag RT    Status On-going      PT SHORT TERM GOAL #4   Title Pt will be able to tolerate standing ~30 sec for grooming task    Baseline Needs UE for safety  but able to stand for 5 miin or more    Status On-going             PT Long Term Goals - 02/01/20 1737      PT LONG TERM GOAL #1   Title Pt will be independent with advanced HEP    Time 6    Period Weeks    Status New    Target Date 03/14/20      PT LONG TERM GOAL #2   Title Pt will be able to amb 5' with RW min A to get into his bathroom    Baseline Unable    Time  6    Period Weeks    Status New    Target Date 02/22/20      PT LONG TERM GOAL #3   Title Pt will be able to perform 5x sit<>stand with SBA    Time 6    Period Weeks    Status New    Target Date 03/14/20      PT LONG TERM GOAL #4   Title Pt will have improved FOTO score to 55%    Baseline 79% limitation    Time 6    Period Weeks    Status New    Target Date 03/14/20      PT LONG TERM GOAL #5   Title Pt will be able to perform stand pivot t/fs mod I with RW    Baseline Unable    Time 6    Period Weeks    Status New    Target Date 03/14/20                 Plan - 02/28/20 1702    Clinical Impression Statement Douglas [name] liked the electric stim and wanted to try TENS unit he has at home.  His QS RT is better but not enough to lft RT foot. Will use stim at least everyother visit to facilitate quad. He is still not safe to walk with less than a RW    PT Treatment/Interventions ADLs/Self Care Home Management;Aquatic Therapy;Cryotherapy;Electrical Stimulation;Iontophoresis 28m/ml Dexamethasone;Moist Heat;Ultrasound;Gait training;Stair training;Functional mobility training;Therapeutic activities;Therapeutic exercise;Balance training;Neuromuscular re-education;Patient/family education;Orthotic Fit/Training;Manual techniques;Scar mobilization;Passive range of motion;Taping;Dry needling;Vasopneumatic Device    PT Next Visit Plan Continue knee ROM and strengthening. Work on sSecretary/administrator  electric stim with quad exercises  MOre time on ROM      FOTO    PT Home Exercise Plan Access Code [email or web address] ( QS , SAQ, SLR and heel slides with strap, march)  LAQ    Consulted and Agree with Plan of Care Patient  Patient will benefit from skilled therapeutic intervention in order to improve the following deficits and impairments:  Abnormal gait, Decreased balance, Decreased mobility, Difficulty walking, Hypomobility, Decreased range of motion, Improper body mechanics, Obesity,  Decreased activity tolerance, Decreased strength, Increased fascial restricitons, Impaired flexibility  Visit Diagnosis: Stiffness of left knee, not elsewhere classified  Muscle weakness (generalized)  Stiffness of right knee, not elsewhere classified  Rupture of left quadriceps tendon, initial encounter  Rupture of patellar tendon, right, initial encounter     Problem List Patient Active Problem List   Diagnosis Date Noted  . Hypertension 10/20/2019  . Sleep apnea 10/20/2019  . Atrial fibrillation ([place]) 10/20/2019  . Knee pain 10/20/2019  . Diabetes mellitus without complication ([place])   . CHF (congestive heart failure) ([place])   . Gout   . Rupture of left quadriceps tendon   . Rupture of patellar tendon, right, initial encounter   . ATN (acute tubular necrosis) ([place])     Darrel Hoover  PT 02/28/2020, 5:07 PM  [facility] [address] [place], [place], [address] Phone: [phone]   Fax:  [phone]  Name: Douglas [name] MRN: [id number] Date of Birth: 09/20/1970

## 2020-02-28 NOTE — Therapy (Signed)
[place], [place], [address] Phone: [phone]   Fax:  [phone]  Physical Therapy Treatment  Patient Details  Name: Aldric Wenzler MRN: [id number] Date of Birth: 21-Aug-1970 Referring Provider (PT): Alphonzo Severance   Encounter Date: 02/23/2020  Visit 4 of 12 Start time 68 Stop time 73 Fatigued at end Behavior WNL  Past Medical History:  Diagnosis Date  . CHF (congestive heart failure) ([place])   . Degenerative arthritis   . Depression   . Diabetes mellitus without complication ([place])   . GERD (gastroesophageal reflux disease)   . Gout   . HLD (hyperlipidemia)   . Hypertension   . Seasonal allergies     Past Surgical History:  Procedure Laterality Date  . KNEE ARTHROSCOPY Left 11/01/2019   Procedure: ARTHROSCOPY KNEE AND MENISCUS DEBRIDEMENT;  Surgeon: Meredith Pel, MD;  Location: [place];  Service: Orthopedics;  Laterality: Left;  . KNEE ARTHROSCOPY WITH PATELLAR TENDON REPAIR Right 11/01/2019   Procedure: KNEE ARTHROSCOPY WITH PATELLAR TENDON REPAIR;  Surgeon: Meredith Pel, MD;  Location: [place];  Service: Orthopedics;  Laterality: Right;  . TENDON REPAIR Left 11/01/2019   Procedure: QUADRICEP TENDON REPAIR;  Surgeon: Meredith Pel, MD;  Location: [place];  Service: Orthopedics;  Laterality: Left;    There were no vitals filed for this visit.   S:  He reported improveing with mobility but still need UE support for walking. No pain     Walking : in bars 100 feet sep through pattern significant UE support for RT leg stance.   Standing exercise in bars  Hip flexion /abduction  Knee flexion , heel raises, partial sit downs x 5 -20 reps  Seated LAQ AROM for terminal extension x 50 reps. [place] on QS and [employer]                         PT Short Term Goals - 02/23/20 1637      PT SHORT TERM GOAL #1   Title Pt and caregiver will be independent with HEP     Baseline He appears to be doing HEP    Status Partially Met      PT SHORT TERM GOAL #2   Title Pt will be able to perform 5x sit<>stand from w/c with min A    Baseline No assist for Pt but significant UE asssit in bars    Status On-going      PT SHORT TERM GOAL #3   Title Pt will be able to perform 5 SLR with no lag    Baseline still a lag RT    Status On-going      PT SHORT TERM GOAL #4   Title Pt will be able to tolerate standing ~30 sec for grooming task    Baseline Needs UE for safety  but able to stand for 5 miin or more    Status On-going             PT Long Term Goals - 02/01/20 1737      PT LONG TERM GOAL #1   Title Pt will be independent with advanced HEP    Time 6    Period Weeks    Status New    Target Date 03/14/20      PT LONG TERM GOAL #2   Title Pt will be able to amb 5' with RW min A to get into his bathroom  Baseline Unable    Time 6    Period Weeks    Status New    Target Date 02/22/20      PT LONG TERM GOAL #3   Title Pt will be able to perform 5x sit<>stand with SBA    Time 6    Period Weeks    Status New    Target Date 03/14/20      PT LONG TERM GOAL #4   Title Pt will have improved FOTO score to 55%    Baseline 79% limitation    Time 6    Period Weeks    Status New    Target Date 03/14/20      PT LONG TERM GOAL #5   Title Pt will be able to perform stand pivot t/fs mod I with RW    Baseline Unable    Time 6    Period Weeks    Status New    Target Date 03/14/20          Plan : continue strengthening and use mat next visit for ROM and strength Possible Electric stim Work on strength and ROM next visit.         Patient will benefit from skilled therapeutic intervention in order to improve the following deficits and impairments:  Abnormal gait, Decreased balance, Decreased mobility, Difficulty walking, Hypomobility, Decreased range of motion, Improper body mechanics, Obesity, Decreased activity tolerance, Decreased  strength, Increased fascial restricitons, Impaired flexibility  Visit Diagnosis: Stiffness of left knee, not elsewhere classified  Muscle weakness (generalized)  Stiffness of right knee, not elsewhere classified  Other abnormalities of gait and mobility  Rupture of left quadriceps tendon, initial encounter  Rupture of patellar tendon, right, initial encounter     Problem List Patient Active Problem List   Diagnosis Date Noted  . Hypertension 10/20/2019  . Sleep apnea 10/20/2019  . Atrial fibrillation ([place]) 10/20/2019  . Knee pain 10/20/2019  . Diabetes mellitus without complication ([place])   . CHF (congestive heart failure) ([place])   . Gout   . Rupture of left quadriceps tendon   . Rupture of patellar tendon, right, initial encounter   . ATN (acute tubular necrosis) ([place])     Darrel Hoover  PT 02/28/2020, 2:19 PM  [facility] [address] [place], [place], [address] Phone: [phone]   Fax:  [phone]  Name: Cheyenne Schumm MRN: [id number] Date of Birth: 1971-05-22

## 2020-03-01 ENCOUNTER — Ambulatory Visit: Payer: Medicare Other

## 2020-03-01 ENCOUNTER — Other Ambulatory Visit: Payer: Self-pay

## 2020-03-01 DIAGNOSIS — M6281 Muscle weakness (generalized): Secondary | ICD-10-CM

## 2020-03-01 DIAGNOSIS — R6889 Other general symptoms and signs: Secondary | ICD-10-CM | POA: Diagnosis not present

## 2020-03-01 DIAGNOSIS — M25661 Stiffness of right knee, not elsewhere classified: Secondary | ICD-10-CM | POA: Diagnosis not present

## 2020-03-01 DIAGNOSIS — S76112A Strain of left quadriceps muscle, fascia and tendon, initial encounter: Secondary | ICD-10-CM | POA: Diagnosis not present

## 2020-03-01 DIAGNOSIS — R2689 Other abnormalities of gait and mobility: Secondary | ICD-10-CM

## 2020-03-01 DIAGNOSIS — M25662 Stiffness of left knee, not elsewhere classified: Secondary | ICD-10-CM | POA: Diagnosis not present

## 2020-03-01 DIAGNOSIS — S86811A Strain of other muscle(s) and tendon(s) at lower leg level, right leg, initial encounter: Secondary | ICD-10-CM | POA: Diagnosis not present

## 2020-03-01 NOTE — Therapy (Signed)
[place], [place], [address] Phone: [phone]   Fax:  [phone]  Physical Therapy Treatment  Patient Details  Name: Douglas [name] MRN: [id number] Date of Birth: [birth year] Referring Provider (PT): Alphonzo Severance   Encounter Date: 03/01/2020   PT End of Session - 03/01/20 1623    Visit Number 6    Number of Visits 12    Date for PT Re-Evaluation 03/14/20    Authorization Type UHC MCR    Authorization Time Period FOTO visit #12    PT Start Time 0432    PT Stop Time 0505    PT Time Calculation (min) 33 min    Activity Tolerance Patient tolerated treatment well    Behavior During Therapy [facility] for tasks assessed/performed           Past Medical History:  Diagnosis Date  . CHF (congestive heart failure) ([place])   . Degenerative arthritis   . Depression   . Diabetes mellitus without complication ([place])   . GERD (gastroesophageal reflux disease)   . Gout   . HLD (hyperlipidemia)   . Hypertension   . Seasonal allergies     Past Surgical History:  Procedure Laterality Date  . KNEE ARTHROSCOPY Left 11/01/2019   Procedure: ARTHROSCOPY KNEE AND MENISCUS DEBRIDEMENT;  Surgeon: Meredith Pel, MD;  Location: [place];  Service: Orthopedics;  Laterality: Left;  . KNEE ARTHROSCOPY WITH PATELLAR TENDON REPAIR Right 11/01/2019   Procedure: KNEE ARTHROSCOPY WITH PATELLAR TENDON REPAIR;  Surgeon: Meredith Pel, MD;  Location: [place];  Service: Orthopedics;  Laterality: Right;  . TENDON REPAIR Left 11/01/2019   Procedure: QUADRICEP TENDON REPAIR;  Surgeon: Meredith Pel, MD;  Location: [place];  Service: Orthopedics;  Laterality: Left;    There were no vitals filed for this visit.   Subjective Assessment - 03/01/20 1717    Subjective Walked in [place]  with spouse. Unable to tell me how far. Walked with walker. Encouraged to  measure distance or time  so he can push himself to incr wallking.     Currently in Pain? No/denies                             [facility] Adult PT Treatment/Exercise - 03/01/20 0001      Knee/Hip Exercises: Aerobic   Nustep L5 LE only   10 min            Significant UE assist with bars. in bars 220 feet  CGA of PT. RT knee buckled once. Standing balance without UE support and with hip hinge reaching in bars  And pulling self erect with hipsx20          PT Short Term Goals - 03/01/20 1713      PT SHORT TERM GOAL #1   Title Pt and caregiver will be independent with HEP    Baseline He appears to be doing HEP and walking in [place]    Status Achieved      PT SHORT TERM GOAL #2   Title Pt will be able to perform 5x sit<>stand from w/c with min A    Baseline No assist for Pt but significant UE asssit in bars with the bars    Status On-going      PT SHORT TERM GOAL #3   Title Pt will be able to perform 5 SLR with no lag    Baseline still a  lag RT    Status On-going      PT SHORT TERM GOAL #4   Title Pt will be able to tolerate standing ~30 sec for grooming task    Baseline Needs UE for safety  but able to stand for 5 miin or more    Status Partially Met             PT Long Term Goals - 02/01/20 1737      PT LONG TERM GOAL #1   Title Pt will be independent with advanced HEP    Time 6    Period Weeks    Status New    Target Date 03/14/20      PT LONG TERM GOAL #2   Title Pt will be able to amb 5' with RW min A to get into his bathroom    Baseline Unable    Time 6    Period Weeks    Status New    Target Date 02/22/20      PT LONG TERM GOAL #3   Title Pt will be able to perform 5x sit<>stand with SBA    Time 6    Period Weeks    Status New    Target Date 03/14/20      PT LONG TERM GOAL #4   Title Pt will have improved FOTO score to 55%    Baseline 79% limitation    Time 6    Period Weeks    Status New    Target Date 03/14/20      PT LONG TERM GOAL #5   Title Pt will be able to perform stand pivot t/fs  mod I with RW    Baseline Unable    Time 6    Period Weeks    Status New    Target Date 03/14/20                 Plan - 03/01/20 1700    Clinical Impression Statement Pt late so did long walk (220 feet) and nustep 10 min LE. FOTO score reviwed with pt. as he scored 63% exceeding expectation by 18 points.    PT Treatment/Interventions ADLs/Self Care Home Management;Aquatic Therapy;Cryotherapy;Electrical Stimulation;Iontophoresis 56m/ml Dexamethasone;Moist Heat;Ultrasound;Gait training;Stair training;Functional mobility training;Therapeutic activities;Therapeutic exercise;Balance training;Neuromuscular re-education;Patient/family education;Orthotic Fit/Training;Manual techniques;Scar mobilization;Passive range of motion;Taping;Dry needling;Vasopneumatic Device    PT Next Visit Plan Continue knee ROM and strengthening. Work on sSecretary/administrator  electric stim with quad exercises  MOre time on ROM, Nustep    PT Home Exercise Plan Access Code [email or web address] ( QS , SAQ, SLR and heel slides with strap, march)  LAQ    Consulted and Agree with Plan of Care Patient           Patient will benefit from skilled therapeutic intervention in order to improve the following deficits and impairments:  Abnormal gait, Decreased balance, Decreased mobility, Difficulty walking, Hypomobility, Decreased range of motion, Improper body mechanics, Obesity, Decreased activity tolerance, Decreased strength, Increased fascial restricitons, Impaired flexibility  Visit Diagnosis: Stiffness of left knee, not elsewhere classified  Muscle weakness (generalized)  Stiffness of right knee, not elsewhere classified  Rupture of left quadriceps tendon, initial encounter  Other abnormalities of gait and mobility     Problem List Patient Active Problem List   Diagnosis Date Noted  . Hypertension 10/20/2019  . Sleep apnea 10/20/2019  . Atrial fibrillation ([place] 10/20/2019  . Knee pain 10/20/2019  . Diabetes  mellitus without complication ([place]   . CHF (  congestive heart failure) ([place])   . Gout   . Rupture of left quadriceps tendon   . Rupture of patellar tendon, right, initial encounter   . ATN (acute tubular necrosis) ([place])     Darrel Hoover  PT 03/01/2020, 5:22 PM  [facility] [address] [place], [place], [address] Phone: [phone]   Fax:  [phone]  Name: Douglas [name] MRN: [id number] Date of Birth: 1970/12/17

## 2020-03-02 ENCOUNTER — Ambulatory Visit (INDEPENDENT_AMBULATORY_CARE_PROVIDER_SITE_OTHER): Payer: Medicare Other | Admitting: Surgical

## 2020-03-02 DIAGNOSIS — S76112A Strain of left quadriceps muscle, fascia and tendon, initial encounter: Secondary | ICD-10-CM

## 2020-03-02 DIAGNOSIS — R6889 Other general symptoms and signs: Secondary | ICD-10-CM | POA: Diagnosis not present

## 2020-03-02 DIAGNOSIS — S86811A Strain of other muscle(s) and tendon(s) at lower leg level, right leg, initial encounter: Secondary | ICD-10-CM | POA: Diagnosis not present

## 2020-03-02 MED ORDER — TRAMADOL HCL 50 MG PO TABS: 50.0000 mg | ORAL_TABLET | Freq: Every day | ORAL | 0 refills | Status: AC | PRN

## 2020-03-06 ENCOUNTER — Ambulatory Visit: Payer: Medicare Other

## 2020-03-07 ENCOUNTER — Telehealth: Payer: Self-pay

## 2020-03-07 NOTE — Telephone Encounter (Signed)
Patient called in wanting to talk about prescriptions  And for occupational therapy.

## 2020-03-08 ENCOUNTER — Ambulatory Visit: Payer: Medicare Other

## 2020-03-08 ENCOUNTER — Other Ambulatory Visit: Payer: Self-pay

## 2020-03-08 DIAGNOSIS — S76112A Strain of left quadriceps muscle, fascia and tendon, initial encounter: Secondary | ICD-10-CM

## 2020-03-08 DIAGNOSIS — M25661 Stiffness of right knee, not elsewhere classified: Secondary | ICD-10-CM

## 2020-03-08 DIAGNOSIS — M25662 Stiffness of left knee, not elsewhere classified: Secondary | ICD-10-CM | POA: Diagnosis not present

## 2020-03-08 DIAGNOSIS — S86811A Strain of other muscle(s) and tendon(s) at lower leg level, right leg, initial encounter: Secondary | ICD-10-CM

## 2020-03-08 DIAGNOSIS — M6281 Muscle weakness (generalized): Secondary | ICD-10-CM

## 2020-03-08 DIAGNOSIS — R6889 Other general symptoms and signs: Secondary | ICD-10-CM | POA: Diagnosis not present

## 2020-03-08 DIAGNOSIS — R2689 Other abnormalities of gait and mobility: Secondary | ICD-10-CM

## 2020-03-08 NOTE — Telephone Encounter (Signed)
I called patient. He was asking about referral for HHOT I have messaged Dorene Sorrow to ask him about the status of this.

## 2020-03-08 NOTE — Therapy (Signed)
[place], [place], [address] Phone: [phone]   Fax:  [phone]  Physical Therapy Treatment  Patient Details  Name: Jaeden Messer MRN: [id number] Date of Birth: [birth year] Referring Provider (PT): Alphonzo Severance   Encounter Date: 03/08/2020   PT End of Session - 03/08/20 1609    Visit Number 7    Number of Visits 28    Date for PT Re-Evaluation 05/18/20    Authorization Type UHC MCR    Authorization Time Period FOTO visit #12    Progress Note Due on Visit 10    PT Start Time 0400    PT Stop Time 0450    PT Time Calculation (min) 50 min    Activity Tolerance Patient tolerated treatment well    Behavior During Therapy [facility] for tasks assessed/performed           Past Medical History:  Diagnosis Date  . CHF (congestive heart failure) ([place])   . Degenerative arthritis   . Depression   . Diabetes mellitus without complication ([place])   . GERD (gastroesophageal reflux disease)   . Gout   . HLD (hyperlipidemia)   . Hypertension   . Seasonal allergies     Past Surgical History:  Procedure Laterality Date  . KNEE ARTHROSCOPY Left 11/01/2019   Procedure: ARTHROSCOPY KNEE AND MENISCUS DEBRIDEMENT;  Surgeon: Meredith Pel, MD;  Location: [place];  Service: Orthopedics;  Laterality: Left;  . KNEE ARTHROSCOPY WITH PATELLAR TENDON REPAIR Right 11/01/2019   Procedure: KNEE ARTHROSCOPY WITH PATELLAR TENDON REPAIR;  Surgeon: Meredith Pel, MD;  Location: [place];  Service: Orthopedics;  Laterality: Right;  . TENDON REPAIR Left 11/01/2019   Procedure: QUADRICEP TENDON REPAIR;  Surgeon: Meredith Pel, MD;  Location: [place];  Service: Orthopedics;  Laterality: Left;    There were no vitals filed for this visit.   Subjective Assessment - 03/08/20 1632    Subjective He stated he has OT ordered.  He saw PA. He said doing well overall . continue to wear brace on RT. Said RT leg may not be able to  fully extend due to damage on RT knee.  He said to continue walking    Currently in Pain? No/denies                             [facility] Adult PT Treatment/Exercise - 03/08/20 0001      Knee/Hip Exercises: Aerobic   Nustep L5 LE only   10 min      Knee/Hip Exercises: Seated   Long Arc Quad Weight 3 lbs.    Long [employer] Limitations 30 reps RT/LT  seated  and supine with hip 90 degrees x 20 no weight      Knee/Hip Exercises: Supine   Bridges Limitations 25 reso    Other Supine Knee/Hip Exercises bent knee raise x 20 RT/LT      Knee/Hip Exercises: Sidelying   Hip ABduction Right;Left;15 reps    Hip ABduction Limitations cued to keep lknee straight and and also worked to abduct with hip extension , also worked on knee extension but not really able to extend fully even in this position      Knee/Hip Exercises: Prone   Hamstring Curl 15 reps    Hamstring Curl Limitations RT/LT     Straight Leg Raises Right;Left;15 reps    Straight Leg Raises Limitations then x 15 RT/Lt abducting  leg also.                   PT Education - 03/08/20 1706    Education Details Need to contact insurer about TENS( probable muscle stim)  unit  to see if they will pay for this    Person(s) Educated Patient    Methods Explanation    Comprehension Verbalized understanding            PT Short Term Goals - 03/01/20 1713      PT SHORT TERM GOAL #1   Title Pt and caregiver will be independent with HEP    Baseline He appears to be doing HEP and walking in [place]    Status Achieved      PT SHORT TERM GOAL #2   Title Pt will be able to perform 5x sit<>stand from w/c with min A    Baseline No assist for Pt but significant UE asssit in bars with the bars    Status On-going      PT SHORT TERM GOAL #3   Title Pt will be able to perform 5 SLR with no lag    Baseline still a lag RT    Status On-going      PT SHORT TERM GOAL #4   Title Pt will be able to tolerate standing ~30 sec  for grooming task    Baseline Needs UE for safety  but able to stand for 5 miin or more    Status Partially Met             PT Long Term Goals - 02/01/20 1737      PT LONG TERM GOAL #1   Title Pt will be independent with advanced HEP    Time 6    Period Weeks    Status New    Target Date 03/14/20      PT LONG TERM GOAL #2   Title Pt will be able to amb 5' with RW min A to get into his bathroom    Baseline Unable    Time 6    Period Weeks    Status New    Target Date 02/22/20      PT LONG TERM GOAL #3   Title Pt will be able to perform 5x sit<>stand with SBA    Time 6    Period Weeks    Status New    Target Date 03/14/20      PT LONG TERM GOAL #4   Title Pt will have improved FOTO score to 55%    Baseline 79% limitation    Time 6    Period Weeks    Status New    Target Date 03/14/20      PT LONG TERM GOAL #5   Title Pt will be able to perform stand pivot t/fs mod I with RW    Baseline Unable    Time 6    Period Weeks    Status New    Target Date 03/14/20                 Plan - 03/08/20 1609    Clinical Impression Statement he was not able to extend RT knee fully even when in gravity eliminated position.  Worked on hip and quad strength on mat today. May try to walk with walker  next week to see how he is doing with this. Will try sit to stand from high mat.  not encouraging that the  PA felt he would not get full RT knee extension.    PT Treatment/Interventions ADLs/Self Care Home Management;Aquatic Therapy;Cryotherapy;Electrical Stimulation;Iontophoresis 29m/ml Dexamethasone;Moist Heat;Ultrasound;Gait training;Stair training;Functional mobility training;Therapeutic activities;Therapeutic exercise;Balance training;Neuromuscular re-education;Patient/family education;Orthotic Fit/Training;Manual techniques;Scar mobilization;Passive range of motion;Taping;Dry needling;Vasopneumatic Device    PT Next Visit Plan Continue knee ROM and strengthening. Work on  sSecretary/administrator  electric stim with quad exercises  MOre time on ROM, Nustep    PT Home Exercise Plan Access Code [email or web address] ( QS , SAQ, SLR and heel slides with strap, march)  LAQ    Consulted and Agree with Plan of Care Patient           Patient will benefit from skilled therapeutic intervention in order to improve the following deficits and impairments:  Abnormal gait, Decreased balance, Decreased mobility, Difficulty walking, Hypomobility, Decreased range of motion, Improper body mechanics, Obesity, Decreased activity tolerance, Decreased strength, Increased fascial restricitons, Impaired flexibility  Visit Diagnosis: Stiffness of left knee, not elsewhere classified  Muscle weakness (generalized)  Stiffness of right knee, not elsewhere classified  Rupture of left quadriceps tendon, initial encounter  Other abnormalities of gait and mobility  Rupture of patellar tendon, right, initial encounter     Problem List Patient Active Problem List   Diagnosis Date Noted  . Hypertension 10/20/2019  . Sleep apnea 10/20/2019  . Atrial fibrillation ([facility] 10/20/2019  . Knee pain 10/20/2019  . Diabetes mellitus without complication ([place]   . CHF (congestive heart failure) ([place]   . Gout   . Rupture of left quadriceps tendon   . Rupture of patellar tendon, right, initial encounter   . ATN (acute tubular necrosis) ([facility]     CDarrel Hoover PT 03/08/2020, 5:13 PM  [facility] [address]Phone: [phone]  Fax:  [phone] Name: AHaley RozaMRN: [id number] of Birth: [birth year]

## 2020-03-10 DIAGNOSIS — M6281 Muscle weakness (generalized): Secondary | ICD-10-CM | POA: Diagnosis not present

## 2020-03-12 ENCOUNTER — Encounter: Payer: Self-pay | Admitting: Surgical

## 2020-03-12 DIAGNOSIS — Z79899 Other long term (current) drug therapy: Secondary | ICD-10-CM | POA: Diagnosis not present

## 2020-03-12 DIAGNOSIS — R7303 Prediabetes: Secondary | ICD-10-CM | POA: Diagnosis not present

## 2020-03-12 DIAGNOSIS — E785 Hyperlipidemia, unspecified: Secondary | ICD-10-CM | POA: Diagnosis not present

## 2020-03-12 DIAGNOSIS — M1990 Unspecified osteoarthritis, unspecified site: Secondary | ICD-10-CM | POA: Diagnosis not present

## 2020-03-12 DIAGNOSIS — I4891 Unspecified atrial fibrillation: Secondary | ICD-10-CM | POA: Diagnosis not present

## 2020-03-12 DIAGNOSIS — M109 Gout, unspecified: Secondary | ICD-10-CM | POA: Diagnosis not present

## 2020-03-12 DIAGNOSIS — R000 Tachycardia, unspecified: Secondary | ICD-10-CM | POA: Diagnosis not present

## 2020-03-12 DIAGNOSIS — I10 Essential (primary) hypertension: Secondary | ICD-10-CM | POA: Diagnosis not present

## 2020-03-12 DIAGNOSIS — R6889 Other general symptoms and signs: Secondary | ICD-10-CM | POA: Diagnosis not present

## 2020-03-12 NOTE — Telephone Encounter (Signed)
I primarily wanted the OT to evaluate him standing up from a seated position on the toilet and show him how to shower with shower chair. Can an OT go work with him for one session only? If not, I get it and just let me know and I'll call him to see how he feels about just going back to using a toilet with assistance from his wife. I know he wants to return to using a toilet normally without depending on diapers, etc.  Maybe jsut needs high toilet seat

## 2020-03-12 NOTE — Telephone Encounter (Signed)
Douglas [name] said that they are unable to do any of this due to patient being treated already in out patient setting.

## 2020-03-12 NOTE — Telephone Encounter (Signed)
Got it, no worries then

## 2020-03-12 NOTE — Telephone Encounter (Signed)
I talked with Douglas [name] about this. He said that patient is currently attending therapy in the outpatient clinic 2 times a week and HHOT cannot go out if he is in outpatient setting already. Please advise. Thanks.

## 2020-03-12 NOTE — Progress Notes (Signed)
Office Visit Note   Patient: Douglas [name]           Date of Birth: 02/04/1971           MRN: [id number] Visit Date: 03/02/2020 Requested by: Douglas Deiters, MD [address] [place],  [place] [address] PCP: Douglas Deiters, MD  Subjective: Chief Complaint  Patient presents with  . Post-op Follow-up    HPI: Douglas [name] is a 49 y.o. male who presents to the office s/p left knee quadricep tendon repair and right knee patellar tendon repair on 11/01/2019.  Patient notes that he is doing well overall.  He denies any falls or injuries since last office visit.  He is currently ambulating with a walker.  He is using a Bledsoe brace on his right leg with no brace on the left leg.  The left knee has 0 degrees of passive extension with 120 degrees of passive flexion.  No calf tenderness and a negative Homans' sign.  He has excellent strength and is able to perform multiple straight leg raises of the left leg without any extensor lag.  Incision is healed well.  Regarding the right knee, he has 0 degrees of passive extension and 120 degrees of passive flexion without any calf tenderness and a negative Homans' sign on the side.  There is an extensor lag of approximately 25 degrees when performing straight leg raise.  There is also a moderate defect noted in the patellar tendon.  Recommend he continue to use the Bledsoe brace on this right leg due to the extensor lag.  Prescribed tramadol.  Plan to continue outpatient physical therapy which she has been going to on [address].  He wants to use a toilet instead of using briefs.  Plan on having home occupational therapist evaluate him and help him with getting back to showering and using toilet again.  Follow-up in 6 weeks with Dr. [place] [name] for clinical recheck.              ROS: All systems reviewed are negative as they relate to the chief complaint within the history of present illness.  Patient denies fevers or chills.  Assessment &  Plan: Visit Diagnoses:  1. Rupture of left quadriceps tendon, initial encounter   2. Rupture of patellar tendon, right, initial encounter     Plan: See above.  Follow-Up Instructions: No follow-ups on file.   Orders:  Orders Placed This Encounter  Procedures  . Ambulatory referral to Physical Therapy   Meds ordered this encounter  Medications  . traMADol (ULTRAM) 50 MG tablet    Sig: Take 1 tablet (50 mg total) by mouth daily as needed.    Dispense:  30 tablet    Refill:  0      Procedures: No procedures performed   Clinical Data: No additional findings.  Objective: Vital Signs: There were no vitals taken for this visit.  Physical Exam:  Constitutional: Patient appears well-developed HEENT:  Head: Normocephalic Eyes:EOM are normal Neck: Normal range of motion Cardiovascular: Normal rate Pulmonary/chest: Effort normal Neurologic: Patient is alert Skin: Skin is warm Psychiatric: Patient has normal mood and affect  Ortho Exam: See above.  Specialty Comments:  No specialty comments available.  Imaging: No results found.   PMFS History: Patient Active Problem List   Diagnosis Date Noted  . Hypertension 10/20/2019  . Sleep apnea 10/20/2019  . Atrial fibrillation (H[facility]) 10/20/2019  . Knee pain 10/20/2019  . Diabetes mellitus without complication (  H[facility])   . CHF (congestive heart failure) (H[facility])   . Gout   . Rupture of left quadriceps tendon   . Rupture of patellar tendon, right, initial encounter   . ATN (acute tubular necrosis) (H[facility])    Past Medical History:  Diagnosis Date  . CHF (congestive heart failure) (H[facility])   . Degenerative arthritis   . Depression   . Diabetes mellitus without complication (H[facility])   . GERD (gastroesophageal reflux disease)   . Gout   . HLD (hyperlipidemia)   . Hypertension   . Seasonal allergies     No family history on file.  Past Surgical History:  Procedure Laterality Date  . KNEE ARTHROSCOPY Left 11/01/2019    Procedure: ARTHROSCOPY KNEE AND MENISCUS DEBRIDEMENT;  Surgeon: Cammy Copa, MD;  Location: [facility] OR;  Service: Orthopedics;  Laterality: Left;  . KNEE ARTHROSCOPY WITH PATELLAR TENDON REPAIR Right 11/01/2019   Procedure: KNEE ARTHROSCOPY WITH PATELLAR TENDON REPAIR;  Surgeon: Cammy Copa, MD;  [facility];  Service: Orthopedics;  Laterality: Right;  . TENDON REPAIR Left 11/01/2019   Procedure: QUADRICEP TENDON REPAIR;  Surgeon: Cammy Copa, MD;  Location: [facility] OR;  Service: Orthopedics;  Laterality: Left;   Social History   Occupational History  . Occupation: disabled  Tobacco Use  . Smoking status: Former Smoker    Quit date: 06/16/2014    Years since quitting: 5.7  . Smokeless tobacco: Never Used  Vaping Use  . Vaping Use: Never used  Substance and Sexual Activity  . Alcohol use: Not Currently  . Drug use: Never  . Sexual activity: Not Currently

## 2020-03-13 ENCOUNTER — Ambulatory Visit: Payer: Medicare Other

## 2020-03-13 ENCOUNTER — Other Ambulatory Visit: Payer: Self-pay

## 2020-03-13 DIAGNOSIS — S76112A Strain of left quadriceps muscle, fascia and tendon, initial encounter: Secondary | ICD-10-CM

## 2020-03-13 DIAGNOSIS — R6889 Other general symptoms and signs: Secondary | ICD-10-CM | POA: Diagnosis not present

## 2020-03-13 DIAGNOSIS — R2689 Other abnormalities of gait and mobility: Secondary | ICD-10-CM

## 2020-03-13 DIAGNOSIS — M25662 Stiffness of left knee, not elsewhere classified: Secondary | ICD-10-CM

## 2020-03-13 DIAGNOSIS — M25661 Stiffness of right knee, not elsewhere classified: Secondary | ICD-10-CM

## 2020-03-13 DIAGNOSIS — S86811A Strain of other muscle(s) and tendon(s) at lower leg level, right leg, initial encounter: Secondary | ICD-10-CM

## 2020-03-13 DIAGNOSIS — M6281 Muscle weakness (generalized): Secondary | ICD-10-CM

## 2020-03-13 DIAGNOSIS — Z7689 Persons encountering health services in other specified circumstances: Secondary | ICD-10-CM | POA: Diagnosis not present

## 2020-03-13 NOTE — Telephone Encounter (Signed)
Sending back to you as reminder to call patient.

## 2020-03-13 NOTE — Therapy (Signed)
[name], [place], [address] Phone: [phone]   Fax:  [phone]  Physical Therapy Treatment  Patient Details  Name: Douglas [name] MRN: [id number] Date of Birth: 11/28/1970 Referring Provider (PT): Alphonzo Severance   Encounter Date: 03/13/2020   PT End of Session - 03/13/20 1555    Visit Number 8    Number of Visits 28    Date for PT Re-Evaluation 05/18/20    Authorization Type [employer] MCR    Authorization Time Period FOTO visit #12    PT Start Time 0400    PT Stop Time 0450    PT Time Calculation (min) 50 min    Equipment Utilized During Treatment Gait belt    Activity Tolerance Patient tolerated treatment well    Behavior During Therapy [facility] for tasks assessed/performed           Past Medical History:  Diagnosis Date  . CHF (congestive heart failure) (Post)   . Degenerative arthritis   . Depression   . Diabetes mellitus without complication ([place])   . GERD (gastroesophageal reflux disease)   . Gout   . HLD (hyperlipidemia)   . Hypertension   . Seasonal allergies     Past Surgical History:  Procedure Laterality Date  . KNEE ARTHROSCOPY Left 11/01/2019   Procedure: ARTHROSCOPY KNEE AND MENISCUS DEBRIDEMENT;  Surgeon: Meredith Pel, MD;  Location: [place];  Service: Orthopedics;  Laterality: Left;  . KNEE ARTHROSCOPY WITH PATELLAR TENDON REPAIR Right 11/01/2019   Procedure: KNEE ARTHROSCOPY WITH PATELLAR TENDON REPAIR;  Surgeon: Meredith Pel, MD;  Location: [place];  Service: Orthopedics;  Laterality: Right;  . TENDON REPAIR Left 11/01/2019   Procedure: QUADRICEP TENDON REPAIR;  Surgeon: Meredith Pel, MD;  Location: [place];  Service: Orthopedics;  Laterality: Left;    There were no vitals filed for this visit.   Subjective Assessment - 03/13/20 1613    Subjective No pain . doing about the same    Currently in Pain? No/denies                              [facility] Adult PT Treatment/Exercise - 03/13/20 0001      Ambulation/Gait   Ambulation Distance (Feet) 50 Feet    Assistive device Rolling walker    Gait Pattern Step-through pattern    Ambulation Surface Level;Indoor    Gait Comments PT CGA      Knee/Hip Exercises: Aerobic   Nustep L5 LE only   10 min      Knee/Hip Exercises: Seated   Long Arc Quad Weight 5 lbs.    Long [employer] Limitations 30 reps RT/LT  seated      Sit to [employer] 10 reps;with UE support   30 ' high mat cued to pull hips in , then 2x10 no UE        Stand balance with sit to stand.            PT Short Term Goals - 03/01/20 1713      PT SHORT TERM GOAL #1   Title Pt and caregiver will be independent with HEP    Baseline He appears to be doing HEP and walking in carport    Status Achieved      PT SHORT TERM GOAL #2   Title Pt will be able to perform 5x sit<>stand from w/c with min A  Baseline No assist for Pt but significant UE asssit in bars with the bars    Status On-going      PT SHORT TERM GOAL #3   Title Pt will be able to perform 5 SLR with no lag    Baseline still a lag RT    Status On-going      PT SHORT TERM GOAL #4   Title Pt will be able to tolerate standing ~30 sec for grooming task    Baseline Needs UE for safety  but able to stand for 5 miin or more    Status Partially Met             PT Long Term Goals - 02/01/20 1737      PT LONG TERM GOAL #1   Title Pt will be independent with advanced HEP    Time 6    Period Weeks    Status New    Target Date 03/14/20      PT LONG TERM GOAL #2   Title Pt will be able to amb 5' with RW min A to get into his bathroom    Baseline Unable    Time 6    Period Weeks    Status New    Target Date 02/22/20      PT LONG TERM GOAL #3   Title Pt will be able to perform 5x sit<>stand with SBA    Time 6    Period Weeks    Status New    Target Date 03/14/20      PT LONG TERM GOAL #4   Title Pt will have improved FOTO score to 55%     Baseline 79% limitation    Time 6    Period Weeks    Status New    Target Date 03/14/20      PT LONG TERM GOAL #5   Title Pt will be able to perform stand pivot t/fs mod I with RW    Baseline Unable    Time 6    Period Weeks    Status New    Target Date 03/14/20                 Plan - 03/13/20 1556    Clinical Impression Statement Worked on standing from high surface and hoope to progress to lower surfaces  as strength improves. He was able to sustain contraction when working on RT terminaknee extension  and pull more muscle  when asked to kick harder near full ROm.  He walked safely in the gym without the brace but need to CG when up with or without brace.    PT Treatment/Interventions ADLs/Self Care Home Management;Aquatic Therapy;Cryotherapy;Electrical Stimulation;Iontophoresis 27m/ml Dexamethasone;Moist Heat;Ultrasound;Gait training;Stair training;Functional mobility training;Therapeutic activities;Therapeutic exercise;Balance training;Neuromuscular re-education;Patient/family education;Orthotic Fit/Training;Manual techniques;Scar mobilization;Passive range of motion;Taping;Dry needling;Vasopneumatic Device    PT Next Visit Plan Continue knee ROM and strengthening. Work on standing tolerance/gait./ sitr to stand from high surface.  electric stim with quad exercises  MOre time on ROM,    PT Home Exercise Plan Access Code [email or web address] ( QS , SAQ, SLR and heel slides with strap, march)  LAQ    Consulted and Agree with Plan of Care Patient           Patient will benefit from skilled therapeutic intervention in order to improve the following deficits and impairments:  Abnormal gait, Decreased balance, Decreased mobility, Difficulty walking, Hypomobility, Decreased range of motion, Improper body mechanics, Obesity, Decreased activity tolerance, Decreased  strength, Increased fascial restricitons, Impaired flexibility  Visit Diagnosis: Stiffness of left knee, not elsewhere  classified  Muscle weakness (generalized)  Stiffness of right knee, not elsewhere classified  Rupture of left quadriceps tendon, initial encounter  Rupture of patellar tendon, right, initial encounter  Other abnormalities of gait and mobility     Problem List Patient Active Problem List   Diagnosis Date Noted  . Hypertension 10/20/2019  . Sleep apnea 10/20/2019  . Atrial fibrillation ([place]) 10/20/2019  . Knee pain 10/20/2019  . Diabetes mellitus without complication ([place])   . CHF (congestive heart failure) ([place])   . Gout   . Rupture of left quadriceps tendon   . Rupture of patellar tendon, right, initial encounter   . ATN (acute tubular necrosis) [facility])     Darrel Hoover  PT 03/13/2020, 5:07 PM  [facility] [address] [place], [place], [address] Phone: [phone]   Fax:  [phone]  Name: Sathvik Tiedt MRN: [id number] Date of Birth: Oct 28, 1970

## 2020-03-14 NOTE — Telephone Encounter (Signed)
Called. He is okay with no home therapist coming out. He will go back to using bathroom normally and states his current bathroom is handicap accessible so it should be manageable. He will also have his wife help him when he needs to stand up from toilet if there is any difficulty with that.  Follow-up with Dr. August Saucer in ~4 weeks.

## 2020-03-15 ENCOUNTER — Other Ambulatory Visit: Payer: Self-pay

## 2020-03-15 ENCOUNTER — Ambulatory Visit: Payer: Medicare Other

## 2020-03-15 DIAGNOSIS — M6281 Muscle weakness (generalized): Secondary | ICD-10-CM | POA: Diagnosis not present

## 2020-03-15 DIAGNOSIS — S76112A Strain of left quadriceps muscle, fascia and tendon, initial encounter: Secondary | ICD-10-CM | POA: Diagnosis not present

## 2020-03-15 DIAGNOSIS — M25662 Stiffness of left knee, not elsewhere classified: Secondary | ICD-10-CM | POA: Diagnosis not present

## 2020-03-15 DIAGNOSIS — M25661 Stiffness of right knee, not elsewhere classified: Secondary | ICD-10-CM | POA: Diagnosis not present

## 2020-03-15 DIAGNOSIS — Z7689 Persons encountering health services in other specified circumstances: Secondary | ICD-10-CM | POA: Diagnosis not present

## 2020-03-15 DIAGNOSIS — R2689 Other abnormalities of gait and mobility: Secondary | ICD-10-CM

## 2020-03-15 DIAGNOSIS — S86811A Strain of other muscle(s) and tendon(s) at lower leg level, right leg, initial encounter: Secondary | ICD-10-CM | POA: Diagnosis not present

## 2020-03-15 NOTE — Therapy (Signed)
[facility] [address] [place], [place], [address] Phone: [phone]   Fax:  [phone]  Physical Therapy Treatment  Patient Details  Name: Douglas [name] MRN: [id number] Date of Birth: [birth year] Referring Provider (PT): Dorene Grebe   Encounter Date: 03/15/2020   PT End of Session - 03/15/20 1627    Visit Number 9    Number of Visits 28    Date for PT Re-Evaluation 05/18/20    Authorization Time Period FOTO visit #12    PT Start Time 0415    PT Stop Time 0500    PT Time Calculation (min) 45 min    Activity Tolerance Patient tolerated treatment well    Behavior During Therapy [facility] for tasks assessed/performed           Past Medical History:  Diagnosis Date  . CHF (congestive heart failure) (HCC)   . Degenerative arthritis   . Depression   . Diabetes mellitus without complication (HCC)   . GERD (gastroesophageal reflux disease)   . Gout   . HLD (hyperlipidemia)   . Hypertension   . Seasonal allergies     Past Surgical History:  Procedure Laterality Date  . KNEE ARTHROSCOPY Left 11/01/2019   Procedure: ARTHROSCOPY KNEE AND MENISCUS DEBRIDEMENT;  Surgeon: Cammy Copa, MD;  Location: [facility] OR;  Service: Orthopedics;  Laterality: Left;  . KNEE ARTHROSCOPY WITH PATELLAR TENDON REPAIR Right 11/01/2019   Procedure: KNEE ARTHROSCOPY WITH PATELLAR TENDON REPAIR;  Surgeon: Cammy Copa, MD;  Location: [facility] OR;  Service: Orthopedics;  Laterality: Right;  . TENDON REPAIR Left 11/01/2019   Procedure: QUADRICEP TENDON REPAIR;  Surgeon: Cammy Copa, MD;  Location: [facility] OR;  Service: Orthopedics;  Laterality: Left;    There were no vitals filed for this visit.   Subjective Assessment - 03/15/20 1628    Subjective Sore in legs from HEP and walking    Currently in Pain? --   muscle soreness                            [facility] Adult PT Treatment/Exercise - 03/15/20 0001      Knee/Hip  Exercises: Aerobic   Nustep L5 LE only   7 min      Knee/Hip Exercises: Standing   Other Standing Knee Exercises pressure into bsall TKE with weight shift to Rt to incr load      Knee/Hip Exercises: Seated   Long Arc Quad Weight 7 lbs.    Long Arc Quad Limitations 30 reps /LT  seated  RT x 30 LAQ with 5 sec hold and assist through termina extension    Sit to [employer] with UE support;20 reps   He uses momentum and UE on wlaker to complete tasks                   PT Short Term Goals - 03/15/20 1655      PT SHORT TERM GOAL #1   Title Pt and caregiver will be independent with HEP    Status Achieved      PT SHORT TERM GOAL #2   Title Pt will be able to perform 5x sit<>stand from w/c with min A    Baseline Able to get up from seat to walker with UE on walker and rocking and thrust body to standing    Status On-going      PT SHORT TERM GOAL #3   Title Pt  will be able to perform 5 SLR with no lag    Baseline still a lag RT    Status On-going      PT SHORT TERM GOAL #4   Title Pt will be able to tolerate standing ~30 sec for grooming task    Baseline Needs UE for safety  but able to stand for 5 miin or more ND TODAY STOOD FOR 30 SEC  but not safe without supervision             PT Long Term Goals - 02/01/20 1737      PT LONG TERM GOAL #1   Title Pt will be independent with advanced HEP    Time 6    Period Weeks    Status New    Target Date 03/14/20      PT LONG TERM GOAL #2   Title Pt will be able to amb 5' with RW min A to get into his bathroom    Baseline Unable    Time 6    Period Weeks    Status New    Target Date 02/22/20      PT LONG TERM GOAL #3   Title Pt will be able to perform 5x sit<>stand with SBA    Time 6    Period Weeks    Status New    Target Date 03/14/20      PT LONG TERM GOAL #4   Title Pt will have improved FOTO score to 55%    Baseline 79% limitation    Time 6    Period Weeks    Status New    Target Date 03/14/20      PT LONG  TERM GOAL #5   Title Pt will be able to perform stand pivot t/fs mod I with RW    Baseline Unable    Time 6    Period Weeks    Status New    Target Date 03/14/20                 Plan - 03/15/20 1627    Clinical Impression Statement Balance and sit to stand from high mat better with less use of UE for banace and weight shift.   Worked on Restaurant manager, fast food ball in standing for TKE and he was able to keep contraction but with incr load he was not able to maintain the contraction fully    PT Treatment/Interventions ADLs/Self Care Home Management;Aquatic Therapy;Cryotherapy;Electrical Stimulation;Iontophoresis 4mg /ml Dexamethasone;Moist Heat;Ultrasound;Gait training;Stair training;Functional mobility training;Therapeutic activities;Therapeutic exercise;Balance training;Neuromuscular re-education;Patient/family education;Orthotic Fit/Training;Manual techniques;Scar mobilization;Passive range of motion;Taping;Dry needling;Vasopneumatic Device    PT Next Visit Plan Continue knee ROM and strengthening. Work on standing tolerance/gait./ sitr to stand from high surface.  electric stim with quad exercises  progress visit 10 next (visit 1 02/01/20)    PT Home Exercise Plan Access Code [email or web address] ( QS , SAQ, SLR and heel slides with strap, march)  LAQ    Consulted and Agree with Plan of Care Patient           Patient will benefit from skilled therapeutic intervention in order to improve the following deficits and impairments:  Abnormal gait, Decreased balance, Decreased mobility, Difficulty walking, Hypomobility, Decreased range of motion, Improper body mechanics, Obesity, Decreased activity tolerance, Decreased strength, Increased fascial restricitons, Impaired flexibility  Visit Diagnosis: Stiffness of left knee, not elsewhere classified  Muscle weakness (generalized)  Stiffness of right knee, not elsewhere classified  Rupture of left quadriceps tendon, initial encounter  Rupture of patellar  tendon, right, initial encounter  Other abnormalities of gait and mobility     Problem List Patient Active Problem List   Diagnosis Date Noted  . Hypertension 10/20/2019  . Sleep apnea 10/20/2019  . Atrial fibrillation (HCC) 10/20/2019  . Knee pain 10/20/2019  . Diabetes mellitus without complication (HCC)   . CHF (congestive heart failure) (HCC)   . Gout   . Rupture of left quadriceps tendon   . Rupture of patellar tendon, right, initial encounter   . ATN (acute tubular necrosis) [facility])     Caprice Red  PT 03/15/2020, 5:14 PM  [facility] [address] [place], [place], [address] Phone: [phone]   Fax:  [phone]  Name: Douglas [name] MRN: [id number] Date of Birth: December 23, 1970

## 2020-03-20 ENCOUNTER — Ambulatory Visit: Payer: Medicare Other | Attending: Orthopedic Surgery

## 2020-03-20 ENCOUNTER — Other Ambulatory Visit: Payer: Self-pay

## 2020-03-20 DIAGNOSIS — S76112A Strain of left quadriceps muscle, fascia and tendon, initial encounter: Secondary | ICD-10-CM | POA: Insufficient documentation

## 2020-03-20 DIAGNOSIS — M25661 Stiffness of right knee, not elsewhere classified: Secondary | ICD-10-CM | POA: Diagnosis not present

## 2020-03-20 DIAGNOSIS — S86811A Strain of other muscle(s) and tendon(s) at lower leg level, right leg, initial encounter: Secondary | ICD-10-CM | POA: Diagnosis not present

## 2020-03-20 DIAGNOSIS — M25662 Stiffness of left knee, not elsewhere classified: Secondary | ICD-10-CM | POA: Insufficient documentation

## 2020-03-20 DIAGNOSIS — Z7689 Persons encountering health services in other specified circumstances: Secondary | ICD-10-CM | POA: Diagnosis not present

## 2020-03-20 DIAGNOSIS — M6281 Muscle weakness (generalized): Secondary | ICD-10-CM | POA: Diagnosis not present

## 2020-03-20 DIAGNOSIS — R2689 Other abnormalities of gait and mobility: Secondary | ICD-10-CM | POA: Insufficient documentation

## 2020-03-20 NOTE — Therapy (Signed)
[facility] [address] [place], [place], [address] Phone: [phone]   Fax:  [phone]  Physical Therapy Treatment  Patient Details  Name: Douglas [name] MRN: [id number] Date of Birth: [birth year] Referring Provider (PT): Dorene Grebe Progress Note Reporting Period 02/01/20 to 03/19/20 See note below for Objective Data and Assessment of Progress/Goals.       Encounter Date: 03/20/2020   PT End of Session - 03/20/20 1539    Visit Number 10    Number of Visits 28    Date for PT Re-Evaluation 05/18/20    Authorization Time Period FOTO visit #12    Progress Note Due on Visit 20    PT Start Time 0400           Past Medical History:  Diagnosis Date  . CHF (congestive heart failure) (HCC)   . Degenerative arthritis   . Depression   . Diabetes mellitus without complication (HCC)   . GERD (gastroesophageal reflux disease)   . Gout   . HLD (hyperlipidemia)   . Hypertension   . Seasonal allergies     Past Surgical History:  Procedure Laterality Date  . KNEE ARTHROSCOPY Left 11/01/2019   Procedure: ARTHROSCOPY KNEE AND MENISCUS DEBRIDEMENT;  Surgeon: Cammy Copa, MD;  Location: [facility] OR;  Service: Orthopedics;  Laterality: Left;  . KNEE ARTHROSCOPY WITH PATELLAR TENDON REPAIR Right 11/01/2019   Procedure: KNEE ARTHROSCOPY WITH PATELLAR TENDON REPAIR;  Surgeon: Cammy Copa, MD;  Location: [facility] OR;  Service: Orthopedics;  Laterality: Right;  . TENDON REPAIR Left 11/01/2019   Procedure: QUADRICEP TENDON REPAIR;  Surgeon: Cammy Copa, MD;  Location[facility] OR;  Service: Orthopedics;  Laterality: Left;    There were no vitals filed for this visit.       [facility] PT Assessment - 03/20/20 0001      Assessment   Medical Diagnosis S76.11 Rupture of L quad, S86.811 Rupture of R patellar tendon    Referring Provider (PT) Dorene Grebe      Precautions   Precautions Fall      Restrictions   Weight Bearing  Restrictions No      AROM   Right Knee Extension -37    Right Knee Flexion 118    Left Knee Extension -10    Left Knee Flexion 117      PROM   Right Knee Extension -5    Right Knee Flexion 125    Left Knee Extension -5    Left Knee Flexion 122      Strength   Right Hip Flexion 4/5    Right Hip External Rotation  4+/5    Right Hip Internal Rotation 4+/5    Right Hip ABduction 4/5    Left Hip Flexion 4+/5    Left Hip External Rotation 4+/5    Left Hip Internal Rotation 4+/5    Left Hip ABduction 4+/5    Right Knee Flexion 4+/5    Right Knee Extension 2+/5    Left Knee Flexion 4+/5    Left Knee Extension 4/5                         [facility] Adult PT Treatment/Exercise - 03/20/20 0001      Ambulation/Gait   Ambulation Distance (Feet) --   walked in clinic for all time and did not use wheel chair    Assistive device Rolling walker    Gait Pattern Step-through pattern  Ambulation Surface Level;Indoor    Gait Comments Walked in and out of small BR without help.  Also walked in bars with Lt hand  only  step through small steps.       Knee/Hip Exercises: Aerobic   Nustep L5 LE only   9 min      Knee/Hip Exercises: Standing   Heel Raises Both;20 reps    Heel Raises Limitations and DF x 20 RT/LT walker for support     Other Standing Knee Exercises pressure into bsall TKE with weight shift to Rt to incr load    Other Standing Knee Exercises stoood RT foot on scale and able to load RT leg with 2809 pounds keeping knee straight  UE support on walker x 15 reps       Knee/Hip Exercises: Seated   Sit to [employer] 20 reps;without UE support                    PT Short Term Goals - 03/15/20 1655      PT SHORT TERM GOAL #1   Title Pt and caregiver will be independent with HEP    Status Achieved      PT SHORT TERM GOAL #2   Title Pt will be able to perform 5x sit<>stand from w/c with min A    Baseline Able to get up from seat to walker with UE on walker and  rocking and thrust body to standing    Status On-going      PT SHORT TERM GOAL #3   Title Pt will be able to perform 5 SLR with no lag    Baseline still a lag RT    Status On-going      PT SHORT TERM GOAL #4   Title Pt will be able to tolerate standing ~30 sec for grooming task    Baseline Needs UE for safety  but able to stand for 5 miin or more ND TODAY STOOD FOR 30 SEC  but not safe without supervision             PT Long Term Goals - 02/01/20 1737      PT LONG TERM GOAL #1   Title Pt will be independent with advanced HEP    Time 6    Period Weeks    Status New    Target Date 03/14/20      PT LONG TERM GOAL #2   Title Pt will be able to amb 5' with RW min A to get into his bathroom    Baseline Unable    Time 6    Period Weeks    Status New    Target Date 02/22/20      PT LONG TERM GOAL #3   Title Pt will be able to perform 5x sit<>stand with SBA    Time 6    Period Weeks    Status New    Target Date 03/14/20      PT LONG TERM GOAL #4   Title Pt will have improved FOTO score to 55%    Baseline 79% limitation    Time 6    Period Weeks    Status New    Target Date 03/14/20      PT LONG TERM GOAL #5   Title Pt will be able to perform stand pivot t/fs mod I with RW    Baseline Unable    Time 6    Period Weeks    Status  New    Target Date 03/14/20                 Plan - 03/20/20 1711    Clinical Impression Statement He has improved strength in both LE and ROM of both knees but serious RT quad lag makes walking without walker unsafe. Will try to lock brace and walk with unilaterl device  next session    PT Treatment/Interventions ADLs/Self Care Home Management;Aquatic Therapy;Cryotherapy;Electrical Stimulation;Iontophoresis 4mg /ml Dexamethasone;Moist Heat;Ultrasound;Gait training;Stair training;Functional mobility training;Therapeutic activities;Therapeutic exercise;Balance training;Neuromuscular re-education;Patient/family education;Orthotic  Fit/Training;Manual techniques;Scar mobilization;Passive range of motion;Taping;Dry needling;Vasopneumatic Device    PT Next Visit Plan Trial of walkin g with unilateral device if can lock brace    PT Home Exercise Plan Access Code [email or web address] ( QS , SAQ, SLR and heel slides with strap, march)  LAQ    Consulted and Agree with Plan of Care Patient           Patient will benefit from skilled therapeutic intervention in order to improve the following deficits and impairments:     Visit Diagnosis: Stiffness of left knee, not elsewhere classified  Muscle weakness (generalized)  Stiffness of right knee, not elsewhere classified  Rupture of left quadriceps tendon, initial encounter  Rupture of patellar tendon, right, initial encounter     Problem List Patient Active Problem List   Diagnosis Date Noted  . Hypertension 10/20/2019  . Sleep apnea 10/20/2019  . Atrial fibrillation (HCC) 10/20/2019  . Knee pain 10/20/2019  . Diabetes mellitus without complication (HCC)   . CHF (congestive heart failure) (HCC)   . Gout   . Rupture of left quadriceps tendon   . Rupture of patellar tendon, right, initial encounter   . ATN (acute tubular necrosis) [facility])     [facility]  PT 03/20/2020, 5:13 PM  [facility] [address] [place], [place], [place] Phone: [phone]   Fax:  [phone]  Name: Douglas [name] MRN: [name] Date of Birth: [birth year]

## 2020-03-22 ENCOUNTER — Other Ambulatory Visit: Payer: Self-pay

## 2020-03-22 ENCOUNTER — Ambulatory Visit: Payer: Medicare Other

## 2020-03-22 DIAGNOSIS — R2689 Other abnormalities of gait and mobility: Secondary | ICD-10-CM | POA: Diagnosis not present

## 2020-03-22 DIAGNOSIS — M25661 Stiffness of right knee, not elsewhere classified: Secondary | ICD-10-CM

## 2020-03-22 DIAGNOSIS — M6281 Muscle weakness (generalized): Secondary | ICD-10-CM | POA: Diagnosis not present

## 2020-03-22 DIAGNOSIS — Z7689 Persons encountering health services in other specified circumstances: Secondary | ICD-10-CM | POA: Diagnosis not present

## 2020-03-22 DIAGNOSIS — S86811A Strain of other muscle(s) and tendon(s) at lower leg level, right leg, initial encounter: Secondary | ICD-10-CM | POA: Diagnosis not present

## 2020-03-22 DIAGNOSIS — S76112A Strain of left quadriceps muscle, fascia and tendon, initial encounter: Secondary | ICD-10-CM

## 2020-03-22 DIAGNOSIS — M25662 Stiffness of left knee, not elsewhere classified: Secondary | ICD-10-CM

## 2020-03-22 NOTE — Therapy (Signed)
[facility] [address] [place], [place], [address] Phone: [phone]   Fax:  [phone]  Physical Therapy Treatment  Patient Details  Name: Douglas [name] MRN: [id number] Date of Birth: 1971-05-27 Referring Provider (PT): Dorene Grebe   Encounter Date: 03/22/2020   PT End of Session - 03/22/20 1353    Visit Number 11    Number of Visits 28    Date for PT Re-Evaluation 05/18/20    Authorization Type UHC MCR    Authorization Time Period FOTO visit #12    Progress Note Due on Visit 20    PT Start Time 0155    PT Stop Time 0245    PT Time Calculation (min) 50 min    Activity Tolerance Patient tolerated treatment well    Behavior During Therapy [facility] for tasks assessed/performed           Past Medical History:  Diagnosis Date  . CHF (congestive heart failure) (HCC)   . Degenerative arthritis   . Depression   . Diabetes mellitus without complication (HCC)   . GERD (gastroesophageal reflux disease)   . Gout   . HLD (hyperlipidemia)   . Hypertension   . Seasonal allergies     Past Surgical History:  Procedure Laterality Date  . KNEE ARTHROSCOPY Left 11/01/2019   Procedure: ARTHROSCOPY KNEE AND MENISCUS DEBRIDEMENT;  Surgeon: Cammy Copa, MD;  Location: [facility] OR;  Service: Orthopedics;  Laterality: Left;  . KNEE ARTHROSCOPY WITH PATELLAR TENDON REPAIR Right 11/01/2019   Procedure: KNEE ARTHROSCOPY WITH PATELLAR TENDON REPAIR;  Surgeon: Cammy Copa, MD;  Location: [facility] OR;  Service: Orthopedics;  Laterality: Right;  . TENDON REPAIR Left 11/01/2019   Procedure: QUADRICEP TENDON REPAIR;  Surgeon: Cammy Copa, MD;  Location: [facility] OR;  Service: Orthopedics;  Laterality: Left;    There were no vitals filed for this visit.   Subjective Assessment - 03/22/20 1401    Subjective No pain , no complaints/.                             [facility] Adult PT Treatment/Exercise - 03/22/20 0001       Ambulation/Gait   Assistive device Rolling walker;L Axillary Crutch;Straight cane    Gait Pattern Step-through pattern   but all decreased in length   Ambulation Surface Level;Indoor    Gait Comments Walked 50 feet with 1 crutch , 1 SPC , no device (+2 CG with belt)   No LOB       Knee/Hip Exercises: Aerobic   Nustep L5 LE only   9 min      Knee/Hip Exercises: Seated   Long Arc Quad Limitations 30 reps 8# RT , 10 # LT     Other Seated Knee/Hip Exercises Sit to stand from 24/22/20/18 inch seat x 5 each  no UE assist  at lowest point hips below knees     Sit to Sand 10 reps                  PT Education - 03/22/20 1436    Education Details Asked him to not try to walk at home without walker due to fall risk    Person(s) Educated Patient    Methods Explanation    Comprehension Verbalized understanding            PT Short Term Goals - 03/15/20 1655      PT SHORT TERM  GOAL #1   Title Pt and caregiver will be independent with HEP    Status Achieved      PT SHORT TERM GOAL #2   Title Pt will be able to perform 5x sit<>stand from w/c with min A    Baseline Able to get up from seat to walker with UE on walker and rocking and thrust body to standing    Status On-going      PT SHORT TERM GOAL #3   Title Pt will be able to perform 5 SLR with no lag    Baseline still a lag RT    Status On-going      PT SHORT TERM GOAL #4   Title Pt will be able to tolerate standing ~30 sec for grooming task    Baseline Needs UE for safety  but able to stand for 5 miin or more ND TODAY STOOD FOR 30 SEC  but not safe without supervision             PT Long Term Goals - 02/01/20 1737      PT LONG TERM GOAL #1   Title Pt will be independent with advanced HEP    Time 6    Period Weeks    Status New    Target Date 03/14/20      PT LONG TERM GOAL #2   Title Pt will be able to amb 5' with RW min A to get into his bathroom    Baseline Unable    Time 6    Period Weeks    Status  New    Target Date 02/22/20      PT LONG TERM GOAL #3   Title Pt will be able to perform 5x sit<>stand with SBA    Time 6    Period Weeks    Status New    Target Date 03/14/20      PT LONG TERM GOAL #4   Title Pt will have improved FOTO score to 55%    Baseline 79% limitation    Time 6    Period Weeks    Status New    Target Date 03/14/20      PT LONG TERM GOAL #5   Title Pt will be able to perform stand pivot t/fs mod I with RW    Baseline Unable    Time 6    Period Weeks    Status New    Target Date 03/14/20                 Plan - 03/22/20 1353    Clinical Impression Statement He was able to walk wi4h unilateral device and no device with close CG but no LOB  , step length short but step throudh. Able to stand from 18 inch height with momentum but no excessive.   and balance on standing.    PT Treatment/Interventions ADLs/Self Care Home Management;Aquatic Therapy;Cryotherapy;Electrical Stimulation;Iontophoresis 4mg /ml Dexamethasone;Moist Heat;Ultrasound;Gait training;Stair training;Functional mobility training;Therapeutic activities;Therapeutic exercise;Balance training;Neuromuscular re-education;Patient/family education;Orthotic Fit/Training;Manual techniques;Scar mobilization;Passive range of motion;Taping;Dry needling;Vasopneumatic Device    PT Next Visit Plan Continue working on walking with locked brace and continue to  sit to stand for general LE strength, Continue overall LE strengthening    PT Home Exercise Plan Access Code [email or web address] ( QS , SAQ, SLR and heel slides with strap, march)  LAQ    Consulted and Agree with Plan of Care Patient           Patient will benefit  from skilled therapeutic intervention in order to improve the following deficits and impairments:  Abnormal gait, Decreased balance, Decreased mobility, Difficulty walking, Hypomobility, Decreased range of motion, Improper body mechanics, Obesity, Decreased activity tolerance, Decreased strength,  Increased fascial restricitons, Impaired flexibility  Visit Diagnosis: Stiffness of left knee, not elsewhere classified  Muscle weakness (generalized)  Stiffness of right knee, not elsewhere classified  Rupture of left quadriceps tendon, initial encounter  Rupture of patellar tendon, right, initial encounter  Other abnormalities of gait and mobility     Problem List Patient Active Problem List   Diagnosis Date Noted  . Hypertension 10/20/2019  . Sleep apnea 10/20/2019  . Atrial fibrillation (HCC) 10/20/2019  . Knee pain 10/20/2019  . Diabetes mellitus without complication (HCC)   . CHF (congestive heart failure) (HCC)   . Gout   . Rupture of left quadriceps tendon   . Rupture of patellar tendon, right, initial encounter   . ATN (acute tubular necrosis) (HCC)     Caprice Red  PT 03/22/2020, 3:10 PM  [facility] [address] [place], [place], [address] Phone: [phone]   Fax:  [phone]  Name: Elden Brucato MRN: [id number] Date of Birth: October 04, 1970

## 2020-03-27 ENCOUNTER — Ambulatory Visit: Payer: Medicare Other

## 2020-03-27 ENCOUNTER — Other Ambulatory Visit: Payer: Self-pay

## 2020-03-27 DIAGNOSIS — R2689 Other abnormalities of gait and mobility: Secondary | ICD-10-CM | POA: Diagnosis not present

## 2020-03-27 DIAGNOSIS — M25662 Stiffness of left knee, not elsewhere classified: Secondary | ICD-10-CM | POA: Diagnosis not present

## 2020-03-27 DIAGNOSIS — M25661 Stiffness of right knee, not elsewhere classified: Secondary | ICD-10-CM | POA: Diagnosis not present

## 2020-03-27 DIAGNOSIS — S86811A Strain of other muscle(s) and tendon(s) at lower leg level, right leg, initial encounter: Secondary | ICD-10-CM

## 2020-03-27 DIAGNOSIS — Z7689 Persons encountering health services in other specified circumstances: Secondary | ICD-10-CM | POA: Diagnosis not present

## 2020-03-27 DIAGNOSIS — S76112A Strain of left quadriceps muscle, fascia and tendon, initial encounter: Secondary | ICD-10-CM

## 2020-03-27 DIAGNOSIS — M6281 Muscle weakness (generalized): Secondary | ICD-10-CM | POA: Diagnosis not present

## 2020-03-27 NOTE — Therapy (Signed)
[facility] Outpatient Rehabilitation [facility] [address] [place], [place], [address] Phone: [phone]   Fax:  [phone]  Physical Therapy Treatment  Patient Details  Name: Douglas [name] MRN: [id number] Date of Birth: 1970/07/15 Referring Provider (PT): Dorene Grebe   Encounter Date: 03/27/2020   PT End of Session - 03/27/20 1253    Visit Number 12    Number of Visits 28    Date for PT Re-Evaluation 05/18/20    Authorization Time Period FOTO visit #18    Progress Note Due on Visit 20    PT Start Time 1155    PT Stop Time 1245    PT Time Calculation (min) 50 min    Equipment Utilized During Treatment Gait belt    Activity Tolerance Patient tolerated treatment well    Behavior During Therapy [facility] for tasks assessed/performed           Past Medical History:  Diagnosis Date  . CHF (congestive heart failure) (HCC)   . Degenerative arthritis   . Depression   . Diabetes mellitus without complication (HCC)   . GERD (gastroesophageal reflux disease)   . Gout   . HLD (hyperlipidemia)   . Hypertension   . Seasonal allergies     Past Surgical History:  Procedure Laterality Date  . KNEE ARTHROSCOPY Left 11/01/2019   Procedure: ARTHROSCOPY KNEE AND MENISCUS DEBRIDEMENT;  Surgeon: Cammy Copa, MD;  Location: [facility] OR;  Service: Orthopedics;  Laterality: Left;  . KNEE ARTHROSCOPY WITH PATELLAR TENDON REPAIR Right 11/01/2019   Procedure: KNEE ARTHROSCOPY WITH PATELLAR TENDON REPAIR;  Surgeon: Cammy Copa, MD;  Location: [facility] OR;  Service: Orthopedics;  Laterality: Right;  . TENDON REPAIR Left 11/01/2019   Procedure: QUADRICEP TENDON REPAIR;  Surgeon: Cammy Copa, MD;  Location: [facility] OR;  Service: Orthopedics;  Laterality: Left;    There were no vitals filed for this visit.   Subjective Assessment - 03/27/20 1159    Subjective No new complaints . no pain    Currently in Pain? No/denies              [facility] PT Assessment -  03/27/20 0001      6 Minute Walk- Baseline   6 Minute Walk- Baseline yes      6 minute walk test results    Aerobic Endurance Distance Walked 425    Endurance additional comments no stopping with RW                         [facility] Adult PT Treatment/Exercise - 03/27/20 0001      Ambulation/Gait   Gait Pattern Step-through pattern;Decreased step length - right;Decreased step length - left    Ambulation Surface Level;Indoor    Gait Comments no device +2 CGA  100 feet .       Neuro Re-ed    Neuro Re-ed Details  standing ball toss to trampoline x 40 reps  no LOB      Knee/Hip Exercises: Aerobic   Nustep L5 LE only   10 min      Knee/Hip Exercises: Seated   Sit to Sand 10 reps         pivoting moving feet close GCA No LOB RT/LT           PT Short Term Goals - 03/27/20 1255      PT SHORT TERM GOAL #1   Title Pt and caregiver will be independent with HEP  Status Achieved      PT SHORT TERM GOAL #2   Title Pt will be able to perform 5x sit<>stand from w/c with min A    Status Achieved      PT SHORT TERM GOAL #3   Title Pt will be able to perform 5 SLR with no lag    Baseline still a lag RT    Status On-going      PT SHORT TERM GOAL #4   Title Pt will be able to tolerate standing ~30 sec for grooming task    Baseline stood and ball toss without LOB    Status Achieved             PT Long Term Goals - 03/27/20 1256      PT LONG TERM GOAL #1   Title Pt will be independent with advanced HEP    Status On-going      PT LONG TERM GOAL #2   Title Pt will be able to amb 5' with RW min A to get into his bathroom    Status Achieved      PT LONG TERM GOAL #3   Title Pt will be able to perform 5x sit<>stand with SBA    Status Achieved      PT LONG TERM GOAL #4   Title Pt will have improved FOTO score to 55%    Status Achieved      PT LONG TERM GOAL #5   Title Pt will be able to perform stand pivot t/fs mod I with RW    Status Achieved                  Plan - 03/27/20 1228    Clinical Impression Statement Walked farther with no device. 435 feet with 6 min walk test .  no LOB woith stand ball toss.   Doing better.  Still not safe to do activity on feet without guarding    PT Treatment/Interventions ADLs/Self Care Home Management;Aquatic Therapy;Cryotherapy;Electrical Stimulation;Iontophoresis 4mg /ml Dexamethasone;Moist Heat;Ultrasound;Gait training;Stair training;Functional mobility training;Therapeutic activities;Therapeutic exercise;Balance training;Neuromuscular re-education;Patient/family education;Orthotic Fit/Training;Manual techniques;Scar mobilization;Passive range of motion;Taping;Dry needling;Vasopneumatic Device    PT Next Visit Plan Continue working on walking with locked brace and continue to  sit to stand for general LE strength, Continue overall LE strengthening/balance    PT Home Exercise Plan Access Code [email or web address] ( QS , SAQ, SLR and heel slides with strap, march)  LAQ    Consulted and Agree with Plan of Care Patient           Patient will benefit from skilled therapeutic intervention in order to improve the following deficits and impairments:  Abnormal gait, Decreased balance, Decreased mobility, Difficulty walking, Hypomobility, Decreased range of motion, Improper body mechanics, Obesity, Decreased activity tolerance, Decreased strength, Increased fascial restricitons, Impaired flexibility  Visit Diagnosis: Stiffness of left knee, not elsewhere classified  Muscle weakness (generalized)  Stiffness of right knee, not elsewhere classified  Rupture of left quadriceps tendon, initial encounter  Rupture of patellar tendon, right, initial encounter  Other abnormalities of gait and mobility     Problem List Patient Active Problem List   Diagnosis Date Noted  . Hypertension 10/20/2019  . Sleep apnea 10/20/2019  . Atrial fibrillation (HCC) 10/20/2019  . Knee pain 10/20/2019  . Diabetes mellitus without  complication (HCC)   . CHF (congestive heart failure) (HCC)   . Gout   . Rupture of left quadriceps tendon   . Rupture of patellar tendon, right, initial  encounter   . ATN (acute tubular necrosis) [facility])     Caprice Red 03/27/2020, 12:58 PM  [facility] [address] [place], [place], [address] Phone: [phone]   Fax:  [phone]  Name: Douglas [name] MRN: [id number] Date of Birth: Jun 27, 1970

## 2020-03-28 DIAGNOSIS — M6281 Muscle weakness (generalized): Secondary | ICD-10-CM | POA: Diagnosis not present

## 2020-03-29 ENCOUNTER — Other Ambulatory Visit: Payer: Self-pay

## 2020-03-29 ENCOUNTER — Ambulatory Visit: Payer: Medicare Other

## 2020-03-29 DIAGNOSIS — M6281 Muscle weakness (generalized): Secondary | ICD-10-CM | POA: Diagnosis not present

## 2020-03-29 DIAGNOSIS — M25661 Stiffness of right knee, not elsewhere classified: Secondary | ICD-10-CM | POA: Diagnosis not present

## 2020-03-29 DIAGNOSIS — S76112A Strain of left quadriceps muscle, fascia and tendon, initial encounter: Secondary | ICD-10-CM | POA: Diagnosis not present

## 2020-03-29 DIAGNOSIS — S86811A Strain of other muscle(s) and tendon(s) at lower leg level, right leg, initial encounter: Secondary | ICD-10-CM | POA: Diagnosis not present

## 2020-03-29 DIAGNOSIS — R2689 Other abnormalities of gait and mobility: Secondary | ICD-10-CM | POA: Diagnosis not present

## 2020-03-29 DIAGNOSIS — Z7689 Persons encountering health services in other specified circumstances: Secondary | ICD-10-CM | POA: Diagnosis not present

## 2020-03-29 DIAGNOSIS — M25662 Stiffness of left knee, not elsewhere classified: Secondary | ICD-10-CM | POA: Diagnosis not present

## 2020-03-29 NOTE — Therapy (Signed)
[facility] [address] [place], [place], [address] Phone: [phone]   Fax:  [phone]  Physical Therapy Treatment  Patient Details  Name: Douglas [name] MRN: [id number] Date of Birth: 1971-01-30 Referring Provider (PT): Dorene Grebe   Encounter Date: 03/29/2020   PT End of Session - 03/29/20 1340    Visit Number 13    Number of Visits 28    Date for PT Re-Evaluation 05/18/20    Authorization Type UHC MCR    Authorization Time Period FOTO visit #18   KX visit 15    PT Start Time 0140    PT Stop Time 0225    PT Time Calculation (min) 45 min    Activity Tolerance Patient tolerated treatment well    Behavior During Therapy [facility] for tasks assessed/performed           Past Medical History:  Diagnosis Date  . CHF (congestive heart failure) (HCC)   . Degenerative arthritis   . Depression   . Diabetes mellitus without complication (HCC)   . GERD (gastroesophageal reflux disease)   . Gout   . HLD (hyperlipidemia)   . Hypertension   . Seasonal allergies     Past Surgical History:  Procedure Laterality Date  . KNEE ARTHROSCOPY Left 11/01/2019   Procedure: ARTHROSCOPY KNEE AND MENISCUS DEBRIDEMENT;  Surgeon: Cammy Copa, MD;  Location: [facility] OR;  Service: Orthopedics;  Laterality: Left;  . KNEE ARTHROSCOPY WITH PATELLAR TENDON REPAIR Right 11/01/2019   Procedure: KNEE ARTHROSCOPY WITH PATELLAR TENDON REPAIR;  Surgeon: Cammy Copa, MD;  Location: [facility] OR;  Service: Orthopedics;  Laterality: Right;  . TENDON REPAIR Left 11/01/2019   Procedure: QUADRICEP TENDON REPAIR;  Surgeon: Cammy Copa, MD;  Location: [facility] OR;  Service: Orthopedics;  Laterality: Left;    There were no vitals filed for this visit.   Subjective Assessment - 03/29/20 1432    Subjective Doing well . I am walking at home and doing the steps from the garage. No pain    Currently in Pain? No/denies               Treatment. :    Mr Boehning walked from lobby with RW supervised  300 feet to gym then did blue band row/extension punches and rotatoin RT/LT  X 15 each. Then he walked with [facility] in clinic and stopped at wall to do overhead reach bilaterally , single arm reach to RT RT and LT arm for weight shift to RT over RT leg.    Then sit to stand from 18  In  Height 3 sets of 5 . STS 5 reps in  33 Sec. Indicating  A fall risk.   Ended with Nu step L6  LE only 8 min                      PT Education - 03/29/20 1433    Education Details SReveiwed the 5x STS score and implication for balance. Suggested if supervised he should walk into clinic and leave WC at home if coomfortable.    Person(s) Educated Patient    Methods Explanation    Comprehension Verbalized understanding            PT Short Term Goals - 03/27/20 1255      PT SHORT TERM GOAL #1   Title Pt and caregiver will be independent with HEP    Status Achieved      PT SHORT  TERM GOAL #2   Title Pt will be able to perform 5x sit<>stand from w/c with min A    Status Achieved      PT SHORT TERM GOAL #3   Title Pt will be able to perform 5 SLR with no lag    Baseline still a lag RT    Status On-going      PT SHORT TERM GOAL #4   Title Pt will be able to tolerate standing ~30 sec for grooming task    Baseline stood and ball toss without LOB    Status Achieved             PT Long Term Goals - 03/29/20 1419      Additional Long Term Goals   Additional Long Term Goals Yes      PT LONG TERM GOAL #6   Title 5x sit to stand in 20 seconds    Time 4    Period Weeks    Status New      PT LONG TERM GOAL #7   Title Safely walk with SPC in clinic and at home with brace 75% of time    Time 4    Period Weeks    Status New      PT LONG TERM GOAL #8   Title He will walk into clinic with least restricted device.    Time 3    Period Weeks    Status New                 Plan - 03/29/20 1341    Clinical Impression Statement He  was able to stand from normal chair height. 5x STS He was slow with wide feet but this  gives [place] a new goal to work toward. Will try to not have WC back to allow for more ambualtion during session    PT Treatment/Interventions ADLs/Self Care Home Management;Aquatic Therapy;Cryotherapy;Electrical Stimulation;Iontophoresis 4mg /ml Dexamethasone;Moist Heat;Ultrasound;Gait training;Stair training;Functional mobility training;Therapeutic activities;Therapeutic exercise;Balance training;Neuromuscular re-education;Patient/family education;Orthotic Fit/Training;Manual techniques;Scar mobilization;Passive range of motion;Taping;Dry needling;Vasopneumatic Device    PT Next Visit Plan Continue working on walking with locked brace and continue to  sit to stand for general LE strength, Continue overall LE strengthening/balance    PT Home Exercise Plan Access Code [email or web address] ( QS , SAQ, SLR and heel slides with strap, march)  LAQ    Consulted and Agree with Plan of Care Patient           Patient will benefit from skilled therapeutic intervention in order to improve the following deficits and impairments:  Abnormal gait, Decreased balance, Decreased mobility, Difficulty walking, Hypomobility, Decreased range of motion, Improper body mechanics, Obesity, Decreased activity tolerance, Decreased strength, Increased fascial restricitons, Impaired flexibility  Visit Diagnosis: Muscle weakness (generalized)  Rupture of left quadriceps tendon, initial encounter  Rupture of patellar tendon, right, initial encounter     Problem List Patient Active Problem List   Diagnosis Date Noted  . Hypertension 10/20/2019  . Sleep apnea 10/20/2019  . Atrial fibrillation (HCC) 10/20/2019  . Knee pain 10/20/2019  . Diabetes mellitus without complication (HCC)   . CHF (congestive heart failure) (HCC)   . Gout   . Rupture of left quadriceps tendon   . Rupture of patellar tendon, right, initial encounter   . ATN (acute tubular  necrosis) (HCC)     12/20/2019   PT 03/29/2020, 2:36 PM  [facility] [address] [place], [place], [place] Phone: [phone]  Fax:  [phone]  Name: Givanni Staron MRN: [id number] Date of Birth: [birth year]

## 2020-04-03 ENCOUNTER — Ambulatory Visit: Payer: Medicare Other

## 2020-04-03 ENCOUNTER — Other Ambulatory Visit: Payer: Self-pay

## 2020-04-03 DIAGNOSIS — M25662 Stiffness of left knee, not elsewhere classified: Secondary | ICD-10-CM | POA: Diagnosis not present

## 2020-04-03 DIAGNOSIS — M6281 Muscle weakness (generalized): Secondary | ICD-10-CM | POA: Diagnosis not present

## 2020-04-03 DIAGNOSIS — M25661 Stiffness of right knee, not elsewhere classified: Secondary | ICD-10-CM | POA: Diagnosis not present

## 2020-04-03 DIAGNOSIS — S86811A Strain of other muscle(s) and tendon(s) at lower leg level, right leg, initial encounter: Secondary | ICD-10-CM

## 2020-04-03 DIAGNOSIS — S76112A Strain of left quadriceps muscle, fascia and tendon, initial encounter: Secondary | ICD-10-CM | POA: Diagnosis not present

## 2020-04-03 DIAGNOSIS — Z7689 Persons encountering health services in other specified circumstances: Secondary | ICD-10-CM | POA: Diagnosis not present

## 2020-04-03 DIAGNOSIS — R2689 Other abnormalities of gait and mobility: Secondary | ICD-10-CM | POA: Diagnosis not present

## 2020-04-03 NOTE — Therapy (Signed)
[facility]patient [facility] [address] [place], [place], [address] Phone: [phone]   Fax:  [phone]  Physical Therapy Treatment  Patient Details  Name: Douglas [name] MRN: [id number] Date of Birth: [birth year] Referring Provider (PT): Dorene Grebe   Encounter Date: 04/03/2020   PT End of Session - 04/03/20 1138    Visit Number 14    Number of Visits 28    Date for PT Re-Evaluation 05/18/20    Authorization Type UHC MCR    Authorization Time Period FOTO visit #18   KX visit 15    Progress Note Due on Visit 20    PT Start Time 1125    PT Stop Time 1215    PT Time Calculation (min) 50 min    Activity Tolerance Patient tolerated treatment well    Behavior During Therapy [facility] for tasks assessed/performed           Past Medical History:  Diagnosis Date   CHF (congestive heart failure) (HCC)    Degenerative arthritis    Depression    Diabetes mellitus without complication (HCC)    GERD (gastroesophageal reflux disease)    Gout    HLD (hyperlipidemia)    Hypertension    Seasonal allergies     Past Surgical History:  Procedure Laterality Date   KNEE ARTHROSCOPY Left 11/01/2019   Procedure: ARTHROSCOPY KNEE AND MENISCUS DEBRIDEMENT;  Surgeon: Cammy Copa, MD;  Location: [facility] OR;  Service: Orthopedics;  Laterality: Left;   KNEE ARTHROSCOPY WITH PATELLAR TENDON REPAIR Right 11/01/2019   Procedure: KNEE ARTHROSCOPY WITH PATELLAR TENDON REPAIR;  Surgeon: Cammy Copa, MD;  Location: [facility] OR;  Service: Orthopedics;  Laterality: Right;   TENDON REPAIR Left 11/01/2019   Procedure: QUADRICEP TENDON REPAIR;  Surgeon: Cammy Copa, MD;  Location: [facility] OR;  Service: Orthopedics;  Laterality: Left;    There were no vitals filed for this visit.   Subjective Assessment - 04/03/20 1220    Subjective No pain nd walked into clinic with  RW and using more at home/ WC less                              [facility] Adult PT Treatment/Exercise - 04/03/20 0001      Ambulation/Gait   Ambulation Distance (Feet) 325 Feet    Assistive device Straight cane    Gait Pattern Step-through pattern;Decreased step length - right;Decreased step length - left    Ambulation Surface Level;Indoor    Gait Comments +1 CGA, ^ min  walk test with SPC started but he was too tired to do the test. .       Knee/Hip Exercises: Aerobic   Nustep L5 LE only   10 min      Knee/Hip Exercises: Seated   Long Arc Quad Limitations 30 reps 8# RT , 10 # LT     Sit to Sand 10 reps;3 sets   cane in front                   PT Short Term Goals - 03/27/20 1255      PT SHORT TERM GOAL #1   Title Pt and caregiver will be independent with HEP    Status Achieved      PT SHORT TERM GOAL #2   Title Pt will be able to perform 5x sit<>stand from w/c with min A    Status Achieved  PT SHORT TERM GOAL #3   Title Pt will be able to perform 5 SLR with no lag    Baseline still a lag RT    Status On-going      PT SHORT TERM GOAL #4   Title Pt will be able to tolerate standing ~30 sec for grooming task    Baseline stood and ball toss without LOB    Status Achieved             PT Long Term Goals - 03/29/20 1419      Additional Long Term Goals   Additional Long Term Goals Yes      PT LONG TERM GOAL #6   Title 5x sit to stand in 20 seconds    Time 4    Period Weeks    Status New      PT LONG TERM GOAL #7   Title Safely walk with SPC in clinic and at home with brace 75% of time    Time 4    Period Weeks    Status New      PT LONG TERM GOAL #8   Title He will walk into clinic with least restricted device.    Time 3    Period Weeks    Status New                 Plan - 04/03/20 1139    Clinical Impression Statement His balance and endurance are much better but he is still limited and safety requires CG with SPC but Independent with  RW. Still RT quad lag but  can handle 8# in available AROM    PT Treatment/Interventions ADLs/Self Care Home Management;Aquatic Therapy;Cryotherapy;Electrical Stimulation;Iontophoresis 4mg /ml Dexamethasone;Moist Heat;Ultrasound;Gait training;Stair training;Functional mobility training;Therapeutic activities;Therapeutic exercise;Balance training;Neuromuscular re-education;Patient/family education;Orthotic Fit/Training;Manual techniques;Scar mobilization;Passive range of motion;Taping;Dry needling;Vasopneumatic Device    PT Next Visit Plan Continue working on walking with locked brace and continue to  sit to stand for general LE strength, Continue overall LE strengthening/balance    PT Home Exercise Plan Access Code [email or web address] ( QS , SAQ, SLR and heel slides with strap, march)  LAQ    Consulted and Agree with Plan of Care Patient           Patient will benefit from skilled therapeutic intervention in order to improve the following deficits and impairments:  Abnormal gait, Decreased balance, Decreased mobility, Difficulty walking, Hypomobility, Decreased range of motion, Improper body mechanics, Obesity, Decreased activity tolerance, Decreased strength, Increased fascial restricitons, Impaired flexibility  Visit Diagnosis: Muscle weakness (generalized)  Rupture of left quadriceps tendon, initial encounter  Rupture of patellar tendon, right, initial encounter     Problem List Patient Active Problem List   Diagnosis Date Noted   Hypertension 10/20/2019   Sleep apnea 10/20/2019   Atrial fibrillation (HCC) 10/20/2019   Knee pain 10/20/2019   Diabetes mellitus without complication (HCC)    CHF (congestive heart failure) (HCC)    Gout    Rupture of left quadriceps tendon    Rupture of patellar tendon, right, initial encounter    ATN (acute tubular necrosis) (HCC)     12/20/2019  PT 04/03/2020, 12:21 PM  [facility] [address] [place],  [place], [place] Phone: [phone]   Fax:  [phone]  Name: Thaddius Manes MRN: [name] Date of Birth: 1971/02/27

## 2020-04-05 ENCOUNTER — Ambulatory Visit: Payer: Medicare Other

## 2020-04-05 ENCOUNTER — Other Ambulatory Visit: Payer: Self-pay

## 2020-04-05 DIAGNOSIS — S76112A Strain of left quadriceps muscle, fascia and tendon, initial encounter: Secondary | ICD-10-CM | POA: Diagnosis not present

## 2020-04-05 DIAGNOSIS — M6281 Muscle weakness (generalized): Secondary | ICD-10-CM

## 2020-04-05 DIAGNOSIS — M25662 Stiffness of left knee, not elsewhere classified: Secondary | ICD-10-CM | POA: Diagnosis not present

## 2020-04-05 DIAGNOSIS — S86811A Strain of other muscle(s) and tendon(s) at lower leg level, right leg, initial encounter: Secondary | ICD-10-CM | POA: Diagnosis not present

## 2020-04-05 DIAGNOSIS — R2689 Other abnormalities of gait and mobility: Secondary | ICD-10-CM | POA: Diagnosis not present

## 2020-04-05 DIAGNOSIS — Z7689 Persons encountering health services in other specified circumstances: Secondary | ICD-10-CM | POA: Diagnosis not present

## 2020-04-05 DIAGNOSIS — M25661 Stiffness of right knee, not elsewhere classified: Secondary | ICD-10-CM | POA: Diagnosis not present

## 2020-04-05 NOTE — Therapy (Signed)
[facility] [address] [place], [place], [address] Phone: [phone]   Fax:  [phone]  Physical Therapy Treatment  Patient Details  Name: Douglas [name] MRN: [id number] Date of Birth: 01/26/1971 Referring Provider (PT): Dorene Grebe   Encounter Date: 04/05/2020   PT End of Session - 04/05/20 1222    Visit Number 15    Number of Visits 28    Date for PT Re-Evaluation 05/18/20    Authorization Type UHC MCR    Authorization Time Period FOTO visit #18   KX visit 15    Progress Note Due on Visit 20    PT Start Time 1210    PT Stop Time 1255    PT Time Calculation (min) 45 min    Equipment Utilized During Treatment Gait belt    Activity Tolerance Patient tolerated treatment well    Behavior During Therapy WFL for tasks assessed/performed           Past Medical History:  Diagnosis Date  . CHF (congestive heart failure) (HCC)   . Degenerative arthritis   . Depression   . Diabetes mellitus without complication (HCC)   . GERD (gastroesophageal reflux disease)   . Gout   . HLD (hyperlipidemia)   . Hypertension   . Seasonal allergies     Past Surgical History:  Procedure Laterality Date  . KNEE ARTHROSCOPY Left 11/01/2019   Procedure: ARTHROSCOPY KNEE AND MENISCUS DEBRIDEMENT;  Surgeon: Cammy Copa, MD;  Location: [facility] OR;  Service: Orthopedics;  Laterality: Left;  . KNEE ARTHROSCOPY WITH PATELLAR TENDON REPAIR Right 11/01/2019   Procedure: KNEE ARTHROSCOPY WITH PATELLAR TENDON REPAIR;  Surgeon: Cammy Copa, MD;  Location: [facility] OR;  Service: Orthopedics;  Laterality: Right;  . TENDON REPAIR Left 11/01/2019   Procedure: QUADRICEP TENDON REPAIR;  Surgeon: Cammy Copa, MD;  Location: [facility] OR;  Service: Orthopedics;  Laterality: Left;    There were no vitals filed for this visit.   Subjective Assessment - 04/05/20 1224    Subjective No complints                              [facility] Adult PT Treatment/Exercise - 04/05/20 0001      Ambulation/Gait   Assistive device Straight cane    Gait Pattern Step-through pattern;Decreased step length - right;Decreased step length - left    Ambulation Surface Level;Indoor    Stairs Assistance 4: Min guard    Stair Management Technique Two rails;Step to pattern    Number of Stairs 12    Height of Stairs 6    Gait Comments Used railing to pull self up leading  with Lt leg. Walked in bars with No UE support and walked backward with min UE support of bars  HR 140 post      Neuro Re-ed    Neuro Re-ed Details  wall bumps back to wall cued to use hips and anterior ankles to control movment      Knee/Hip Exercises: Aerobic   Nustep L6 LE only   10 min                  PT Education - 04/05/20 1303    Education Details Wall bump amd why it may be worth while to do thwm for static balance. He did lose his balance 1x today posteriorly and light assist from PT to stabilize. Slow but steady progress  Person(s) Educated Patient    Methods Explanation    Comprehension Verbalized understanding            PT Short Term Goals - 03/27/20 1255      PT SHORT TERM GOAL #1   Title Pt and caregiver will be independent with HEP    Status Achieved      PT SHORT TERM GOAL #2   Title Pt will be able to perform 5x sit<>stand from w/c with min A    Status Achieved      PT SHORT TERM GOAL #3   Title Pt will be able to perform 5 SLR with no lag    Baseline still a lag RT    Status On-going      PT SHORT TERM GOAL #4   Title Pt will be able to tolerate standing ~30 sec for grooming task    Baseline stood and ball toss without LOB    Status Achieved             PT Long Term Goals - 03/29/20 1419      Additional Long Term Goals   Additional Long Term Goals Yes      PT LONG TERM GOAL #6   Title 5x sit to stand in 20 seconds    Time 4    Period Weeks    Status New      PT LONG TERM GOAL #7   Title Safely walk with  SPC in clinic and at home with brace 75% of time    Time 4    Period Weeks    Status New      PT LONG TERM GOAL #8   Title He will walk into clinic with least restricted device.    Time 3    Period Weeks    Status New                 Plan - 04/05/20 1223    Clinical Impression Statement One minor assist with LOB. [place] progress.   His BP  high post all activity but decreases to close to 100 BPM with 1-2 min rest.  Worked on posterior LOB  with wall bump.    Will continue with stairs and walking decr devicew as able.    Examination-Activity Limitations Bathing;Dressing;Transfers;Bed Mobility;Hygiene/Grooming;Bend;Lift;Squat;Locomotion Level;Caring for Others;Stairs;Carry;Stand;Toileting    PT Treatment/Interventions ADLs/Self Care Home Management;Aquatic Therapy;Cryotherapy;Electrical Stimulation;Iontophoresis 4mg /ml Dexamethasone;Moist Heat;Ultrasound;Gait training;Stair training;Functional mobility training;Therapeutic activities;Therapeutic exercise;Balance training;Neuromuscular re-education;Patient/family education;Orthotic Fit/Training;Manual techniques;Scar mobilization;Passive range of motion;Taping;Dry needling;Vasopneumatic Device    PT Next Visit Plan Continue working on walking with locked brace and decr device as able safely and continue to  sit to stand for general LE strength, Continue overall LE strengthening/balance    PT Home Exercise Plan Access Code [email or web address] ( QS , SAQ, SLR and heel slides with strap, march)  LAQ,  Wall bumps    Consulted and Agree with Plan of Care Patient           Patient will benefit from skilled therapeutic intervention in order to improve the following deficits and impairments:  Abnormal gait, Decreased balance, Decreased mobility, Difficulty walking, Hypomobility, Decreased range of motion, Improper body mechanics, Obesity, Decreased activity tolerance, Decreased strength, Increased fascial restricitons, Impaired flexibility  Visit  Diagnosis: Muscle weakness (generalized)  Rupture of left quadriceps tendon, initial encounter  Rupture of patellar tendon, right, initial encounter     Problem List Patient Active Problem List   Diagnosis Date Noted  . Hypertension 10/20/2019  .  Sleep apnea 10/20/2019  . Atrial fibrillation (HCC) 10/20/2019  . Knee pain 10/20/2019  . Diabetes mellitus without complication (HCC)   . CHF (congestive heart failure) (HCC)   . Gout   . Rupture of left quadriceps tendon   . Rupture of patellar tendon, right, initial encounter   . ATN (acute tubular necrosis) (HCC)     Caprice Red  PT 04/05/2020, 1:13 PM  [facility] [address] [place], [place], [address] Phone: [phone]   Fax:  [phone]  Name: Douglas [name] MRN: [id number] Date of Birth: [birth year]

## 2020-04-05 NOTE — Patient Instructions (Signed)
Wall bumps back to wall and walker in front.  X 10-20  With brace

## 2020-04-09 DIAGNOSIS — M6281 Muscle weakness (generalized): Secondary | ICD-10-CM | POA: Diagnosis not present

## 2020-04-10 ENCOUNTER — Ambulatory Visit: Payer: Medicare Other

## 2020-04-10 ENCOUNTER — Other Ambulatory Visit: Payer: Self-pay

## 2020-04-10 DIAGNOSIS — Z7689 Persons encountering health services in other specified circumstances: Secondary | ICD-10-CM | POA: Diagnosis not present

## 2020-04-10 DIAGNOSIS — S86811A Strain of other muscle(s) and tendon(s) at lower leg level, right leg, initial encounter: Secondary | ICD-10-CM | POA: Diagnosis not present

## 2020-04-10 DIAGNOSIS — S76112A Strain of left quadriceps muscle, fascia and tendon, initial encounter: Secondary | ICD-10-CM | POA: Diagnosis not present

## 2020-04-10 DIAGNOSIS — M25661 Stiffness of right knee, not elsewhere classified: Secondary | ICD-10-CM | POA: Diagnosis not present

## 2020-04-10 DIAGNOSIS — M25662 Stiffness of left knee, not elsewhere classified: Secondary | ICD-10-CM | POA: Diagnosis not present

## 2020-04-10 DIAGNOSIS — M6281 Muscle weakness (generalized): Secondary | ICD-10-CM

## 2020-04-10 DIAGNOSIS — R2689 Other abnormalities of gait and mobility: Secondary | ICD-10-CM

## 2020-04-10 NOTE — Therapy (Signed)
[facility] Outpatient Rehabilitation [facility] [address] [place], [place], [address] Phone: [phone]   Fax:  [phone]  Physical Therapy Treatment  Patient Details  Name: Douglas [name] MRN: [id number] Date of Birth: 01-14-1971 Referring Provider (PT): Dorene Grebe   Encounter Date: 04/10/2020   PT End of Session - 04/10/20 1523    Visit Number 16    Number of Visits 28    Date for PT Re-Evaluation 05/18/20    Authorization Type UHC MCR    Authorization Time Period FOTO visit #18   KX visit 15    Progress Note Due on Visit 20    PT Start Time 0320    PT Stop Time 0415    PT Time Calculation (min) 55 min    Activity Tolerance Patient tolerated treatment well    Behavior During Therapy [facility] for tasks assessed/performed           Past Medical History:  Diagnosis Date  . CHF (congestive heart failure) (HCC)   . Degenerative arthritis   . Depression   . Diabetes mellitus without complication (HCC)   . GERD (gastroesophageal reflux disease)   . Gout   . HLD (hyperlipidemia)   . Hypertension   . Seasonal allergies     Past Surgical History:  Procedure Laterality Date  . KNEE ARTHROSCOPY Left 11/01/2019   Procedure: ARTHROSCOPY KNEE AND MENISCUS DEBRIDEMENT;  Surgeon: Cammy Copa, MD;  Location: [facility] OR;  Service: Orthopedics;  Laterality: Left;  . KNEE ARTHROSCOPY WITH PATELLAR TENDON REPAIR Right 11/01/2019   Procedure: KNEE ARTHROSCOPY WITH PATELLAR TENDON REPAIR;  Surgeon: Cammy Copa, MD;  Location: [facility] OR;  Service: Orthopedics;  Laterality: Right;  . TENDON REPAIR Left 11/01/2019   Procedure: QUADRICEP TENDON REPAIR;  Surgeon: Cammy Copa, MD;  Location: [facility] OR;  Service: Orthopedics;  Laterality: Left;    There were no vitals filed for this visit.   Subjective Assessment - 04/10/20 1523    Subjective No complints. no pain    Currently in Pain? No/denies                             [facility]  Adult PT Treatment/Exercise - 04/10/20 1605      Ambulation/Gait   Ambulation/Gait Yes    Ambulation Distance (Feet) 300 Feet    Assistive device Straight cane    Gait Pattern Step-through pattern;Decreased stride length    Ambulation Surface Level;Indoor    Gait Comments Used railing to pull self up leading  with Lt leg. Walked in bars with No UE support and walked backward with min UE support of bars   Walked no device CGA 200 feet no LOB or knee buckle      Neuro Re-ed    Neuro Re-ed Details  wall bumps in bars x 20  static stand with PT providing ant/posterior push and pull for pt to rovide correction without usin bars for support.       Knee/Hip Exercises: Aerobic   Nustep L6 LE only   20 min      Knee/Hip Exercises: Standing   Other Standing Knee Exercises marching in place no UE support CGA of 1.   then walked to front no device.  CGA of 1.     Other Standing Knee Exercises hip extension x 20 RT / Lt, marching in place x 50 RT/LT  PT Short Term Goals - 03/27/20 1255      PT SHORT TERM GOAL #1   Title Pt and caregiver will be independent with HEP    Status Achieved      PT SHORT TERM GOAL #2   Title Pt will be able to perform 5x sit<>stand from w/c with min A    Status Achieved      PT SHORT TERM GOAL #3   Title Pt will be able to perform 5 SLR with no lag    Baseline still a lag RT    Status On-going      PT SHORT TERM GOAL #4   Title Pt will be able to tolerate standing ~30 sec for grooming task    Baseline stood and ball toss without LOB    Status Achieved             PT Long Term Goals - 03/29/20 1419      Additional Long Term Goals   Additional Long Term Goals Yes      PT LONG TERM GOAL #6   Title 5x sit to stand in 20 seconds    Time 4    Period Weeks    Status New      PT LONG TERM GOAL #7   Title Safely walk with SPC in clinic and at home with brace 75% of time    Time 4    Period Weeks    Status New      PT  LONG TERM GOAL #8   Title He will walk into clinic with least restricted device.    Time 3    Period Weeks    Status New                 Plan - 04/10/20 1524    PT Treatment/Interventions ADLs/Self Care Home Management;Aquatic Therapy;Cryotherapy;Electrical Stimulation;Iontophoresis 4mg /ml Dexamethasone;Moist Heat;Ultrasound;Gait training;Stair training;Functional mobility training;Therapeutic activities;Therapeutic exercise;Balance training;Neuromuscular re-education;Patient/family education;Orthotic Fit/Training;Manual techniques;Scar mobilization;Passive range of motion;Taping;Dry needling;Vasopneumatic Device    PT Next Visit Plan Continue working on walking with locked brace and decr device as able safely and continue to  sit to stand for general LE strength, Continue overall LE strengthening/balance    PT Home Exercise Plan Access Code [email or web address] ( QS , SAQ, SLR and heel slides with strap, march)  LAQ,  Wall bumps    Consulted and Agree with Plan of Care Patient           Patient will benefit from skilled therapeutic intervention in order to improve the following deficits and impairments:  Abnormal gait, Decreased balance, Decreased mobility, Difficulty walking, Hypomobility, Decreased range of motion, Improper body mechanics, Obesity, Decreased activity tolerance, Decreased strength, Increased fascial restricitons, Impaired flexibility  Visit Diagnosis: Muscle weakness (generalized)  Rupture of left quadriceps tendon, initial encounter  Rupture of patellar tendon, right, initial encounter  Stiffness of right knee, not elsewhere classified  Stiffness of left knee, not elsewhere classified  Other abnormalities of gait and mobility     Problem List Patient Active Problem List   Diagnosis Date Noted  . Hypertension 10/20/2019  . Sleep apnea 10/20/2019  . Atrial fibrillation (HCC) 10/20/2019  . Knee pain 10/20/2019  . Diabetes mellitus without complication (HCC)   .  CHF (congestive heart failure) (HCC)   . Gout   . Rupture of left quadriceps tendon   . Rupture of patellar tendon, right, initial encounter   . ATN (acute tubular necrosis) (HCC)     12/20/2019  M   PT 04/10/2020, 4:20 PM  [facility] [address] [place], [place], [address] Phone: [phone]   Fax:  [phone]  Name: Zakaree Mcclenahan MRN: [id number] Date of Birth: [birth year]

## 2020-04-12 ENCOUNTER — Other Ambulatory Visit: Payer: Self-pay

## 2020-04-12 ENCOUNTER — Ambulatory Visit: Payer: Medicare Other

## 2020-04-12 DIAGNOSIS — M25662 Stiffness of left knee, not elsewhere classified: Secondary | ICD-10-CM | POA: Diagnosis not present

## 2020-04-12 DIAGNOSIS — M6281 Muscle weakness (generalized): Secondary | ICD-10-CM | POA: Diagnosis not present

## 2020-04-12 DIAGNOSIS — S76112A Strain of left quadriceps muscle, fascia and tendon, initial encounter: Secondary | ICD-10-CM | POA: Diagnosis not present

## 2020-04-12 DIAGNOSIS — R2689 Other abnormalities of gait and mobility: Secondary | ICD-10-CM | POA: Diagnosis not present

## 2020-04-12 DIAGNOSIS — S86811A Strain of other muscle(s) and tendon(s) at lower leg level, right leg, initial encounter: Secondary | ICD-10-CM | POA: Diagnosis not present

## 2020-04-12 DIAGNOSIS — Z7689 Persons encountering health services in other specified circumstances: Secondary | ICD-10-CM | POA: Diagnosis not present

## 2020-04-12 DIAGNOSIS — M25661 Stiffness of right knee, not elsewhere classified: Secondary | ICD-10-CM | POA: Diagnosis not present

## 2020-04-12 NOTE — Therapy (Signed)
[facility] [address] [place], [place], [address] Phone: [phone]   Fax:  [phone]  Physical Therapy Treatment  Patient Details  Name: Douglas [name] MRN: [id number] Date of Birth: May 02, 1971 Referring Provider (PT): Douglas [name]   Encounter Date: 04/12/2020   PT End of Session - 04/12/20 1132    Visit Number 17    Number of Visits 28    Date for PT Re-Evaluation 05/18/20    Authorization Type UHC MCR    Authorization Time Period FOTO visit #18   KX visit 15    Progress Note Due on Visit 20    PT Start Time 1120    PT Stop Time 1210    PT Time Calculation (min) 50 min    Activity Tolerance Patient tolerated treatment well    Behavior During Therapy [facility] for tasks assessed/performed           Past Medical History:  Diagnosis Date  . CHF (congestive heart failure) (HCC)   . Degenerative arthritis   . Depression   . Diabetes mellitus without complication (HCC)   . GERD (gastroesophageal reflux disease)   . Gout   . HLD (hyperlipidemia)   . Hypertension   . Seasonal allergies     Past Surgical History:  Procedure Laterality Date  . KNEE ARTHROSCOPY Left 11/01/2019   Procedure: ARTHROSCOPY KNEE AND MENISCUS DEBRIDEMENT;  Surgeon: Douglas Copa, MD;  Location: [facility] OR;  Service: Orthopedics;  Laterality: Left;  . KNEE ARTHROSCOPY WITH PATELLAR TENDON REPAIR Right 11/01/2019   Procedure: KNEE ARTHROSCOPY WITH PATELLAR TENDON REPAIR;  Surgeon: Douglas Copa, MD;  Location: [facility] OR;  Service: Orthopedics;  Laterality: Right;  . TENDON REPAIR Left 11/01/2019   Procedure: QUADRICEP TENDON REPAIR;  Surgeon: Douglas Copa, MD;  Location: [facility] OR;  Service: Orthopedics;  Laterality: Left;    There were no vitals filed for this visit.                      [facility] Adult PT Treatment/Exercise - 04/12/20 0001      Ambulation/Gait   Gait Pattern Step-through pattern;Decreased stride  length    Ambulation Surface Level;Indoor    Gait Comments Decr time on RT Le     Walked out to front at end with [facility] and time on RT leg was improved      Therapeutic Activites    Therapeutic Activities Other Therapeutic Activities    Other Therapeutic Activities pushing work sled  no weight , 60# all x 75 feet  and 60# x 2    40 #  then  2 hand carry     30 feet  0# box , 5 # in box 10# in box       Knee/Hip Exercises: Aerobic   Nustep L6 LE only   10 min      Knee/Hip Exercises: Seated   Long Arc Quad Right;Left;5 sets;10 reps    Long Arc Quad Weight 7 lbs.                    PT Short Term Goals - 03/27/20 1255      PT SHORT TERM GOAL #1   Title Pt and caregiver will be independent with HEP    Status Achieved      PT SHORT TERM GOAL #2   Title Pt will be able to perform 5x sit<>stand from w/c with min A    Status  Achieved      PT SHORT TERM GOAL #3   Title Pt will be able to perform 5 SLR with no lag    Baseline still a lag RT    Status On-going      PT SHORT TERM GOAL #4   Title Pt will be able to tolerate standing ~30 sec for grooming task    Baseline stood and ball toss without LOB    Status Achieved             PT Long Term Goals - 04/12/20 1243      PT LONG TERM GOAL #1   Title Pt will be independent with advanced HEP    Status On-going      PT LONG TERM GOAL #2   Title Pt will be able to amb 5' with RW min A to get into his bathroom      PT LONG TERM GOAL #3   Status Achieved      PT LONG TERM GOAL #4   Title Pt will have improved FOTO score to 55%    Baseline 39% limited    Status Achieved      PT LONG TERM GOAL #5   Title Pt will be able to perform stand pivot t/fs mod I with RW    Status Achieved      PT LONG TERM GOAL #6   Title 5x sit to stand in 20 seconds    Status Unable to assess      PT LONG TERM GOAL #7   Title Safely walk with [facility] in clinic and at home with brace 75% of time    Baseline wlking ini clinic and no device  with CGA. Still not safe to do independently    Status On-going      PT LONG TERM GOAL #8   Title He will walk into clinic with single point cane    Status Revised                 Plan - 04/12/20 1238    Clinical Impression Statement [name] is progressing with improved balance on feet  and tolerance walking with sPC. Marland Kitchen   He  continues with decr weight to RT  walking even with RM but was soother walking with SPC at end of session .  He continues with RT quad lag  but is able to tolerate 8-10 pounds in availabel active ROM.   He is still probably not safe towalk in and out of home with [facility] due to terminal ROM quad weakness. We use the brace when walkin locke but the brace slips down so he probably would get little support if RT knee buckles.    We did more functinal activity using no asssitive device.  He also fatigues quickly.  Overall good progress from where we were a month ago    PT Treatment/Interventions ADLs/Self Care Home Management;Aquatic Therapy;Cryotherapy;Electrical Stimulation;Iontophoresis 4mg /ml Dexamethasone;Moist Heat;Ultrasound;Gait training;Stair training;Functional mobility training;Therapeutic activities;Therapeutic exercise;Balance training;Neuromuscular re-education;Patient/family education;Orthotic Fit/Training;Manual techniques;Scar mobilization;Passive range of motion;Taping;Dry needling;Vasopneumatic Device    PT Next Visit Plan Continue working on walking with locked brace and decr device as able safely and continue to  sit to stand for general LE strength,  Functional tasks for strength and balance . Continue overall LE strengthening/balance    PT Home Exercise Plan Access Code [email or web address] ( QS , SAQ, SLR and heel slides with strap, march)  LAQ,  Wall bumps    Consulted and Agree with  Plan of Care Patient           Patient will benefit from skilled therapeutic intervention in order to improve the following deficits and impairments:  Abnormal gait, Decreased  balance, Decreased mobility, Difficulty walking, Hypomobility, Decreased range of motion, Improper body mechanics, Obesity, Decreased activity tolerance, Decreased strength, Increased fascial restricitons, Impaired flexibility  Visit Diagnosis: Muscle weakness (generalized)  Rupture of left quadriceps tendon, initial encounter  Rupture of patellar tendon, right, initial encounter  Other abnormalities of gait and mobility     Problem List Patient Active Problem List   Diagnosis Date Noted  . Hypertension 10/20/2019  . Sleep apnea 10/20/2019  . Atrial fibrillation (HCC) 10/20/2019  . Knee pain 10/20/2019  . Diabetes mellitus without complication (HCC)   . CHF (congestive heart failure) (HCC)   . Gout   . Rupture of left quadriceps tendon   . Rupture of patellar tendon, right, initial encounter   . ATN (acute tubular necrosis) [facility])     Caprice Red  PT 04/12/2020, 12:50 PM  [facility] [address] [place], [place], [address] Phone: [phone]   Fax:  [phone]  Name: Douglas [name] MRN: [id number] Date of Birth: 1971-06-15

## 2020-04-13 ENCOUNTER — Ambulatory Visit (INDEPENDENT_AMBULATORY_CARE_PROVIDER_SITE_OTHER): Payer: Medicare Other | Admitting: Orthopedic Surgery

## 2020-04-13 ENCOUNTER — Encounter: Payer: Self-pay | Admitting: Orthopedic Surgery

## 2020-04-13 DIAGNOSIS — S86811A Strain of other muscle(s) and tendon(s) at lower leg level, right leg, initial encounter: Secondary | ICD-10-CM

## 2020-04-13 DIAGNOSIS — S76112A Strain of left quadriceps muscle, fascia and tendon, initial encounter: Secondary | ICD-10-CM

## 2020-04-13 DIAGNOSIS — Z7689 Persons encountering health services in other specified circumstances: Secondary | ICD-10-CM | POA: Diagnosis not present

## 2020-04-13 NOTE — Progress Notes (Signed)
Office Visit Note   Patient: Douglas [name]           Date of Birth: 03-19-1971           MRN: [id number] Visit Date: 04/13/2020 Requested by: Toma Deiters, MD [address] [place],  [place] [address] PCP: Toma Deiters, MD  Subjective: Chief Complaint  Patient presents with  . Right Leg - Routine Post Op  . Left Leg - Routine Post Op    HPI: Douglas [name] is a 49 year old patient who is now 5 months out left quad tendon repair and right patellar tendon rupture repair.  He is doing well.  Physical therapy is going well.  He is getting stronger.  Using a walker and a cane.  He is able to do his activities of daily living.  In therapy 2 times a week.              ROS: All systems reviewed are negative as they relate to the chief complaint within the history of present illness.  Patient denies  fevers or chills.   Assessment & Plan: Visit Diagnoses:  1. Rupture of left quadriceps tendon, initial encounter   2. Rupture of patellar tendon, right, initial encounter     Plan: Impression is left quad tendon rupture repair doing well.  Right patellar tendon rupture also doing well but he has about a 15 degree extensor lag.  Flexion is past 90 easily.  Quad strength and knee extension strength is 5 out of 5 bilaterally.  Plan at this time is continue therapy for strengthening particularly on that right-hand side 1-2 times a week for another 6 to 8 weeks and then discharged to home exercise program.  Follow-up with me as needed.  Follow-Up Instructions: Return if symptoms worsen or fail to improve.   Orders:  No orders of the defined types were placed in this encounter.  No orders of the defined types were placed in this encounter.     Procedures: No procedures performed   Clinical Data: No additional findings.  Objective: Vital Signs: There were no vitals taken for this visit.  Physical Exam:   Constitutional: Patient appears well-developed HEENT:  Head:  Normocephalic Eyes:EOM are normal Neck: Normal range of motion Cardiovascular: Normal rate Pulmonary/chest: Effort normal Neurologic: Patient is alert Skin: Skin is warm Psychiatric: Patient has normal mood and affect    Ortho Exam: Ortho exam demonstrates 15 degree extensor lag on the right but good quad strength and patency of the repair when palpating from the tibial tubercle to the inferior pole of the patella.  No knee effusion.  Patella has very little mobility when patient does a straight leg raise.  On the left-hand side he has excellent range of motion lacking only by 5 degrees of full extension with flexion past 90 and no effusion.  No calf tenderness.  No Homans' sign bilaterally.  Specialty Comments:  No specialty comments available.  Imaging: No results found.   PMFS History: Patient Active Problem List   Diagnosis Date Noted  . Hypertension 10/20/2019  . Sleep apnea 10/20/2019  . Atrial fibrillation (HCC) 10/20/2019  . Knee pain 10/20/2019  . Diabetes mellitus without complication (HCC)   . CHF (congestive heart failure) (HCC)   . Gout   . Rupture of left quadriceps tendon   . Rupture of patellar tendon, right, initial encounter   . ATN (acute tubular necrosis) (HCC)    Past Medical History:  Diagnosis Date  .  CHF (congestive heart failure) (HCC)   . Degenerative arthritis   . Depression   . Diabetes mellitus without complication (HCC)   . GERD (gastroesophageal reflux disease)   . Gout   . HLD (hyperlipidemia)   . Hypertension   . Seasonal allergies     History reviewed. No pertinent family history.  Past Surgical History:  Procedure Laterality Date  . KNEE ARTHROSCOPY Left 11/01/2019   Procedure: ARTHROSCOPY KNEE AND MENISCUS DEBRIDEMENT;  Surgeon: Cammy Copa, MD;  Location: [facility] OR;  Service: Orthopedics;  Laterality: Left;  . KNEE ARTHROSCOPY WITH PATELLAR TENDON REPAIR Right 11/01/2019   Procedure: KNEE ARTHROSCOPY WITH PATELLAR TENDON  REPAIR;  Surgeon: Cammy Copa, MD;  Location: [facility] OR;  Service: Orthopedics;  Laterality: Right;  . TENDON REPAIR Left 11/01/2019   Procedure: QUADRICEP TENDON REPAIR;  Surgeon: Cammy Copa, MD;  Location: [facility] OR;  Service: Orthopedics;  Laterality: Left;   Social History   Occupational History  . Occupation: disabled  Tobacco Use  . Smoking status: Former Smoker    Quit date: 06/16/2014    Years since quitting: 5.8  . Smokeless tobacco: Never Used  Vaping Use  . Vaping Use: Never used  Substance and Sexual Activity  . Alcohol use: Not Currently  . Drug use: Never  . Sexual activity: Not Currently

## 2020-04-17 ENCOUNTER — Ambulatory Visit: Payer: Medicare Other

## 2020-04-17 ENCOUNTER — Telehealth: Payer: Self-pay | Admitting: Physical Therapy

## 2020-04-17 NOTE — Telephone Encounter (Signed)
Spoke to Douglas [name] and offered Friday at 1PM as option for appointment canceled today. He will check on a ride.

## 2020-04-17 NOTE — Telephone Encounter (Signed)
Spoke to Mr Blazier about changing time for PT  Due to clinician appointment conflict. He will check if they can change time.

## 2020-04-19 ENCOUNTER — Other Ambulatory Visit: Payer: Self-pay

## 2020-04-19 ENCOUNTER — Ambulatory Visit: Payer: Medicare Other | Attending: Orthopedic Surgery

## 2020-04-19 DIAGNOSIS — S86811A Strain of other muscle(s) and tendon(s) at lower leg level, right leg, initial encounter: Secondary | ICD-10-CM | POA: Diagnosis present

## 2020-04-19 DIAGNOSIS — R2689 Other abnormalities of gait and mobility: Secondary | ICD-10-CM | POA: Insufficient documentation

## 2020-04-19 DIAGNOSIS — M25662 Stiffness of left knee, not elsewhere classified: Secondary | ICD-10-CM | POA: Insufficient documentation

## 2020-04-19 DIAGNOSIS — M6281 Muscle weakness (generalized): Secondary | ICD-10-CM | POA: Diagnosis present

## 2020-04-19 DIAGNOSIS — S76112A Strain of left quadriceps muscle, fascia and tendon, initial encounter: Secondary | ICD-10-CM | POA: Diagnosis present

## 2020-04-19 DIAGNOSIS — M25661 Stiffness of right knee, not elsewhere classified: Secondary | ICD-10-CM | POA: Diagnosis present

## 2020-04-19 NOTE — Addendum Note (Signed)
Addended by: Caprice Red on: 04/19/2020 03:56 PM   Modules accepted: Orders

## 2020-04-19 NOTE — Therapy (Addendum)
[place], [place], [address] Phone: [phone]   Fax:  [phone]  Physical Therapy Treatment  Patient Details  Name: Douglas [name] MRN: [id number] Date of Birth: 09/19/1970 Referring Provider (PT): Alphonzo Severance  Progress Note Reporting Period 03/29/20 to 04/19/20  See note below for Objective Data and Assessment of Progress/Goals.      Encounter Date: 04/19/2020   PT End of Session - 04/19/20 1534    Visit Number 18    Number of Visits 28    Date for PT Re-Evaluation 06/01/20   Authorization Type UHC MCR    Authorization Time Period FOTO visit #18   KX visit 15    Progress Note Due on Visit 28   PT Start Time 0240    PT Stop Time 0330    PT Time Calculation (min) 50 min    Activity Tolerance Patient tolerated treatment well    Behavior During Therapy WFL for tasks assessed/performed           Past Medical History:  Diagnosis Date  . CHF (congestive heart failure) ([place])   . Degenerative arthritis   . Depression   . Diabetes mellitus without complication ([place])   . GERD (gastroesophageal reflux disease)   . Gout   . HLD (hyperlipidemia)   . Hypertension   . Seasonal allergies     Past Surgical History:  Procedure Laterality Date  . KNEE ARTHROSCOPY Left 11/01/2019   Procedure: ARTHROSCOPY KNEE AND MENISCUS DEBRIDEMENT;  Surgeon: Meredith Pel, MD;  Location: [place];  Service: Orthopedics;  Laterality: Left;  . KNEE ARTHROSCOPY WITH PATELLAR TENDON REPAIR Right 11/01/2019   Procedure: KNEE ARTHROSCOPY WITH PATELLAR TENDON REPAIR;  Surgeon: Meredith Pel, MD;  Location: [place];  Service: Orthopedics;  Laterality: Right;  . TENDON REPAIR Left 11/01/2019   Procedure: QUADRICEP TENDON REPAIR;  Surgeon: Meredith Pel, MD;  Location: [place];  Service: Orthopedics;  Laterality: Left;    There were no vitals filed for this visit.   Subjective Assessment - 04/19/20 1434     Subjective Md pleased and he is released for his care. Now with [facility] and no brace.    Currently in Pain? No/denies                             [facility] Adult PT Treatment/Exercise - 04/19/20 0001      Ambulation/Gait   Assistive device Large base quad cane    Gait Pattern Step-through pattern    Stairs Yes    Stairs Assistance 4: Min guard    Stairs Assistance Details (indicate cue type and reason) 2 rail LT foot providing support. RT knee buckles when attempted staep wih RT leg    Stair Management Technique Two rails;Step to pattern    Number of Stairs 8    Height of Stairs 6      Knee/Hip Exercises: Standing   Lateral Step Up Right;Left;15 reps;Hand Hold: 2;Step Height: 4"    Lateral Step Up Limitations CGA    Forward Step Up Right;Left;15 reps;Step Height: 4";Hand Hold: 2    Forward Step Up Limitations CGA    Wall Squat 15 reps;3 seconds    Other Standing Knee Exercises marching in place no UE support CGA of 1.   then walked to front no device.  CGA of 1.     Other Standing Knee Exercises wall bumps with cues to squeeze  gluts to move hips forward x 25.                     PT Short Term Goals - 03/27/20 1255      PT SHORT TERM GOAL #1   Title Pt and caregiver will be independent with HEP    Status Achieved      PT SHORT TERM GOAL #2   Title Pt will be able to perform 5x sit<>stand from w/c with min A    Status Achieved      PT SHORT TERM GOAL #3   Title Pt will be able to perform 5 SLR with no lag    Baseline still a lag RT    Status On-going      PT SHORT TERM GOAL #4   Title Pt will be able to tolerate standing ~30 sec for grooming task    Baseline stood and ball toss without LOB    Status Achieved             PT Long Term Goals - 04/19/20 1540      PT LONG TERM GOAL #1   Title Pt will be independent with advanced HEP    Status On-going      PT LONG TERM GOAL #4   Title Pt will have improved FOTO score to 55%    Status  Unable to assess      PT LONG TERM GOAL #7   Title Safely walk with SPC in clinic and at home with brace 75% of time    Baseline LBQC and  no brace    Status Achieved      PT LONG TERM GOAL #8   Title He will walk into clinic with single point cane    Baseline LBQC    Status Partially Met      PT LONG TERM GOAL  #9   TITLE He will be able to walk stairs step over step with 2 rails    Baseline unable    Time 6    Period Weeks    Status New      PT LONG TERM GOAL  #10   TITLE He will be able to walk safelyin home no device 75-50% orf the time    Baseline LBQC    Time 6    Period Weeks    Status New      PT LONG TERM GOAL  #11   TITLE He will be able to extend RT knee with  -5 degrees active. to improve stability of RT knee for stairs and walking without device    Time 6    Period Weeks    Status New                 Plan - 04/19/20 1445    Clinical Impression Statement Douglas [name] arrived with no brace. Cane adjusted in height and side ( now using on LT  for RT leg support)   MD note stated he would like 6-8 more weeks so will schedule 4 more at this time. 2x/week.  He walks well with quad cane.  RT leg lag and weakness made stpping up non step unsafe . His knee buckles with attempt on 6 inch step. Still needs to use LT leg for safety on stairs. He had difficulty dointhe terminal part of a wall slide.    Stability/Clinical Decision Making Evolving/Moderate complexity    Rehab Potential Good    PT Frequency  2x / week    PT Duration 6 weeks    PT Treatment/Interventions ADLs/Self Care Home Management;Aquatic Therapy;Cryotherapy;Electrical Stimulation;Iontophoresis 38m/ml Dexamethasone;Moist Heat;Ultrasound;Gait training;Stair training;Functional mobility training;Therapeutic activities;Therapeutic exercise;Balance training;Neuromuscular re-education;Patient/family education;Orthotic Fit/Training;Manual techniques;Scar mobilization;Passive range of motion;Taping;Dry  needling;Vasopneumatic Device    PT Next Visit Plan Work on strength /balance and gait                                               FOTO                                    5xSTS                     tape for quad faccilitaiton , patella position    PT Home Exercise Plan Access Code [email or web address] ( QS , SAQ, SLR and heel slides with strap, march)  LAQ,  Wall bumps    Consulted and Agree with Plan of Care Patient           Patient will benefit from skilled therapeutic intervention in order to improve the following deficits and impairments:  Abnormal gait, Decreased balance, Decreased mobility, Difficulty walking, Hypomobility, Decreased range of motion, Improper body mechanics, Obesity, Decreased activity tolerance, Decreased strength, Increased fascial restricitons, Impaired flexibility  Visit Diagnosis: Muscle weakness (generalized)  Rupture of left quadriceps tendon, initial encounter  Rupture of patellar tendon, right, initial encounter  Other abnormalities of gait and mobility     Problem List Patient Active Problem List   Diagnosis Date Noted  . Hypertension 10/20/2019  . Sleep apnea 10/20/2019  . Atrial fibrillation ([place] 10/20/2019  . Knee pain 10/20/2019  . Diabetes mellitus without complication ([place]   . CHF (congestive heart failure) ([place]   . Gout   . Rupture of left quadriceps tendon   . Rupture of patellar tendon, right, initial encounter   . ATN (acute tubular necrosis) ([facility]     CDarrel [name] PT 04/19/2020, 3:49 PM  [facility] [address]Phone: [phone]  Fax:  [phone] Name: Douglas ThammavongMRN: [id number] of Birth: 9Jul 12, 1972

## 2020-04-24 ENCOUNTER — Ambulatory Visit: Payer: Medicare Other

## 2020-05-01 ENCOUNTER — Ambulatory Visit: Payer: Medicare Other

## 2020-05-01 ENCOUNTER — Other Ambulatory Visit: Payer: Self-pay

## 2020-05-01 DIAGNOSIS — S86811A Strain of other muscle(s) and tendon(s) at lower leg level, right leg, initial encounter: Secondary | ICD-10-CM

## 2020-05-01 DIAGNOSIS — R2689 Other abnormalities of gait and mobility: Secondary | ICD-10-CM

## 2020-05-01 DIAGNOSIS — M6281 Muscle weakness (generalized): Secondary | ICD-10-CM

## 2020-05-01 DIAGNOSIS — S76112A Strain of left quadriceps muscle, fascia and tendon, initial encounter: Secondary | ICD-10-CM

## 2020-05-01 NOTE — Therapy (Signed)
[place], [place], [address] Phone: [phone]   Fax:  [phone]  Physical Therapy Treatment  Patient Details  Name: Douglas [name] MRN: [id number] Date of Birth: May 15, 1971 Referring Provider (PT): Alphonzo Severance   Encounter Date: 05/01/2020   PT End of Session - 05/01/20 1450    Visit Number 19    Number of Visits 28    Date for PT Re-Evaluation 06/29/20    Authorization Type UHC MCR    Authorization Time Period FOTO visit #18   KX visit 15    Progress Note Due on Visit 20    PT Start Time 1445    PT Stop Time 1524    PT Time Calculation (min) 39 min    Activity Tolerance Patient tolerated treatment well    Behavior During Therapy WFL for tasks assessed/performed           Past Medical History:  Diagnosis Date   CHF (congestive heart failure) (HCC)    Degenerative arthritis    Depression    Diabetes mellitus without complication (HCC)    GERD (gastroesophageal reflux disease)    Gout    HLD (hyperlipidemia)    Hypertension    Seasonal allergies     Past Surgical History:  Procedure Laterality Date   KNEE ARTHROSCOPY Left 11/01/2019   Procedure: ARTHROSCOPY KNEE AND MENISCUS DEBRIDEMENT;  Surgeon: Meredith Pel, MD;  Location: [place];  Service: Orthopedics;  Laterality: Left;   KNEE ARTHROSCOPY WITH PATELLAR TENDON REPAIR Right 11/01/2019   Procedure: KNEE ARTHROSCOPY WITH PATELLAR TENDON REPAIR;  Surgeon: Meredith Pel, MD;  Location: [place];  Service: Orthopedics;  Laterality: Right;   TENDON REPAIR Left 11/01/2019   Procedure: QUADRICEP TENDON REPAIR;  Surgeon: Meredith Pel, MD;  Location: [place];  Service: Orthopedics;  Laterality: Left;    There were no vitals filed for this visit.   Subjective Assessment - 05/01/20 1451    Subjective "I feel really good. Just a little stiffness from the cold but that's it."    How long can you stand comfortably?  ~20 sec    How long can you walk comfortably? About 1 to 2 feet    Patient Stated Goals Improve walking    Currently in Pain? No/denies              [facility] PT Assessment - 05/01/20 0001      Assessment   Medical Diagnosis S76.11 Rupture of L quad, S86.811 Rupture of R patellar tendon    Referring Provider (PT) Alphonzo Severance      Observation/Other Assessments   Focus on Therapeutic Outcomes (FOTO)  31% limited      Transfers   Five time sit to stand comments  24.85 seconds; initial use of BUE for sit>stand                         [facility] Adult PT Treatment/Exercise - 05/01/20 0001      Ambulation/Gait   Assistive device Large base quad cane    Gait Pattern Step-through pattern      Therapeutic Activites    Therapeutic Activities Other Therapeutic Activities    Other Therapeutic Activities pushing work sled 60# x 60 feet       Knee/Hip Exercises: Aerobic   Nustep L6 x 6 min      Knee/Hip Exercises: Standing   Heel Raises Both;20 reps    Lateral Step Up --  Lateral Step Up Limitations --    Forward Step Up Both;15 reps;Step Height: 4"    Forward Step Up Limitations CGA    Step Down Right;10 reps;Step Height: 4"    Step Down Limitations Attempted both initially - pt demonstrated increased difficulty and decreased balance stepping down with LLE first    Wall Squat 20 reps;3 seconds    Other Standing Knee Exercises Alternating marches 30x (15x each leg) without UE support                  PT Education - 05/01/20 1547    Education Details Reviewed updated FOTO score and progress thus far with skilled PT.    Person(s) Educated Patient    Methods Explanation;Other (comment)   visual of score report   Comprehension Verbalized understanding            PT Short Term Goals - 03/27/20 1255      PT SHORT TERM GOAL #1   Title Pt and caregiver will be independent with HEP    Status Achieved      PT SHORT TERM GOAL #2   Title Pt will be able to  perform 5x sit<>stand from w/c with min A    Status Achieved      PT SHORT TERM GOAL #3   Title Pt will be able to perform 5 SLR with no lag    Baseline still a lag RT    Status On-going      PT SHORT TERM GOAL #4   Title Pt will be able to tolerate standing ~30 sec for grooming task    Baseline stood and ball toss without LOB    Status Achieved             PT Long Term Goals - 04/19/20 1540      PT LONG TERM GOAL #1   Title Pt will be independent with advanced HEP    Status On-going      PT LONG TERM GOAL #4   Title Pt will have improved FOTO score to 55%    Status Unable to assess      PT LONG TERM GOAL #7   Title Safely walk with SPC in clinic and at home with brace 75% of time    Baseline LBQC and  no brace    Status Achieved      PT LONG TERM GOAL #8   Title He will walk into clinic with single point cane    Baseline LBQC    Status Partially Met      PT LONG TERM GOAL  #9   TITLE He will be able to walk stairs step over step with 2 rails    Baseline unable    Time 6    Period Weeks    Status New      PT LONG TERM GOAL  #10   TITLE He will be able to walk safelyin home no device 75-50% orf the time    Baseline LBQC    Time 6    Period Weeks    Status New      PT LONG TERM GOAL  #11   TITLE He will be able to extend RT knee with  -5 degrees active. to improve stability of RT knee for stairs and walking without device    Time 6    Period Weeks    Status New  Plan - 05/01/20 1500    Clinical Impression Statement Pt presents to PT using [facility] and step through gait pattern. Pt did well stepping up onto 4" step with RLE first but did not attempt 6" step today. Patient's FOTO score improved from 39% limitation to 31% limitation.    Personal Factors and Comorbidities Comorbidity 1;Fitness    Comorbidities obesity    Examination-Activity Limitations Bathing;Dressing;Transfers;Bed Mobility;Hygiene/Grooming;Bend;Lift;Squat;Locomotion  Level;Caring for Others;Stairs;Carry;Stand;Toileting    Examination-Participation Restrictions Community Activity;Laundry;Driving;Cleaning;Church;Yard Work;Shop;Meal Prep    Stability/Clinical Decision Making Evolving/Moderate complexity    Rehab Potential Good    PT Frequency 2x / week    PT Duration 6 weeks    PT Treatment/Interventions ADLs/Self Care Home Management;Aquatic Therapy;Cryotherapy;Electrical Stimulation;Iontophoresis 12m/ml Dexamethasone;Moist Heat;Ultrasound;Gait training;Stair training;Functional mobility training;Therapeutic activities;Therapeutic exercise;Balance training;Neuromuscular re-education;Patient/family education;Orthotic Fit/Training;Manual techniques;Scar mobilization;Passive range of motion;Taping;Dry needling;Vasopneumatic Device    PT Next Visit Plan Work on strength/balance and gait                   tape for quad facilitaiton , patella position    PT Home Exercise Plan Access Code [email or web address] (QS , SAQ, SLR and heel slides with strap, march)  LAQ,  Wall bumps    Consulted and Agree with Plan of Care Patient           Patient will benefit from skilled therapeutic intervention in order to improve the following deficits and impairments:  Abnormal gait, Decreased balance, Decreased mobility, Difficulty walking, Hypomobility, Decreased range of motion, Improper body mechanics, Obesity, Decreased activity tolerance, Decreased strength, Increased fascial restricitons, Impaired flexibility  Visit Diagnosis: Muscle weakness (generalized)  Rupture of left quadriceps tendon, initial encounter  Rupture of patellar tendon, right, initial encounter  Other abnormalities of gait and mobility     Problem List Patient Active Problem List   Diagnosis Date Noted   Hypertension 10/20/2019   Sleep apnea 10/20/2019   Atrial fibrillation ([place] 10/20/2019   Knee pain 10/20/2019   Diabetes mellitus without complication (HCC)    CHF (congestive heart failure) (HCC)      Gout    Rupture of left quadriceps tendon    Rupture of patellar tendon, right, initial encounter    AT[place] (acute tubular necrosis) ([place]     KHaydee Monica PT, DPT 05/01/20 3:51 PM  [facility]18[address]GRolling [address] [place] [address]Phone: [phone]  Fax:  [phone] Name: Douglas SacksMRN: [id number] of Birth: 909-Feb-1972

## 2020-05-03 ENCOUNTER — Other Ambulatory Visit: Payer: Self-pay

## 2020-05-03 ENCOUNTER — Ambulatory Visit: Payer: Medicare Other | Admitting: Physical Therapy

## 2020-05-03 ENCOUNTER — Encounter: Payer: Self-pay | Admitting: Physical Therapy

## 2020-05-03 DIAGNOSIS — S76112A Strain of left quadriceps muscle, fascia and tendon, initial encounter: Secondary | ICD-10-CM

## 2020-05-03 DIAGNOSIS — S86811A Strain of other muscle(s) and tendon(s) at lower leg level, right leg, initial encounter: Secondary | ICD-10-CM

## 2020-05-03 DIAGNOSIS — M6281 Muscle weakness (generalized): Secondary | ICD-10-CM

## 2020-05-03 DIAGNOSIS — M25661 Stiffness of right knee, not elsewhere classified: Secondary | ICD-10-CM

## 2020-05-03 DIAGNOSIS — R2689 Other abnormalities of gait and mobility: Secondary | ICD-10-CM

## 2020-05-03 DIAGNOSIS — M25662 Stiffness of left knee, not elsewhere classified: Secondary | ICD-10-CM

## 2020-05-03 NOTE — Therapy (Signed)
[place], [place], [address] Phone: [phone]   Fax:  [phone]  Physical Therapy Treatment  Patient Details  Name: Douglas [name] MRN: [id number] Date of Birth: [birth year] Referring Provider (PT): Alphonzo Severance   Encounter Date: 05/03/2020   PT End of Session - 05/03/20 1227    Visit Number 20    Number of Visits 28    Date for PT Re-Evaluation 06/29/20    Authorization Type UHC MCR    Authorization Time Period FOTO visit #18   KX visit 15    Progress Note Due on Visit 30    PT Start Time 1226    PT Stop Time 1310    PT Time Calculation (min) 44 min           Past Medical History:  Diagnosis Date  . CHF (congestive heart failure) ([place])   . Degenerative arthritis   . Depression   . Diabetes mellitus without complication ([place])   . GERD (gastroesophageal reflux disease)   . Gout   . HLD (hyperlipidemia)   . Hypertension   . Seasonal allergies     Past Surgical History:  Procedure Laterality Date  . KNEE ARTHROSCOPY Left 11/01/2019   Procedure: ARTHROSCOPY KNEE AND MENISCUS DEBRIDEMENT;  Surgeon: Meredith Pel, MD;  Location: [place];  Service: Orthopedics;  Laterality: Left;  . KNEE ARTHROSCOPY WITH PATELLAR TENDON REPAIR Right 11/01/2019   Procedure: KNEE ARTHROSCOPY WITH PATELLAR TENDON REPAIR;  Surgeon: Meredith Pel, MD;  Location: [place];  Service: Orthopedics;  Laterality: Right;  . TENDON REPAIR Left 11/01/2019   Procedure: QUADRICEP TENDON REPAIR;  Surgeon: Meredith Pel, MD;  Location: [place];  Service: Orthopedics;  Laterality: Left;    There were no vitals filed for this visit.   Subjective Assessment - 05/03/20 1305    Subjective Sore and achey from the weather.    Currently in Pain? No/denies              [facility] PT Assessment - 05/03/20 0001      AROM   Right Knee Extension -30   SLR                        [facility] Adult PT  Treatment/Exercise - 05/03/20 0001      Knee/Hip Exercises: Aerobic   Nustep L6 x 6 min      Knee/Hip Exercises: Standing   Heel Raises Both;20 reps    Heel Raises Limitations at free motion    Forward Step Up Right;10 reps;Hand Hold: 2;Step Height: 6"    Forward Step Up Limitations facing free motion- using free motion arms to assist with step up     Functional Squat Limitations standing partial squats at free motion       Knee/Hip Exercises: Seated   Long Arc Quad 10 reps    Long Arc Quad Weight 6 lbs.    Long [employer] Limitations  10 sec holds    Sit to [employer] 10 reps    5 x STS 30 sec without UE     Knee/Hip Exercises: Supine   Bridges 20 reps    Straight Leg Raises 20 reps    Straight Leg Raises Limitations -30 quad lag                     PT Short Term Goals - 03/27/20 1255      PT SHORT  TERM GOAL #1   Title Pt and caregiver will be independent with HEP    Status Achieved      PT SHORT TERM GOAL #2   Title Pt will be able to perform 5x sit<>stand from w/c with min A    Status Achieved      PT SHORT TERM GOAL #3   Title Pt will be able to perform 5 SLR with no lag    Baseline still a lag RT    Status On-going      PT SHORT TERM GOAL #4   Title Pt will be able to tolerate standing ~30 sec for grooming task    Baseline stood and ball toss without LOB    Status Achieved             PT Long Term Goals - 05/03/20 1308      PT LONG TERM GOAL #1   Title Pt will be independent with advanced HEP    Time 6    Period Weeks    Status On-going      PT LONG TERM GOAL #2   Title Pt will be able to amb 5' with RW min A to get into his bathroom    Time 6    Period Weeks    Status Achieved      PT LONG TERM GOAL #3   Title Pt will be able to perform 5x sit<>stand with SBA    Period Weeks    Status Achieved      PT LONG TERM GOAL #4   Title Pt will have improved FOTO score to 55% limitation    Baseline 31%    Time 6    Period Weeks    Status  Achieved      PT LONG TERM GOAL #5   Title Pt will be able to perform stand pivot t/fs mod I with RW    Time 6    Period Weeks    Status Achieved      PT LONG TERM GOAL #6   Title 5x sit to stand in 20 seconds    Baseline 30 seconds    Time 4    Period Weeks    Status On-going      PT LONG TERM GOAL #7   Title Safely walk with SPC in clinic and at home with brace 75% of time    Baseline LBQC and  no brace    Time 4    Period Weeks    Status Partially Met      PT LONG TERM GOAL #8   Title He will walk into clinic with single point cane    Baseline LBQC    Time 3    Period Weeks    Status Partially Met      PT LONG TERM GOAL  #9   TITLE He will be able to walk stairs step over step with 2 rails    Baseline unable    Time 6    Period Weeks    Status On-going      PT LONG TERM GOAL  #10   TITLE He will be able to walk safelyin home no device 75-50% orf the time    Baseline LBQC    Time 6    Period Weeks    Status On-going      PT LONG TERM GOAL  #11   TITLE He will be able to extend RT knee with  -5 degrees active.  to improve stability of RT knee for stairs and walking without device    Time 6    Period Weeks    Status On-going                 Plan - 05/03/20 1305    Clinical Impression Statement Pt arrives with [facility] and reports achiness and soreness due to the weather. Able to forward step up on 6 inch step with significant UE assist. 5 x STS 30 sec without UE. Significant quad lag still present with SLR, -30 degrees. He is ambulating with LBQC and step through pattern.    PT Next Visit Plan Work on strength/balance and gait         FOTO at [employer] for quad facilitaiton , patella position    PT Home Exercise Plan Access Code [email or web address] (QS , SAQ, SLR and heel slides with strap, march)  LAQ,  Wall bumps           Patient will benefit from skilled therapeutic intervention in order to improve the following deficits and impairments:  Abnormal gait, Decreased  balance, Decreased mobility, Difficulty walking, Hypomobility, Decreased range of motion, Improper body mechanics, Obesity, Decreased activity tolerance, Decreased strength, Increased fascial restricitons, Impaired flexibility  Visit Diagnosis: Muscle weakness (generalized)  Rupture of left quadriceps tendon, initial encounter  Rupture of patellar tendon, right, initial encounter  Other abnormalities of gait and mobility  Stiffness of right knee, not elsewhere classified  Stiffness of left knee, not elsewhere classified     Problem List Patient Active Problem List   Diagnosis Date Noted  . Hypertension 10/20/2019  . Sleep apnea 10/20/2019  . Atrial fibrillation ([place]) 10/20/2019  . Knee pain 10/20/2019  . Diabetes mellitus without complication ([place])   . CHF (congestive heart failure) ([place])   . Gout   . Rupture of left quadriceps tendon   . Rupture of patellar tendon, right, initial encounter   . ATN (acute tubular necrosis) [facility])     Dorene Ar, PTA 05/03/2020, 1:39 PM  [place], [place], [address] Phone: [phone]   Fax:  [phone]  Name: Douglas [name] MRN: [id number] Date of Birth: [birth year]

## 2020-05-08 ENCOUNTER — Ambulatory Visit: Payer: Medicare Other

## 2020-05-08 ENCOUNTER — Other Ambulatory Visit: Payer: Self-pay

## 2020-05-08 DIAGNOSIS — S76112A Strain of left quadriceps muscle, fascia and tendon, initial encounter: Secondary | ICD-10-CM

## 2020-05-08 DIAGNOSIS — M6281 Muscle weakness (generalized): Secondary | ICD-10-CM | POA: Diagnosis not present

## 2020-05-08 DIAGNOSIS — S86811A Strain of other muscle(s) and tendon(s) at lower leg level, right leg, initial encounter: Secondary | ICD-10-CM

## 2020-05-08 NOTE — Therapy (Signed)
[facility] [address] [place], [place], [address] Phone: [phone]   Fax:  [phone]  Physical Therapy Treatment  Patient Details  Name: Douglas [name] MRN: [id number] Date of Birth: 29-Jul-1970 Referring Provider (PT): Dorene Grebe   Encounter Date: 05/08/2020   PT End of Session - 05/08/20 0936    Visit Number 21    Number of Visits 28    Date for PT Re-Evaluation 06/29/20    Authorization Time Period FOTO visit #18   KX visit 15    Progress Note Due on Visit 28    PT Start Time 0940    PT Stop Time 1030    PT Time Calculation (min) 50 min    Activity Tolerance Patient tolerated treatment well    Behavior During Therapy [facility] for tasks assessed/performed           Past Medical History:  Diagnosis Date   CHF (congestive heart failure) (HCC)    Degenerative arthritis    Depression    Diabetes mellitus without complication (HCC)    GERD (gastroesophageal reflux disease)    Gout    HLD (hyperlipidemia)    Hypertension    Seasonal allergies     Past Surgical History:  Procedure Laterality Date   KNEE ARTHROSCOPY Left 11/01/2019   Procedure: ARTHROSCOPY KNEE AND MENISCUS DEBRIDEMENT;  Surgeon: Cammy Copa, MD;  [facility];  Service: Orthopedics;  Laterality: Left;   KNEE ARTHROSCOPY WITH PATELLAR TENDON REPAIR Right 11/01/2019   Procedure: KNEE ARTHROSCOPY WITH PATELLAR TENDON REPAIR;  Surgeon: Cammy Copa, MD;  [facility];  Service: Orthopedics;  Laterality: Right;   TENDON REPAIR Left 11/01/2019   Procedure: QUADRICEP TENDON REPAIR;  Surgeon: Cammy Copa, MD;  Location: [facility];  Service: Orthopedics;  Laterality: Left;    There were no vitals filed for this visit.   Subjective Assessment - 05/08/20 0945    Subjective Sore and achey from the cold weather. Both knees.    Currently in Pain? No/denies                             [facility] Adult  PT Treatment/Exercise - 05/08/20 0001      Ambulation/Gait   Assistive device None    Gait Pattern Step-through pattern   but short stride    Stair Management Technique Two rails;Step to pattern    Number of Stairs 12    Height of Stairs 4    Gait Comments worked in bars on stride length   also worked on RT leg lifting weight to next step       Neuro Re-ed    Neuro Re-ed Details  ingle leg balance in bars LT leg no UE support assist to keep weight to Lt  and RT with LT hand on bars.       Knee/Hip Exercises: Aerobic   Nustep L6 x 10 min          Wall slides x 20 , LAQ AND  saq FACILITATION OG rt QUAD.           PT Short Term Goals - 03/27/20 1255      PT SHORT TERM GOAL #1   Title Pt and caregiver will be independent with HEP    Status Achieved      PT SHORT TERM GOAL #2   Title Pt will be able to perform 5x sit<>stand from w/c with  min A    Status Achieved      PT SHORT TERM GOAL #3   Title Pt will be able to perform 5 SLR with no lag    Baseline still a lag RT    Status On-going      PT SHORT TERM GOAL #4   Title Pt will be able to tolerate standing ~30 sec for grooming task    Baseline stood and ball toss without LOB    Status Achieved             PT Long Term Goals - 05/08/20 1020      PT LONG TERM GOAL #1   Title Pt will be independent with advanced HEP    Status On-going                 Plan - 05/08/20 0944    Clinical Impression Statement He reports improvement with ability to sweep , do dishes and general activity like moving furniture short distances Also able to manage 2- step to carport with greater ease.. Does not do indoor steps. He reports no intention to do this in future.   Overall he is muchimproved  but Quad lag on RT makes walking without device increased risk of fall. will plan on finishing final 7 visits per POC then discharge.   Apparently per pt quad lag on RT is expected per MD    PT Treatment/Interventions ADLs/Self Care  Home Management;Aquatic Therapy;Cryotherapy;Electrical Stimulation;Iontophoresis 4mg /ml Dexamethasone;Moist Heat;Ultrasound;Gait training;Stair training;Functional mobility training;Therapeutic activities;Therapeutic exercise;Balance training;Neuromuscular re-education;Patient/family education;Orthotic Fit/Training;Manual techniques;Scar mobilization;Passive range of motion;Taping;Dry needling;Vasopneumatic Device    PT Next Visit Plan Work on strength/balance and gait         FOTO at DC    PT Home Exercise Plan Access Code [email or web address] (QS , SAQ, SLR and heel slides with strap, march)  LAQ,  Wall bumps    Consulted and Agree with Plan of Care Patient           Patient will benefit from skilled therapeutic intervention in order to improve the following deficits and impairments:  Abnormal gait, Decreased balance, Decreased mobility, Difficulty walking, Hypomobility, Decreased range of motion, Improper body mechanics, Obesity, Decreased activity tolerance, Decreased strength, Increased fascial restricitons, Impaired flexibility  Visit Diagnosis: Muscle weakness (generalized)  Rupture of left quadriceps tendon, initial encounter  Rupture of patellar tendon, right, initial encounter     Problem List Patient Active Problem List   Diagnosis Date Noted   Hypertension 10/20/2019   Sleep apnea 10/20/2019   Atrial fibrillation (HCC) 10/20/2019   Knee pain 10/20/2019   Diabetes mellitus without complication (HCC)    CHF (congestive heart failure) (HCC)    Gout    Rupture of left quadriceps tendon    Rupture of patellar tendon, right, initial encounter    ATN (acute tubular necrosis) (HCC)     12/20/2019  PT 05/08/2020, 10:41 AM  [facility] [address] [place], [place], [place] Phone: [phone]   Fax:  [phone]  Name: Douglas [name] MRN: [name] Date of Birth: 1971/02/18

## 2020-05-15 ENCOUNTER — Other Ambulatory Visit: Payer: Self-pay

## 2020-05-15 ENCOUNTER — Ambulatory Visit: Payer: Medicare Other

## 2020-05-15 DIAGNOSIS — M6281 Muscle weakness (generalized): Secondary | ICD-10-CM | POA: Diagnosis not present

## 2020-05-15 DIAGNOSIS — R2689 Other abnormalities of gait and mobility: Secondary | ICD-10-CM

## 2020-05-15 DIAGNOSIS — S86811A Strain of other muscle(s) and tendon(s) at lower leg level, right leg, initial encounter: Secondary | ICD-10-CM

## 2020-05-15 DIAGNOSIS — M25662 Stiffness of left knee, not elsewhere classified: Secondary | ICD-10-CM

## 2020-05-15 DIAGNOSIS — M25661 Stiffness of right knee, not elsewhere classified: Secondary | ICD-10-CM

## 2020-05-15 DIAGNOSIS — S76112A Strain of left quadriceps muscle, fascia and tendon, initial encounter: Secondary | ICD-10-CM

## 2020-05-15 NOTE — Therapy (Signed)
[place], [place], [address] Phone: [phone]   Fax:  [phone]  Physical Therapy Treatment  Patient Details  Name: Douglas [name] MRN: [id number] Date of Birth: 07-Sep-1970 Referring Provider (PT): Alphonzo Severance   Encounter Date: 05/15/2020   PT End of Session - 05/15/20 1040    Visit Number 22    Number of Visits 28    Date for PT Re-Evaluation 06/29/20    Authorization Type UHC MCR    Authorization Time Period FOTO visit #18   KX visit 15    Progress Note Due on Visit 28    PT Start Time 1040    PT Stop Time 1122    PT Time Calculation (min) 42 min    Activity Tolerance Patient tolerated treatment well    Behavior During Therapy [facility] for tasks assessed/performed           Past Medical History:  Diagnosis Date  . CHF (congestive heart failure) ([place])   . Degenerative arthritis   . Depression   . Diabetes mellitus without complication ([place])   . GERD (gastroesophageal reflux disease)   . Gout   . HLD (hyperlipidemia)   . Hypertension   . Seasonal allergies     Past Surgical History:  Procedure Laterality Date  . KNEE ARTHROSCOPY Left 11/01/2019   Procedure: ARTHROSCOPY KNEE AND MENISCUS DEBRIDEMENT;  Surgeon: Meredith Pel, MD;  Location: [place];  Service: Orthopedics;  Laterality: Left;  . KNEE ARTHROSCOPY WITH PATELLAR TENDON REPAIR Right 11/01/2019   Procedure: KNEE ARTHROSCOPY WITH PATELLAR TENDON REPAIR;  Surgeon: Meredith Pel, MD;  Location: [place];  Service: Orthopedics;  Laterality: Right;  . TENDON REPAIR Left 11/01/2019   Procedure: QUADRICEP TENDON REPAIR;  Surgeon: Meredith Pel, MD;  Location: [place];  Service: Orthopedics;  Laterality: Left;    There were no vitals filed for this visit.   Subjective Assessment - 05/15/20 1040    Subjective Patient reports feeling sore in B knees due to the cold weather.    Patient is accompained by: Family member     Limitations Standing;Walking;House hold activities    How long can you stand comfortably? ~20 sec    How long can you walk comfortably? About 1 to 2 feet    Patient Stated Goals Improve walking    Currently in Pain? No/denies              [facility] PT Assessment - 05/15/20 0001      Assessment   Medical Diagnosis S76.11 Rupture of L quad, S86.811 Rupture of R patellar tendon    Referring Provider (PT) Alphonzo Severance                         [facility] Adult PT Treatment/Exercise - 05/15/20 0001      Transfers   Five time sit to stand comments  25.57 seconds      Knee/Hip Exercises: Aerobic   Nustep L6 x 10      Knee/Hip Exercises: Standing   Heel Raises Both;20 reps    Heel Raises Limitations BUE support at freemotion    Forward Step Up Both;20 reps;Step Height: 6"    Forward Step Up Limitations 10x ascending with RLE and descending with LLE; 10x ascending with LLE and descending with RLE; BUE support at freemotion    Functional Squat 20 reps    Functional Squat Limitations Mini squats at freemotion  SLS 2 x 30 sec each LE with BUE support as needed to maintain balance      Knee/Hip Exercises: Seated   Long Arc Quad AROM;Strengthening;Both;20 reps;Weights    Long Arc Quad Weight 6 lbs.    Long [employer] Limitations 5 second holds at end range knee extension    Sit to Sand 5 reps;without UE support;Other (comment)   5xSTS                   PT Short Term Goals - 03/27/20 1255      PT SHORT TERM GOAL #1   Title Pt and caregiver will be independent with HEP    Status Achieved      PT SHORT TERM GOAL #2   Title Pt will be able to perform 5x sit<>stand from w/c with min A    Status Achieved      PT SHORT TERM GOAL #3   Title Pt will be able to perform 5 SLR with no lag    Baseline still a lag RT    Status On-going      PT SHORT TERM GOAL #4   Title Pt will be able to tolerate standing ~30 sec for grooming task    Baseline stood and ball toss without  LOB    Status Achieved             PT Long Term Goals - 05/15/20 1114      PT LONG TERM GOAL #1   Title Pt will be independent with advanced HEP    Time 6    Period Weeks    Status On-going      PT LONG TERM GOAL #2   Title Pt will be able to amb 5' with RW min A to get into his bathroom    Time 6    Period Weeks    Status Achieved      PT LONG TERM GOAL #3   Title Pt will be able to perform 5x sit<>stand with SBA    Period Weeks    Status Achieved      PT LONG TERM GOAL #4   Title Pt will have improved FOTO score to 55% limitation    Baseline 31%    Time 6    Period Weeks    Status Achieved      PT LONG TERM GOAL #5   Title Pt will be able to perform stand pivot t/fs mod I with RW    Time 6    Period Weeks    Status Achieved      PT LONG TERM GOAL #6   Title 5x sit to stand in 20 seconds    Baseline 30 seconds; update 05/15/2020: 25.57 sec    Time 4    Period Weeks    Status On-going      PT LONG TERM GOAL #7   Title Safely walk with SPC in clinic and at home with brace 75% of time    Baseline LBQC and  no brace    Time 4    Period Weeks    Status Partially Met      PT LONG TERM GOAL #8   Title He will walk into clinic with single point cane    Baseline LBQC    Time 3    Period Weeks    Status Partially Met      PT LONG TERM GOAL  #9   TITLE He will be able  to walk stairs step over step with 2 rails    Baseline unable    Time 6    Period Weeks    Status On-going      PT LONG TERM GOAL  #10   TITLE He will be able to walk safelyin home no device 75-50% orf the time    Baseline LBQC    Time 6    Period Weeks    Status On-going      PT LONG TERM GOAL  #11   TITLE He will be able to extend RT knee with  -5 degrees active. to improve stability of RT knee for stairs and walking without device    Baseline -6 in supine    Time 6    Period Weeks    Status On-going                 Plan - 05/15/20 1127    Clinical Impression Statement  Patient tolerated interventions well but demonstrates increased difficulty performing step ups leading with RLE compared to when leading with LLE. Pt reports continued ability to perform household activities and states he takes seated rest breaks as needed when performing tasks that require prolonged standing. Pt reiterates that R quad leg is expected per what he discussed with MD.    Personal Factors and Comorbidities Comorbidity 1;Fitness    Comorbidities obesity    Examination-Activity Limitations Bathing;Dressing;Transfers;Bed Mobility;Hygiene/Grooming;Bend;Lift;Squat;Locomotion Level;Caring for Others;Stairs;Carry;Stand;Toileting    Examination-Participation Restrictions Community Activity;Laundry;Driving;Cleaning;Church;Yard Work;Shop;Meal Prep    Rehab Potential Good    PT Frequency 2x / week    PT Duration 6 weeks    PT Treatment/Interventions ADLs/Self Care Home Management;Aquatic Therapy;Cryotherapy;Electrical Stimulation;Iontophoresis 32m/ml Dexamethasone;Moist Heat;Ultrasound;Gait training;Stair training;Functional mobility training;Therapeutic activities;Therapeutic exercise;Balance training;Neuromuscular re-education;Patient/family education;Orthotic Fit/Training;Manual techniques;Scar mobilization;Passive range of motion;Taping;Dry needling;Vasopneumatic Device    PT Next Visit Plan Work on strength/balance and gait         FOTO at DC    PT Home Exercise Plan Access Code [email or web address] (QS , SAQ, SLR and heel slides with strap, march)  LAQ,  Wall bumps    Consulted and Agree with Plan of Care Patient           Patient will benefit from skilled therapeutic intervention in order to improve the following deficits and impairments:  Abnormal gait, Decreased balance, Decreased mobility, Difficulty walking, Hypomobility, Decreased range of motion, Improper body mechanics, Obesity, Decreased activity tolerance, Decreased strength, Increased fascial restricitons, Impaired flexibility  Visit  Diagnosis: Muscle weakness (generalized)  Rupture of left quadriceps tendon, initial encounter  Rupture of patellar tendon, right, initial encounter  Other abnormalities of gait and mobility  Stiffness of right knee, not elsewhere classified  Stiffness of left knee, not elsewhere classified     Problem List Patient Active Problem List   Diagnosis Date Noted  . Hypertension 10/20/2019  . Sleep apnea 10/20/2019  . [place]rial fibrillation ([place] 10/20/2019  . Knee pain 10/20/2019  . Diabetes mellitus without complication ([place]   . CHF (congestive heart failure) ([place]   . Gout   . Rupture of left quadriceps tendon   . Rupture of patellar tendon, right, initial encounter   . ATN (acute tubular necrosis) ([facility]      KHaydee Monica PT, DPT 05/15/20 1:37 PM  [facility] [address]Phone: [phone]  Fax:  [phone] Name: ANefi MusichMRN: [id number] of Birth: 903-30-1972

## 2020-05-17 ENCOUNTER — Ambulatory Visit: Payer: Medicare Other | Attending: Orthopedic Surgery

## 2020-05-17 ENCOUNTER — Other Ambulatory Visit: Payer: Self-pay

## 2020-05-17 DIAGNOSIS — M6281 Muscle weakness (generalized): Secondary | ICD-10-CM | POA: Insufficient documentation

## 2020-05-17 DIAGNOSIS — S86811A Strain of other muscle(s) and tendon(s) at lower leg level, right leg, initial encounter: Secondary | ICD-10-CM | POA: Diagnosis present

## 2020-05-17 DIAGNOSIS — R2689 Other abnormalities of gait and mobility: Secondary | ICD-10-CM | POA: Insufficient documentation

## 2020-05-17 DIAGNOSIS — S76112A Strain of left quadriceps muscle, fascia and tendon, initial encounter: Secondary | ICD-10-CM | POA: Diagnosis present

## 2020-05-17 NOTE — Therapy (Signed)
[facility] [address] [place], [place], [address] Phone: [phone]   Fax:  [phone]  Physical Therapy Treatment  Patient Details  Name: Douglas [name] MRN: [id number] Date of Birth: 1970-07-27 Referring Provider (PT): Dorene Grebe   Encounter Date: 05/17/2020   PT End of Session - 05/17/20 1241    Visit Number 23    Number of Visits 28    Date for PT Re-Evaluation 06/29/20    Authorization Type UHC MCR    Authorization Time Period FOTO visit #18   KX visit 15    Progress Note Due on Visit 28    PT Start Time 1130    PT Stop Time 1215    PT Time Calculation (min) 45 min    Activity Tolerance Patient tolerated treatment well    Behavior During Therapy [facility] for tasks assessed/performed           Past Medical History:  Diagnosis Date  . CHF (congestive heart failure) ([facility])   . Degenerative arthritis   . Depression   . Diabetes mellitus without complication ([facility])   . GERD (gastroesophageal reflux disease)   . Gout   . HLD (hyperlipidemia)   . Hypertension   . Seasonal allergies     Past Surgical History:  Procedure Laterality Date  . KNEE ARTHROSCOPY Left 11/01/2019   Procedure: ARTHROSCOPY KNEE AND MENISCUS DEBRIDEMENT;  Surgeon: Cammy Copa, MD;  Location: [facility] OR;  Service: Orthopedics;  Laterality: Left;  . KNEE ARTHROSCOPY WITH PATELLAR TENDON REPAIR Right 11/01/2019   Procedure: KNEE ARTHROSCOPY WITH PATELLAR TENDON REPAIR;  Surgeon: Cammy Copa, MD;  Location: [facility] OR;  Service: Orthopedics;  Laterality: Right;  . TENDON REPAIR Left 11/01/2019   Procedure: QUADRICEP TENDON REPAIR;  Surgeon: Cammy Copa, MD;  Location: [facility] OR;  Service: Orthopedics;  Laterality: Left;    There were no vitals filed for this visit.   Subjective Assessment - 05/17/20 1306    Subjective No pain , some mild soreness but less due to increased ambient temp. He is driving but does not drive to PT. He is  getting arpound at home fine but needs the cane.    Currently in Pain? No/denies                             [facility] Adult PT Treatment/Exercise - 05/17/20 0001      Knee/Hip Exercises: Stretches   Other Knee/Hip Stretches RT knee extension .       Knee/Hip Exercises: Aerobic   Nustep L6 x 10      Knee/Hip Exercises: Standing   Heel Raises Both;20 reps    Heel Raises Limitations BUE support at freemotion    Forward Step Up Both;20 reps;Step Height: 4"    Forward Step Up Limitations 10x ascending with RLE and descending with LLE; 10x ascending with LLE and descending with RLE; BUE support at freemotion    Functional Squat --    Functional Squat Limitations --    SLS 2 x 30 sec each LE with BUE support as needed to maintain balance      Knee/Hip Exercises: Seated   Long Arc Quad AROM;Strengthening;Both;Weights    Long Arc Quad Weight 6 lbs.    Long [employer] Limitations 5 second holds at end range knee extension  30 reps each    Sit to Sand without UE support;Other (comment);10 reps;3 sets   5 reps 20  sec.      Knee/Hip Exercises: Sidelying   Other Sidelying Knee/Hip Exercises worked on terminal knee ext in side lying . still not able to fully extend knee but was moving  lower leg > 30 degrees from full extension                   PT Education - 05/17/20 1239    Education Details Possible knee flexion contracture due  to lack of TKE so need to stretch on a regular basisi    Person(s) Educated Patient    Methods Explanation    Comprehension Verbalized understanding            PT Short Term Goals - 03/27/20 1255      PT SHORT TERM GOAL #1   Title Pt and caregiver will be independent with HEP    Status Achieved      PT SHORT TERM GOAL #2   Title Pt will be able to perform 5x sit<>stand from w/c with min A    Status Achieved      PT SHORT TERM GOAL #3   Title Pt will be able to perform 5 SLR with no lag    Baseline still a lag RT    Status  On-going      PT SHORT TERM GOAL #4   Title Pt will be able to tolerate standing ~30 sec for grooming task    Baseline stood and ball toss without LOB    Status Achieved             PT Long Term Goals - 05/17/20 1308      PT LONG TERM GOAL #6   Title 5x sit to stand in 20 seconds    Baseline 20 sec today    Status Achieved                 Plan - 05/17/20 1241    Clinical Impression Statement Continues to have RT quad lag  . Unable to fully extend in sidelying. Makes need for UE asssit with steps and need for cane with walking to minimize risk of fall    PT Treatment/Interventions ADLs/Self Care Home Management;Aquatic Therapy;Cryotherapy;Electrical Stimulation;Iontophoresis 4mg /ml Dexamethasone;Moist Heat;Ultrasound;Gait training;Stair training;Functional mobility training;Therapeutic activities;Therapeutic exercise;Balance training;Neuromuscular re-education;Patient/family education;Orthotic Fit/Training;Manual techniques;Scar mobilization;Passive range of motion;Taping;Dry needling;Vasopneumatic Device    PT Next Visit Plan Work on strength/balance and gait         FOTO at DC at end of month    PT Home Exercise Plan Access Code [email or web address] (QS , SAQ, SLR and heel slides with strap, march)  LAQ,  Wall bumps    Consulted and Agree with Plan of Care Patient           Patient will benefit from skilled therapeutic intervention in order to improve the following deficits and impairments:  Abnormal gait, Decreased balance, Decreased mobility, Difficulty walking, Hypomobility, Decreased range of motion, Improper body mechanics, Obesity, Decreased activity tolerance, Decreased strength, Increased fascial restricitons, Impaired flexibility  Visit Diagnosis: Muscle weakness (generalized)  Rupture of left quadriceps tendon, initial encounter  Rupture of patellar tendon, right, initial encounter     Problem List Patient Active Problem List   Diagnosis Date Noted  . Hypertension  10/20/2019  . Sleep apnea 10/20/2019  . Atrial fibrillation ([facility]) 10/20/2019  . Knee pain 10/20/2019  . Diabetes mellitus without complication ([facility])   . CHF (congestive heart failure) ([facility])   . Gout   . Rupture of left quadriceps  tendon   . Rupture of patellar tendon, right, initial encounter   . ATN (acute tubular necrosis) ([facility])     Caprice Red  PT 05/17/2020, 1:11 PM  [facility] [address] [place], [place], [address] Phone: [phone]   Fax:  [phone]  Name: Douglas [name] MRN: [id number] Date of Birth: 05-11-1971

## 2020-05-22 ENCOUNTER — Other Ambulatory Visit: Payer: Self-pay

## 2020-05-22 ENCOUNTER — Ambulatory Visit: Payer: Medicare Other

## 2020-05-22 DIAGNOSIS — M6281 Muscle weakness (generalized): Secondary | ICD-10-CM | POA: Diagnosis not present

## 2020-05-22 DIAGNOSIS — S76112A Strain of left quadriceps muscle, fascia and tendon, initial encounter: Secondary | ICD-10-CM

## 2020-05-22 DIAGNOSIS — S86811A Strain of other muscle(s) and tendon(s) at lower leg level, right leg, initial encounter: Secondary | ICD-10-CM

## 2020-05-22 DIAGNOSIS — R2689 Other abnormalities of gait and mobility: Secondary | ICD-10-CM

## 2020-05-22 NOTE — Therapy (Signed)
[facility] Outpatient Rehabilitation [facility] [address] [place], [place], [address] Phone: [phone]   Fax:  [phone]  Physical Therapy Treatment  Patient Details  Name: Douglas [name] MRN: [id number] Date of Birth: [birth year] Referring Provider (PT): Dorene Grebe   Encounter Date: 05/22/2020   PT End of Session - 05/22/20 0946    Visit Number 24    Number of Visits 28    Date for PT Re-Evaluation 06/29/20    Authorization Type UHC MCR    Authorization Time Period FOTO visit #18   KX visit 15    PT Start Time 0945    PT Stop Time 1033    PT Time Calculation (min) 48 min    Activity Tolerance Patient tolerated treatment well    Behavior During Therapy [facility] for tasks assessed/performed           Past Medical History:  Diagnosis Date  . CHF (congestive heart failure) ([facility])   . Degenerative arthritis   . Depression   . Diabetes mellitus without complication ([facility])   . GERD (gastroesophageal reflux disease)   . Gout   . HLD (hyperlipidemia)   . Hypertension   . Seasonal allergies     Past Surgical History:  Procedure Laterality Date  . KNEE ARTHROSCOPY Left 11/01/2019   Procedure: ARTHROSCOPY KNEE AND MENISCUS DEBRIDEMENT;  Surgeon: Cammy Copa, MD;  Location: [facility] OR;  Service: Orthopedics;  Laterality: Left;  . KNEE ARTHROSCOPY WITH PATELLAR TENDON REPAIR Right 11/01/2019   Procedure: KNEE ARTHROSCOPY WITH PATELLAR TENDON REPAIR;  Surgeon: Cammy Copa, MD;  Location: [facility] OR;  Service: Orthopedics;  Laterality: Right;  . TENDON REPAIR Left 11/01/2019   Procedure: QUADRICEP TENDON REPAIR;  Surgeon: Cammy Copa, MD;  Location: [facility] OR;  Service: Orthopedics;  Laterality: Left;    There were no vitals filed for this visit.   Subjective Assessment - 05/22/20 1037    Subjective No pain , no new complaints. still ned for [facility] due to instability. Getting around hous well and be in community but cautious    Currently in Pain?  No/denies                             [facility] Adult PT Treatment/Exercise - 05/22/20 0001      Knee/Hip Exercises: Aerobic   Nustep L6 x 10      Knee/Hip Exercises: Standing   Heel Raises Both;20 reps    Heel Raises Limitations BUE support at freemotion    Forward Step Up Both;20 reps;Step Height: 4"    Forward Step Up Limitations 10x ascending with RLE and descending with LLE; 10x ascending with LLE and descending with RLE; BUE support at freemotion    Functional Squat 2 sets;10 reps    Functional Squat Limitations Mini squats at freemotion    SLS 2 x 30 sec each LE with BUE support as needed to maintain balance      Knee/Hip Exercises: Seated   Long Arc Quad AROM;Strengthening;Both;Weights    Long Arc Quad Weight 8 lbs.    Long [employer] Limitations 5 second holds at end range knee extension  30 reps each      Knee/Hip Exercises: Supine   Bridges Both;5 reps    Single Leg Bridge Right;Left;3 sets;5 reps                    PT Short Term Goals - 03/27/20 1255  PT SHORT TERM GOAL #1   Title Pt and caregiver will be independent with HEP    Status Achieved      PT SHORT TERM GOAL #2   Title Pt will be able to perform 5x sit<>stand from w/c with min A    Status Achieved      PT SHORT TERM GOAL #3   Title Pt will be able to perform 5 SLR with no lag    Baseline still a lag RT    Status On-going      PT SHORT TERM GOAL #4   Title Pt will be able to tolerate standing ~30 sec for grooming task    Baseline stood and ball toss without LOB    Status Achieved             PT Long Term Goals - 05/17/20 1308      PT LONG TERM GOAL #6   Title 5x sit to stand in 20 seconds    Baseline 20 sec today    Status Achieved                 Plan - 05/22/20 0947    Clinical Impression Statement No pain . Added single leg bridge and and side lye hip exercises for HEP     Continue with strength .  May progess HEP more    PT  Treatment/Interventions ADLs/Self Care Home Management;Aquatic Therapy;Cryotherapy;Electrical Stimulation;Iontophoresis 4mg /ml Dexamethasone;Moist Heat;Ultrasound;Gait training;Stair training;Functional mobility training;Therapeutic activities;Therapeutic exercise;Balance training;Neuromuscular re-education;Patient/family education;Orthotic Fit/Training;Manual techniques;Scar mobilization;Passive range of motion;Taping;Dry needling;Vasopneumatic Device    PT Next Visit Plan Work on strength/balance and gait         FOTO at DC at end of month    PT Home Exercise Plan Access Code [email or web address] (QS , SAQ, SLR and heel slides with strap, march)  LAQ,  Wall bumps,  side lye hip abduction and clams ,  single leg bridge  red band issuesd    Consulted and Agree with Plan of Care Patient           Patient will benefit from skilled therapeutic intervention in order to improve the following deficits and impairments:  Abnormal gait, Decreased balance, Decreased mobility, Difficulty walking, Hypomobility, Decreased range of motion, Improper body mechanics, Obesity, Decreased activity tolerance, Decreased strength, Increased fascial restricitons, Impaired flexibility  Visit Diagnosis: Muscle weakness (generalized)  Rupture of left quadriceps tendon, initial encounter  Rupture of patellar tendon, right, initial encounter  Other abnormalities of gait and mobility     Problem List Patient Active Problem List   Diagnosis Date Noted  . Hypertension 10/20/2019  . Sleep apnea 10/20/2019  . Atrial fibrillation ([facility]) 10/20/2019  . Knee pain 10/20/2019  . Diabetes mellitus without complication ([facility])   . CHF (congestive heart failure) ([facility])   . Gout   . Rupture of left quadriceps tendon   . Rupture of patellar tendon, right, initial encounter   . ATN (acute tubular necrosis) [facility])     [facility]  PT 05/22/2020, 10:38 AM  [facility] [address] [place], [place], [place] Phone: [phone]   Fax:  [phone]  Name: Carliss Quast MRN: [name] Date of Birth: [birth year]

## 2020-05-24 ENCOUNTER — Other Ambulatory Visit: Payer: Self-pay

## 2020-05-24 ENCOUNTER — Ambulatory Visit: Payer: Medicare Other

## 2020-05-24 DIAGNOSIS — S86811A Strain of other muscle(s) and tendon(s) at lower leg level, right leg, initial encounter: Secondary | ICD-10-CM

## 2020-05-24 DIAGNOSIS — M6281 Muscle weakness (generalized): Secondary | ICD-10-CM

## 2020-05-24 DIAGNOSIS — S76112A Strain of left quadriceps muscle, fascia and tendon, initial encounter: Secondary | ICD-10-CM

## 2020-05-24 DIAGNOSIS — R2689 Other abnormalities of gait and mobility: Secondary | ICD-10-CM

## 2020-05-24 NOTE — Therapy (Signed)
[facility] Outpatient Rehabilitation [facility] [address] [place], [place], [address] Phone: [phone]   Fax:  [phone]  Physical Therapy Treatment  Patient Details  Name: Douglas [name] MRN: [id number] Date of Birth: 02-26-1971 Referring Provider (PT): Dorene Grebe   Encounter Date: 05/24/2020   PT End of Session - 05/24/20 0953    Visit Number 25    Number of Visits 28    Date for PT Re-Evaluation 06/29/20    Authorization Type UHC MCR    Authorization Time Period FOTO visit #18   KX visit 15    Progress Note Due on Visit 28    PT Start Time 0955    PT Stop Time 1035    PT Time Calculation (min) 40 min    Activity Tolerance Patient tolerated treatment well    Behavior During Therapy [facility] for tasks assessed/performed           Past Medical History:  Diagnosis Date  . CHF (congestive heart failure) (HCC)   . Degenerative arthritis   . Depression   . Diabetes mellitus without complication (HCC)   . GERD (gastroesophageal reflux disease)   . Gout   . HLD (hyperlipidemia)   . Hypertension   . Seasonal allergies     Past Surgical History:  Procedure Laterality Date  . KNEE ARTHROSCOPY Left 11/01/2019   Procedure: ARTHROSCOPY KNEE AND MENISCUS DEBRIDEMENT;  Surgeon: Cammy Copa, MD;  Location: [facility] OR;  Service: Orthopedics;  Laterality: Left;  . KNEE ARTHROSCOPY WITH PATELLAR TENDON REPAIR Right 11/01/2019   Procedure: KNEE ARTHROSCOPY WITH PATELLAR TENDON REPAIR;  Surgeon: Cammy Copa, MD;  Location: [facility] OR;  Service: Orthopedics;  Laterality: Right;  . TENDON REPAIR Left 11/01/2019   Procedure: QUADRICEP TENDON REPAIR;  Surgeon: Cammy Copa, MD;  Location: [facility] OR;  Service: Orthopedics;  Laterality: Left;    There were no vitals filed for this visit.   Subjective Assessment - 05/24/20 0952    Subjective no pain                             [facility] Adult PT Treatment/Exercise - 05/24/20 0001       Ambulation/Gait   Ambulation Distance (Feet) 460 Feet    Assistive device Large base quad cane    Gait Pattern Step-through pattern    Ambulation Surface Level;Indoor    Gait velocity 1.24 feet /sec    Gait Comments 6 min walk      Knee/Hip Exercises: Aerobic   Nustep L6 x 10      Knee/Hip Exercises: Standing   Heel Raises Limitations BUE support at freemotion   25 reps    Hip Abduction Right;Left;15 reps;Knee straight    Abduction Limitations 4#    Hip Extension Right;Left;15 reps    Extension Limitations 4#    Forward Step Up 20 reps;Hand Hold: 2;Right;Left    Forward Step Up Limitations on off foam    Functional Squat 20 reps;3 seconds    Functional Squat Limitations Mini squats at freemotion    Rocker Board 2 minutes    Other Standing Knee Exercises March RT/LT   4#  x 25      Knee/Hip Exercises: Seated   Long Arc Quad AROM;Strengthening;Both;Weights    Long Arc Quad Weight 8 lbs.    Long [employer] Limitations 5 second holds at end range knee extension  30 reps each    Sit to [employer]  2 sets;10 reps;without UE support                    PT Short Term Goals - 03/27/20 1255      PT SHORT TERM GOAL #1   Title Pt and caregiver will be independent with HEP    Status Achieved      PT SHORT TERM GOAL #2   Title Pt will be able to perform 5x sit<>stand from w/c with min A    Status Achieved      PT SHORT TERM GOAL #3   Title Pt will be able to perform 5 SLR with no lag    Baseline still a lag RT    Status On-going      PT SHORT TERM GOAL #4   Title Pt will be able to tolerate standing ~30 sec for grooming task    Baseline stood and ball toss without LOB    Status Achieved             PT Long Term Goals - 05/17/20 1308      PT LONG TERM GOAL #6   Title 5x sit to stand in 20 seconds    Baseline 20 sec today    Status Achieved                 Plan - 05/24/20 0953    Clinical Impression Statement No pain . All exercises done without fatigue   No  changes . Will contnue to emphasize endurance and strength    PT Treatment/Interventions ADLs/Self Care Home Management;Aquatic Therapy;Cryotherapy;Electrical Stimulation;Iontophoresis 4mg /ml Dexamethasone;Moist Heat;Ultrasound;Gait training;Stair training;Functional mobility training;Therapeutic activities;Therapeutic exercise;Balance training;Neuromuscular re-education;Patient/family education;Orthotic Fit/Training;Manual techniques;Scar mobilization;Passive range of motion;Taping;Dry needling;Vasopneumatic Device    PT Next Visit Plan Work on strength/balance and gait         FOTO at DC at end of month    PT Home Exercise Plan Access Code [email or web address] (QS , SAQ, SLR and heel slides with strap, march)  LAQ,  Wall bumps,  side lye hip abduction and clams ,  single leg bridge  red band issuesd    Consulted and Agree with Plan of Care Patient           Patient will benefit from skilled therapeutic intervention in order to improve the following deficits and impairments:  Abnormal gait,Decreased balance,Decreased mobility,Difficulty walking,Hypomobility,Decreased range of motion,Improper body mechanics,Obesity,Decreased activity tolerance,Decreased strength,Increased fascial restricitons,Impaired flexibility  Visit Diagnosis: Muscle weakness (generalized)  Rupture of left quadriceps tendon, initial encounter  Rupture of patellar tendon, right, initial encounter  Other abnormalities of gait and mobility     Problem List Patient Active Problem List   Diagnosis Date Noted  . Hypertension 10/20/2019  . Sleep apnea 10/20/2019  . Atrial fibrillation (HCC) 10/20/2019  . Knee pain 10/20/2019  . Diabetes mellitus without complication (HCC)   . CHF (congestive heart failure) (HCC)   . Gout   . Rupture of left quadriceps tendon   . Rupture of patellar tendon, right, initial encounter   . ATN (acute tubular necrosis) [facility])     [facility]  PT 05/24/2020, 10:52 AM  [facility] [address] [place], [place], [place] Phone: [phone]   Fax:  [phone]  Name: Beaux Wedemeyer MRN: [name] Date of Birth: [birth year]

## 2020-05-28 ENCOUNTER — Ambulatory Visit: Payer: Medicare Other

## 2020-06-04 ENCOUNTER — Ambulatory Visit: Payer: Medicare Other

## 2020-06-04 ENCOUNTER — Other Ambulatory Visit: Payer: Self-pay

## 2020-06-04 DIAGNOSIS — S86811A Strain of other muscle(s) and tendon(s) at lower leg level, right leg, initial encounter: Secondary | ICD-10-CM

## 2020-06-04 DIAGNOSIS — M6281 Muscle weakness (generalized): Secondary | ICD-10-CM

## 2020-06-04 DIAGNOSIS — S76112A Strain of left quadriceps muscle, fascia and tendon, initial encounter: Secondary | ICD-10-CM

## 2020-06-04 DIAGNOSIS — R2689 Other abnormalities of gait and mobility: Secondary | ICD-10-CM

## 2020-06-04 NOTE — Therapy (Signed)
[facility] Outpatient Rehabilitation [facility] [address] [place], [place], [address] Phone: [phone]   Fax:  [phone]  Physical Therapy Treatment  Patient Details  Name: Douglas [name] MRN: [id number] Date of Birth: 07/18/1970 Referring Provider (PT): Dorene Grebe   Encounter Date: 06/04/2020   PT End of Session - 06/04/20 0903    Visit Number 26    Number of Visits 28    Date for PT Re-Evaluation 06/29/20    Authorization Type UHC MCR    Authorization Time Period FOTO visit #18   KX visit 15    Progress Note Due on Visit 28    PT Start Time 0900    PT Stop Time 0945    PT Time Calculation (min) 45 min    Activity Tolerance Patient tolerated treatment well    Behavior During Therapy [facility] for tasks assessed/performed           Past Medical History:  Diagnosis Date  . CHF (congestive heart failure) (HCC)   . Degenerative arthritis   . Depression   . Diabetes mellitus without complication (HCC)   . GERD (gastroesophageal reflux disease)   . Gout   . HLD (hyperlipidemia)   . Hypertension   . Seasonal allergies     Past Surgical History:  Procedure Laterality Date  . KNEE ARTHROSCOPY Left 11/01/2019   Procedure: ARTHROSCOPY KNEE AND MENISCUS DEBRIDEMENT;  Surgeon: Cammy Copa, MD;  Location: [facility] OR;  Service: Orthopedics;  Laterality: Left;  . KNEE ARTHROSCOPY WITH PATELLAR TENDON REPAIR Right 11/01/2019   Procedure: KNEE ARTHROSCOPY WITH PATELLAR TENDON REPAIR;  Surgeon: Cammy Copa, MD;  Location: [facility] OR;  Service: Orthopedics;  Laterality: Right;  . TENDON REPAIR Left 11/01/2019   Procedure: QUADRICEP TENDON REPAIR;  Surgeon: Cammy Copa, MD;  Location: [facility] OR;  Service: Orthopedics;  Laterality: Left;    There were no vitals filed for this visit.   Subjective Assessment - 06/04/20 0949    Subjective No pain Walked alot a the light show. Couldn't hardly get out of bed the next day but doing fine now.     Currently in Pain? No/denies              [facility] PT Assessment - 06/04/20 0001      Observation/Other Assessments   Focus on Therapeutic Outcomes (FOTO)  72%                         [facility] Adult PT Treatment/Exercise - 06/04/20 0001      Knee/Hip Exercises: Aerobic   Nustep L6 x 8 min  LE      Knee/Hip Exercises: Standing   Heel Raises Limitations BUE support at freemotion   25 reps    Hip Abduction Right;Left;15 reps;Knee straight    Abduction Limitations 5#    Hip Extension Right;Left;15 reps    Extension Limitations 5#    Forward Step Up 20 reps;Hand Hold: 2;Right;Left    Forward Step Up Limitations on off foam    Functional Squat 20 reps;3 seconds    Functional Squat Limitations Mini squats at freemotion    Rocker Board --    SLS 2 x 30 sec each LE with BUE support as needed to maintain balance    Other Standing Knee Exercises March RT/LT   5#  x 25      Knee/Hip Exercises: Seated   Long Arc Quad AROM;Strengthening;Both;Weights    Long Arc [employer] 8  lbs.    [employer] Limitations 5 second holds at end range knee extension  30 reps each    Sit to Sand 2 sets;10 reps;without UE support                    PT Short Term Goals - 03/27/20 1255      PT SHORT TERM GOAL #1   Title Pt and caregiver will be independent with HEP    Status Achieved      PT SHORT TERM GOAL #2   Title Pt will be able to perform 5x sit<>stand from w/c with min A    Status Achieved      PT SHORT TERM GOAL #3   Title Pt will be able to perform 5 SLR with no lag    Baseline still a lag RT    Status On-going      PT SHORT TERM GOAL #4   Title Pt will be able to tolerate standing ~30 sec for grooming task    Baseline stood and ball toss without LOB    Status Achieved             PT Long Term Goals - 05/17/20 1308      PT LONG TERM GOAL #6   Title 5x sit to stand in 20 seconds    Baseline 20 sec today    Status Achieved                 Plan  - 06/04/20 0939    Clinical Impression Statement FOTO score to 72% . Better than last score and far exceeding the expected progress.   Will finish next visit and deischarge.    PT Treatment/Interventions ADLs/Self Care Home Management;Aquatic Therapy;Cryotherapy;Electrical Stimulation;Iontophoresis 4mg /ml Dexamethasone;Moist Heat;Ultrasound;Gait training;Stair training;Functional mobility training;Therapeutic activities;Therapeutic exercise;Balance training;Neuromuscular re-education;Patient/family education;Orthotic Fit/Training;Manual techniques;Scar mobilization;Passive range of motion;Taping;Dry needling;Vasopneumatic Device    PT Next Visit Plan Work on strength/balance and gait    PT Home Exercise Plan Access Code [email or web address] (QS , SAQ, SLR and heel slides with strap, march)  LAQ,  Wall bumps,  side lye hip abduction and clams ,  single leg bridge  red band issuesd    Consulted and Agree with Plan of Care Patient           Patient will benefit from skilled therapeutic intervention in order to improve the following deficits and impairments:  Abnormal gait,Decreased balance,Decreased mobility,Difficulty walking,Hypomobility,Decreased range of motion,Improper body mechanics,Obesity,Decreased activity tolerance,Decreased strength,Increased fascial restricitons,Impaired flexibility  Visit Diagnosis: Muscle weakness (generalized)  Rupture of left quadriceps tendon, initial encounter  Rupture of patellar tendon, right, initial encounter  Other abnormalities of gait and mobility     Problem List Patient Active Problem List   Diagnosis Date Noted  . Hypertension 10/20/2019  . Sleep apnea 10/20/2019  . Atrial fibrillation (HCC) 10/20/2019  . Knee pain 10/20/2019  . Diabetes mellitus without complication (HCC)   . CHF (congestive heart failure) (HCC)   . Gout   . Rupture of left quadriceps tendon   . Rupture of patellar tendon, right, initial encounter   . ATN (acute tubular necrosis)  [facility])     [facility]  PT 06/04/2020, 9:50 AM  [facility] [address] [place], [place], [place] Phone: [phone]   Fax:  [phone]  Name: Charlee Whitebread MRN: [name] Date of Birth: 31-May-1971

## 2020-06-07 ENCOUNTER — Other Ambulatory Visit: Payer: Self-pay

## 2020-06-07 ENCOUNTER — Ambulatory Visit: Payer: Medicare Other

## 2020-06-07 DIAGNOSIS — S76112A Strain of left quadriceps muscle, fascia and tendon, initial encounter: Secondary | ICD-10-CM

## 2020-06-07 DIAGNOSIS — M6281 Muscle weakness (generalized): Secondary | ICD-10-CM | POA: Diagnosis not present

## 2020-06-07 DIAGNOSIS — S86811A Strain of other muscle(s) and tendon(s) at lower leg level, right leg, initial encounter: Secondary | ICD-10-CM

## 2020-06-07 NOTE — Therapy (Signed)
[place], [place], [address] Phone: [phone]   Fax:  [phone]  Physical Therapy Treatment/Discharge  Patient Details  Name: Douglas [name] MRN: [id number] Date of Birth: 08/30/1970 Referring Provider (PT): Alphonzo Severance   Encounter Date: 06/07/2020   PT End of Session - 06/07/20 1107    Visit Number 27    Number of Visits 28    Authorization Type UHC MCR    PT Start Time 1000    PT Stop Time 1105    PT Time Calculation (min) 65 min    Activity Tolerance Patient tolerated treatment well    Behavior During Therapy [facility] for tasks assessed/performed           Past Medical History:  Diagnosis Date  . CHF (congestive heart failure) ([place])   . Degenerative arthritis   . Depression   . Diabetes mellitus without complication ([place])   . GERD (gastroesophageal reflux disease)   . Gout   . HLD (hyperlipidemia)   . Hypertension   . Seasonal allergies     Past Surgical History:  Procedure Laterality Date  . KNEE ARTHROSCOPY Left 11/01/2019   Procedure: ARTHROSCOPY KNEE AND MENISCUS DEBRIDEMENT;  Surgeon: Meredith Pel, MD;  Location: [place];  Service: Orthopedics;  Laterality: Left;  . KNEE ARTHROSCOPY WITH PATELLAR TENDON REPAIR Right 11/01/2019   Procedure: KNEE ARTHROSCOPY WITH PATELLAR TENDON REPAIR;  Surgeon: Meredith Pel, MD;  Location: [place];  Service: Orthopedics;  Laterality: Right;  . TENDON REPAIR Left 11/01/2019   Procedure: QUADRICEP TENDON REPAIR;  Surgeon: Meredith Pel, MD;  Location: [place];  Service: Orthopedics;  Laterality: Left;    There were no vitals filed for this visit.   Subjective Assessment - 06/07/20 1015    Subjective no complaints. Agree to discharge              [facility] PT Assessment - 06/07/20 0001      AROM   Right Knee Extension -20    Right Knee Flexion 120    Left Knee Extension -5    Left Knee Flexion 120      Strength   Right  Knee Flexion 5/5    Right Knee Extension 4/5    Left Knee Flexion 5/5    Left Knee Extension 5/5                                 PT Education - 06/07/20 1049    Education Details FOTO score and improvement.    Person(s) Educated Patient    Methods Explanation    Comprehension Verbalized understanding            PT Short Term Goals - 03/27/20 1255      PT SHORT TERM GOAL #1   Title Pt and caregiver will be independent with HEP    Status Achieved      PT SHORT TERM GOAL #2   Title Pt will be able to perform 5x sit<>stand from w/c with min A    Status Achieved      PT SHORT TERM GOAL #3   Title Pt will be able to perform 5 SLR with no lag    Baseline still a lag RT    Status On-going      PT SHORT TERM GOAL #4   Title Pt will be able to tolerate standing ~30 sec for grooming  task    Baseline stood and ball toss without LOB    Status Achieved             PT Long Term Goals - 06/07/20 1049      PT LONG TERM GOAL #1   Title Pt will be independent with advanced HEP    Status Achieved      PT LONG TERM GOAL #2   Title Pt will be able to amb 5' with RW min A to get into his bathroom    Status Achieved      PT LONG TERM GOAL #3   Title Pt will be able to perform 5x sit<>stand with SBA    Status Achieved      PT LONG TERM GOAL #4   Title Pt will have improved FOTO score to 55% limitation    Status Achieved      PT LONG TERM GOAL #5   Title Pt will be able to perform stand pivot t/fs mod I with RW    Status Achieved      PT LONG TERM GOAL #6   Title 5x sit to stand in 20 seconds    Status Achieved      PT LONG TERM GOAL #7   Title Safely walk with SPC in clinic and at home with brace 75% of time    Status Partially Met      PT LONG TERM GOAL #8   Title He will walk into clinic with single point cane      PT LONG TERM GOAL  #9   TITLE He will be able to walk stairs step over step with 2 rails    Status Not Met      PT LONG TERM  GOAL  #10   TITLE He will be able to walk safelyin home no device 75-50% orf the time    Baseline praciting but not more than 50% and I cautioned him about risk of fall    Status Partially Met      PT LONG TERM GOAL  #11   TITLE He will be able to extend RT knee with  -5 degrees active. to improve stability of RT knee for stairs and walking without device    Baseline -20 sitting    Status Not Met                 Plan - 06/07/20 1107    Clinical Impression Statement Mr [name] has made excellent progress  and is now independent with normal activity in home  except those requiring carrying nad walking without a device. He is still at risk for fall and we discussed this . Fall risk is due to TKE weakness of RT quad . He reported again the MD told him he would be weaker on this side due to extensive damage to tendon . His AROm though has improved and possibly over time this strength can increase. He agreed to discharge and will continue HEP.    PT Treatment/Interventions ADLs/Self Care Home Management;Aquatic Therapy;Cryotherapy;Electrical Stimulation;Iontophoresis 47m/ml Dexamethasone;Moist Heat;Ultrasound;Gait training;Stair training;Functional mobility training;Therapeutic activities;Therapeutic exercise;Balance training;Neuromuscular re-education;Patient/family education;Orthotic Fit/Training;Manual techniques;Scar mobilization;Passive range of motion;Taping;Dry needling;Vasopneumatic Device    PT Next Visit Plan Discharge with HEP.    PT Home Exercise Plan Access Code [email or web address] (QS , SAQ, SLR and heel slides with strap, march)  LAQ,  Wall bumps,  side lye hip abduction and clams ,  single leg bridge  red band issuesd  Patient will benefit from skilled therapeutic intervention in order to improve the following deficits and impairments:  Abnormal gait,Decreased balance,Decreased mobility,Difficulty walking,Hypomobility,Decreased range of motion,Improper body  mechanics,Obesity,Decreased activity tolerance,Decreased strength,Increased fascial restricitons,Impaired flexibility  Visit Diagnosis: Muscle weakness (generalized)  Rupture of left quadriceps tendon, initial encounter  Rupture of patellar tendon, right, initial encounter     Problem List Patient Active Problem List   Diagnosis Date Noted  . Hypertension 10/20/2019  . Sleep apnea 10/20/2019  . Atrial fibrillation ([place]) 10/20/2019  . Knee pain 10/20/2019  . Diabetes mellitus without complication ([place])   . CHF (congestive heart failure) ([place])   . Gout   . Rupture of left quadriceps tendon   . Rupture of patellar tendon, right, initial encounter   . ATN (acute tubular necrosis) ([place])     Darrel Hoover  PT 06/07/2020, 11:11 AM  [facility] [address] [place], [place], [address] Phone: [phone]   Fax:  [phone]  Name: Kapena Hamme MRN: [id number] Date of Birth: 1971-02-09 PHYSICAL THERAPY DISCHARGE SUMMARY  Visits from Start of Care: 27  Current functional level related to goals / functional outcomes: See above   Remaining deficits: See above   Education / Equipment: HEP  Plan: Patient agrees to discharge.  Patient goals were partially met. Patient is being discharged due to                                                     ?????    Max benefit from PT at this time

## 2021-03-19 IMAGING — CR DG ABDOMEN 2V
4 series · 4 of 4 positions shown · non-contrast
Comparison: 10/16/2019.

CLINICAL DATA: Abdominal distention.

EXAM:
ABDOMEN - 2 VIEW

[abdomen supine (1 of 3)]
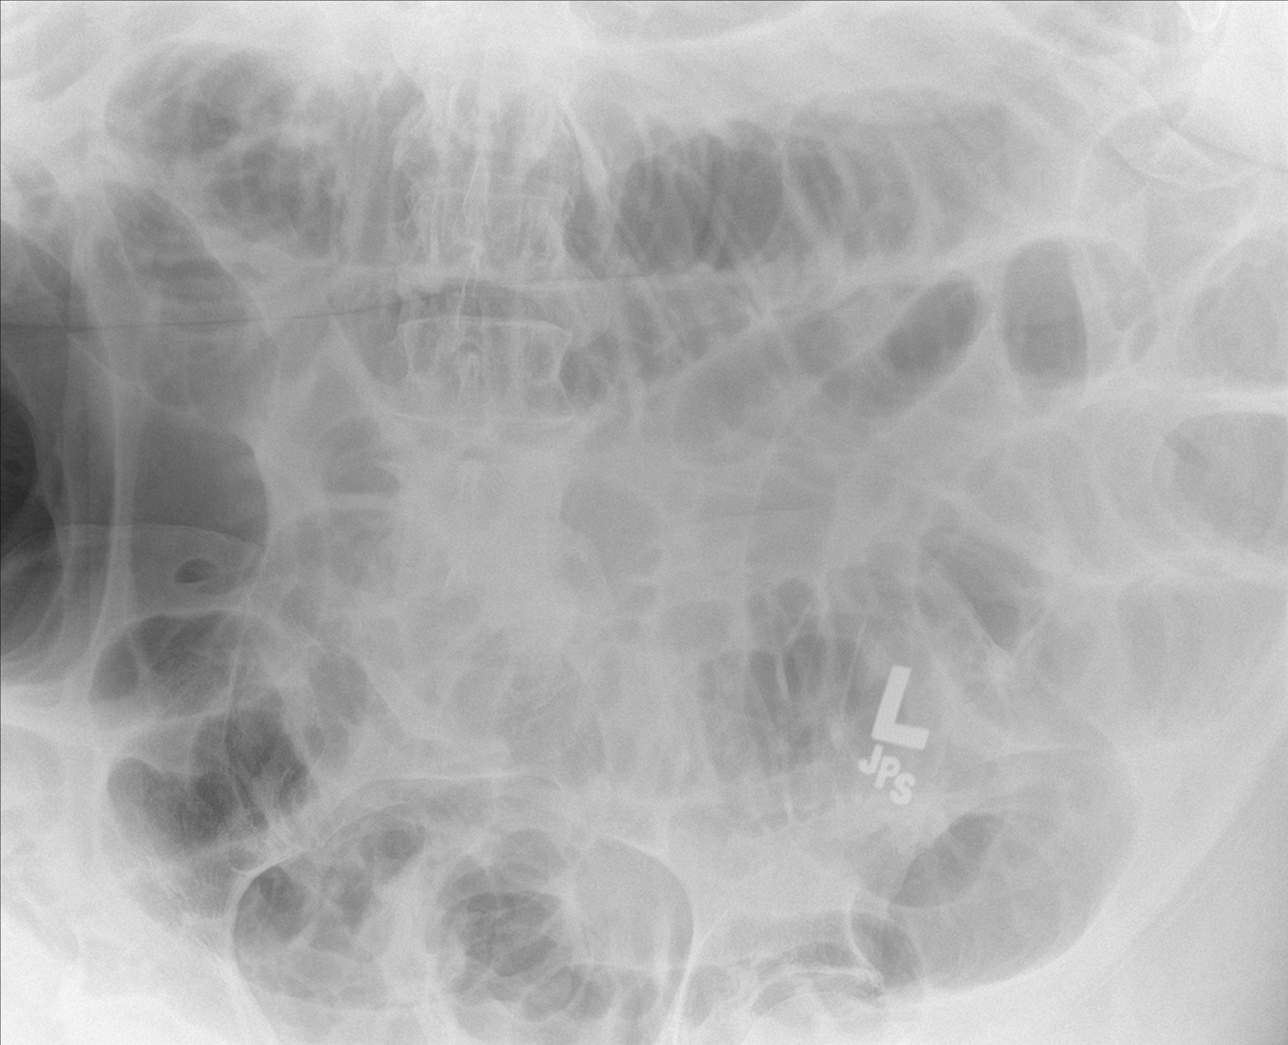

[abdomen supine (2 of 3)]
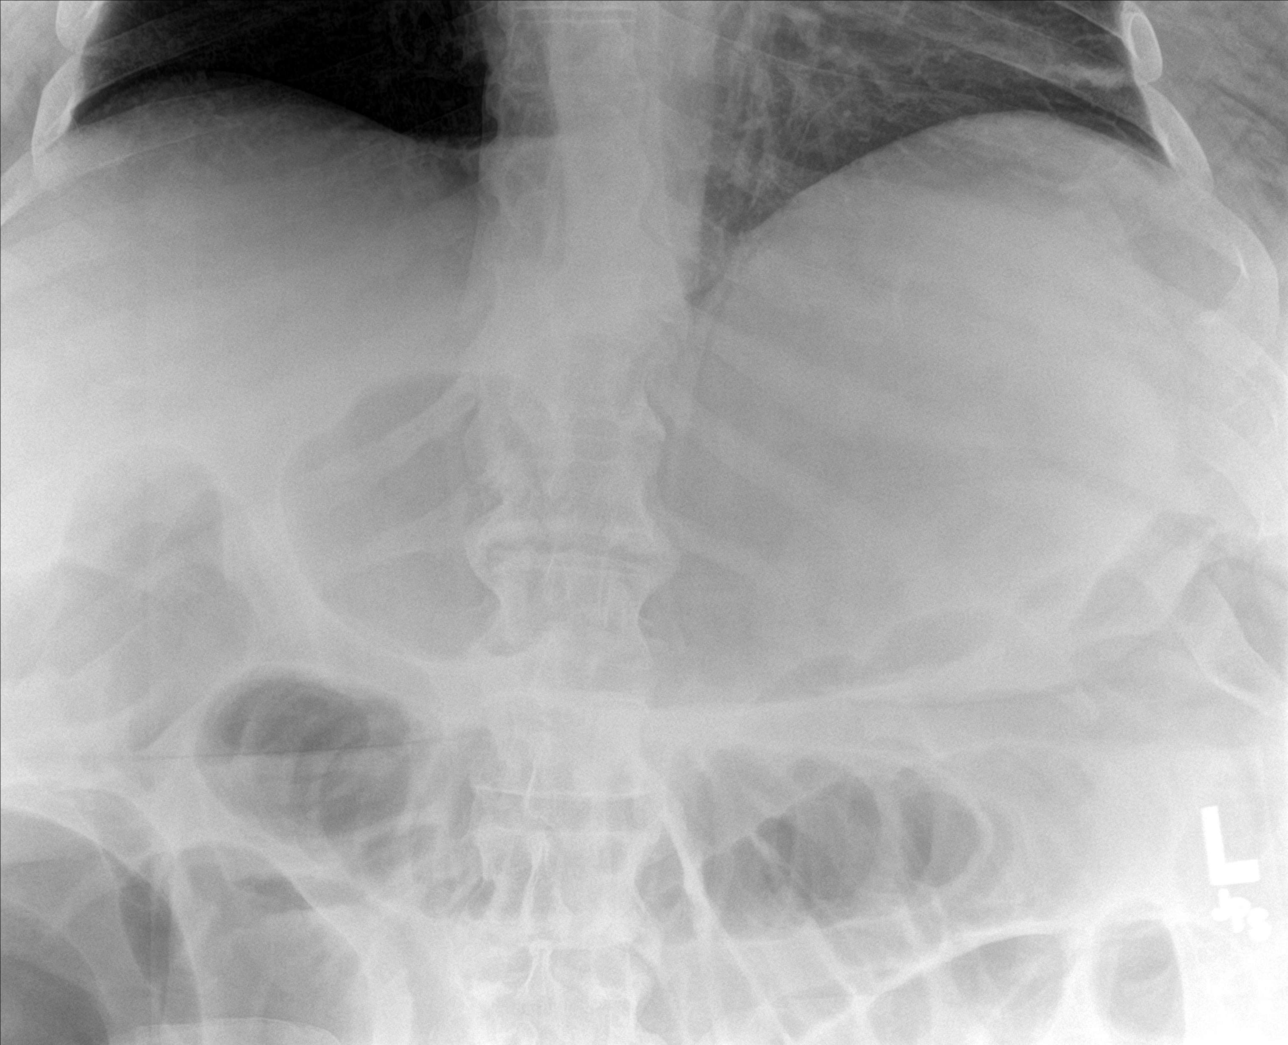

[abdomen decu]
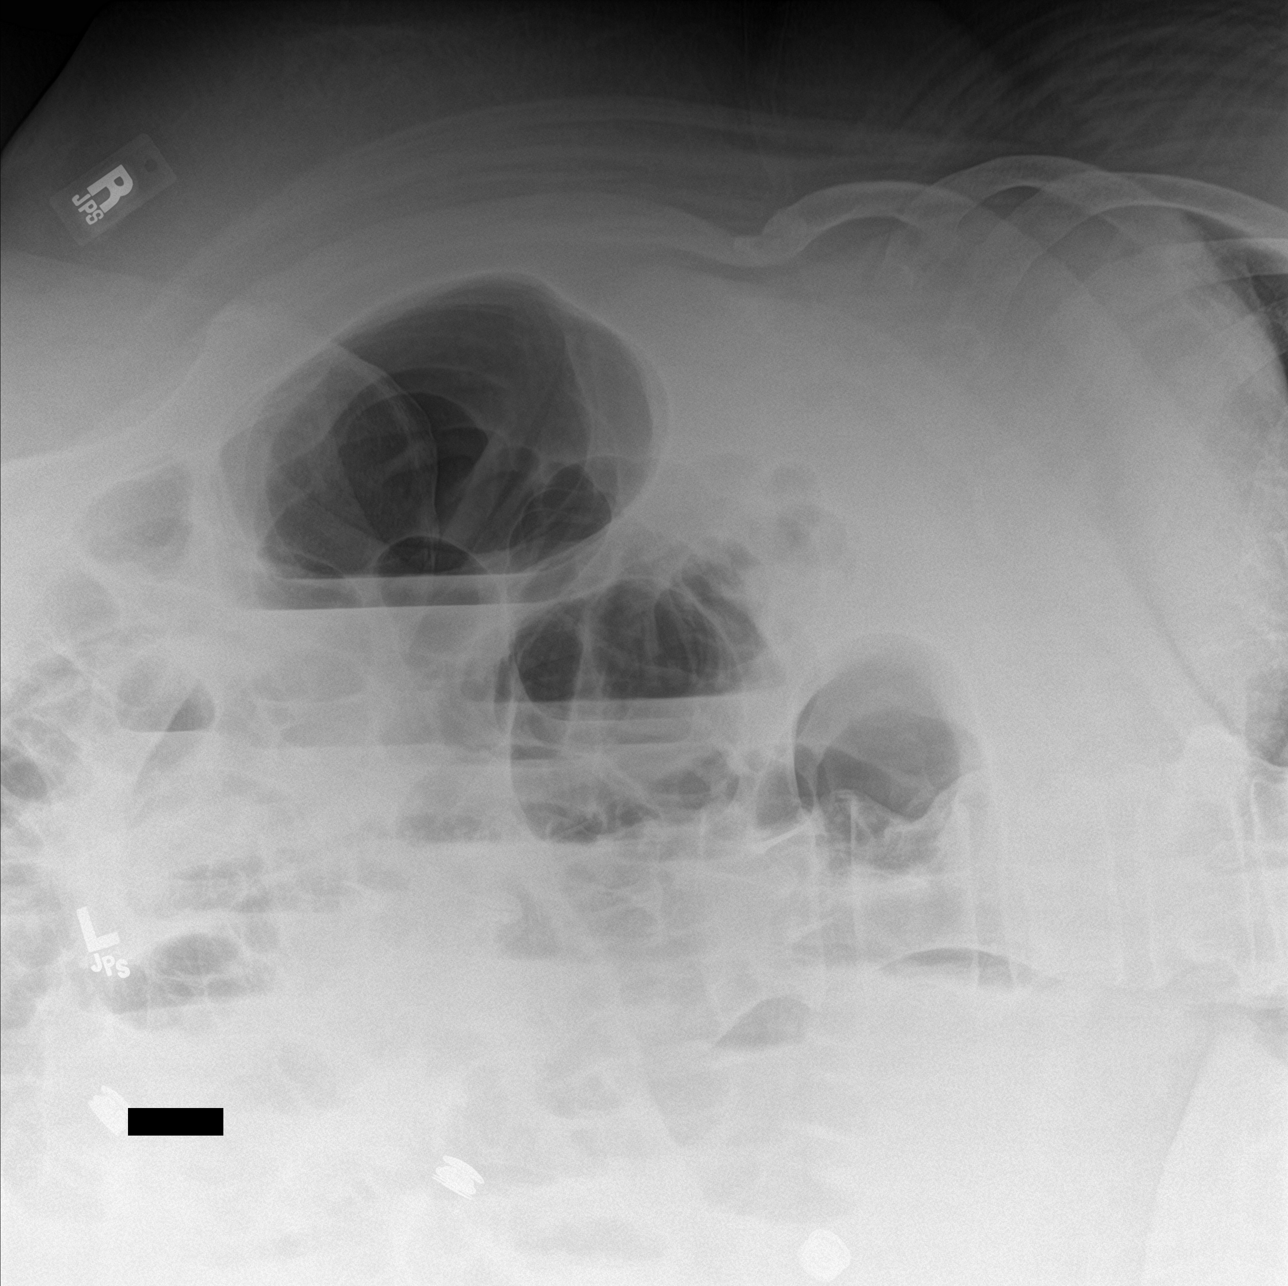

[abdomen supine (3 of 3)]
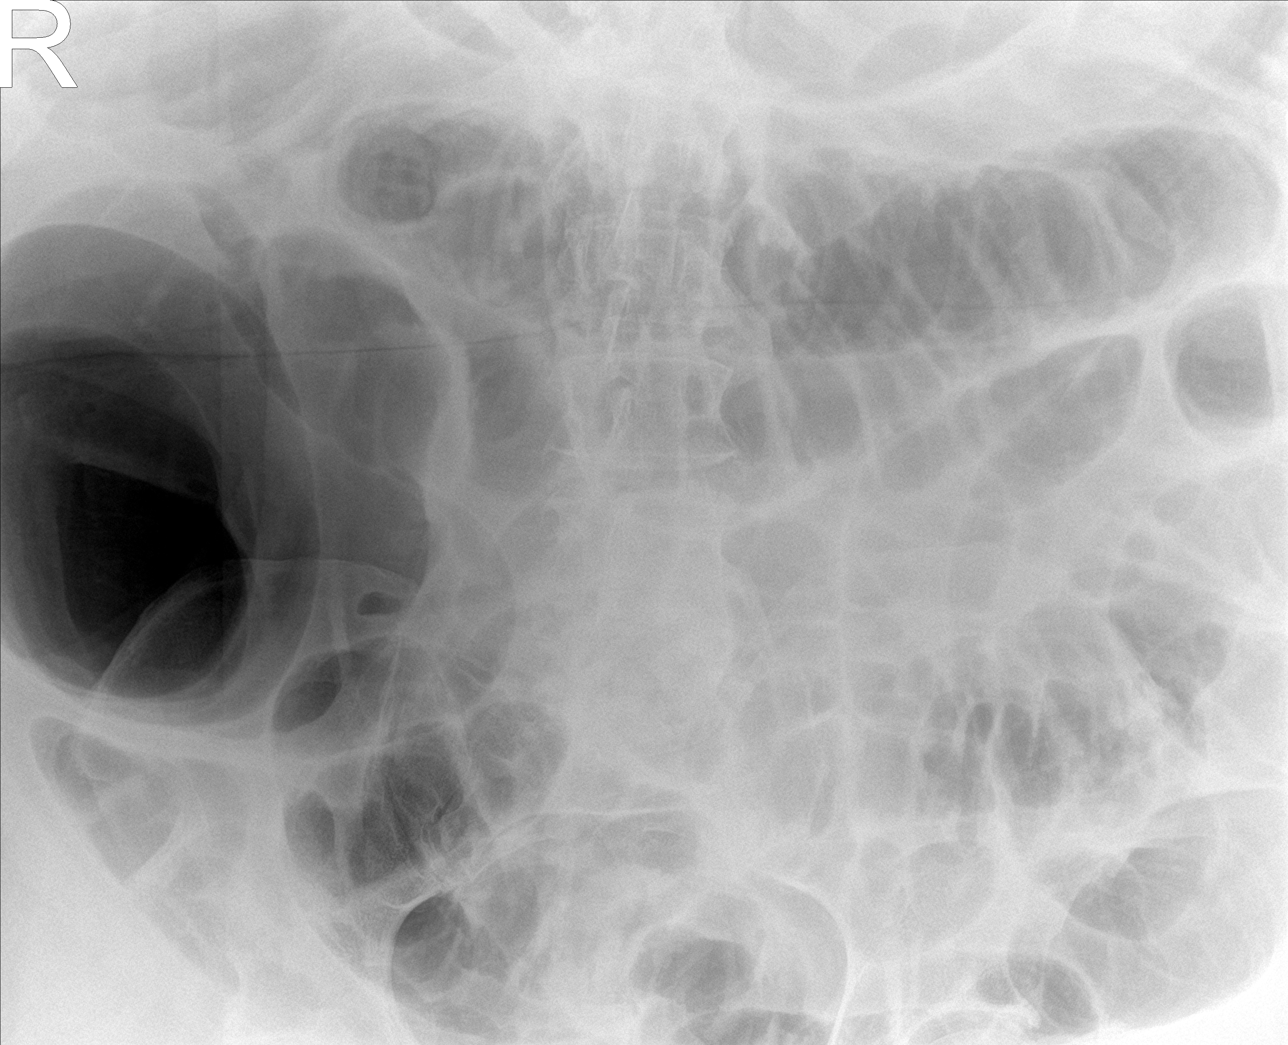

[4 of 4 positions shown; findings below may reference images not displayed]

FINDINGS: Abdomen is incompletely imaged. Multiple distended loops of small
and large bowel are noted. Bowel obstruction or adynamic ileus could
present in this fashion. Follow-up exam suggested to demonstrate
resolution. No free air identified.
IMPRESSION: Multiple distended loops of small large bowel noted. Bowel
obstruction or adynamic ileus could present this fashion. Follow-up
exam suggested to demonstrate resolution.

## 2021-03-19 IMAGING — CT CT ABD-PELV W/O CM
2 of 4 series · 15 of 46 positions shown, 17 images · non-contrast
Comparison: Abdominal radiograph from earlier today. 06/27/2013 CT
abdomen/pelvis.

CLINICAL DATA: Abdominal bloating and distension. Inpatient.

EXAM:
CT ABDOMEN AND PELVIS WITHOUT CONTRAST
TECHNIQUE: Multidetector CT imaging of the abdomen and pelvis was performed
following the standard protocol without IV contrast.

[Series 3: abd/ pelvis 5.0 i30f 2 · axial · 0.98mm/px · z∈[+1105,+1570]mm · 12 of 111 slices shown, 14 images]
[im 9/111  soft-tissue]
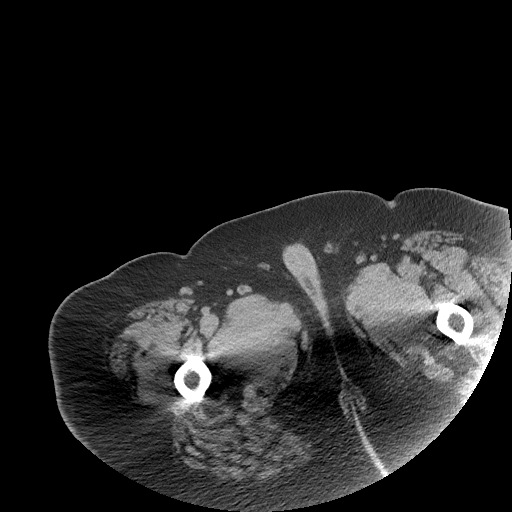
[im 9/111  bone]
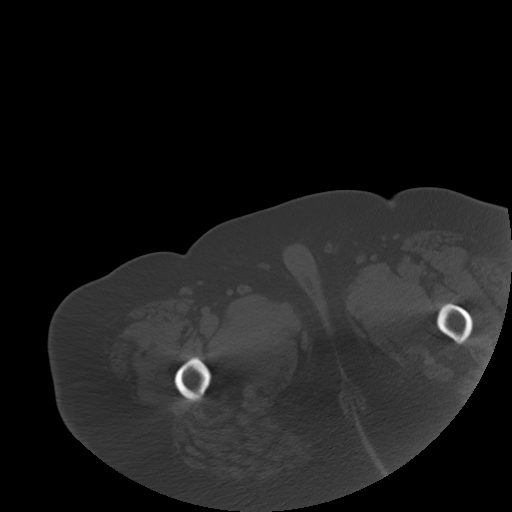
[im 17/111  soft-tissue]
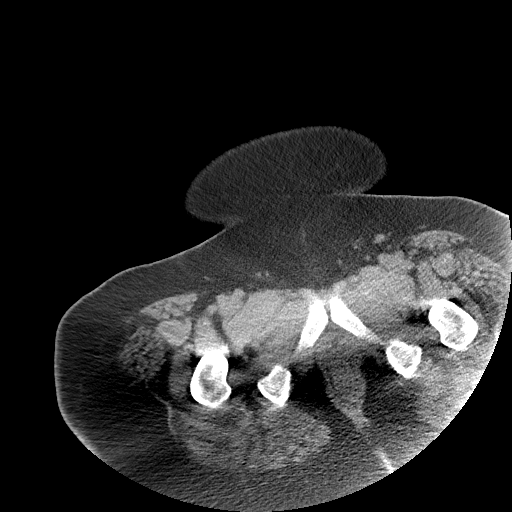
[im 26/111  soft-tissue]
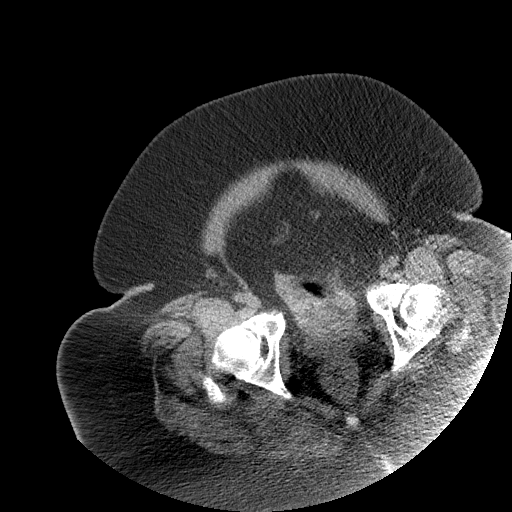
[im 34/111  soft-tissue]
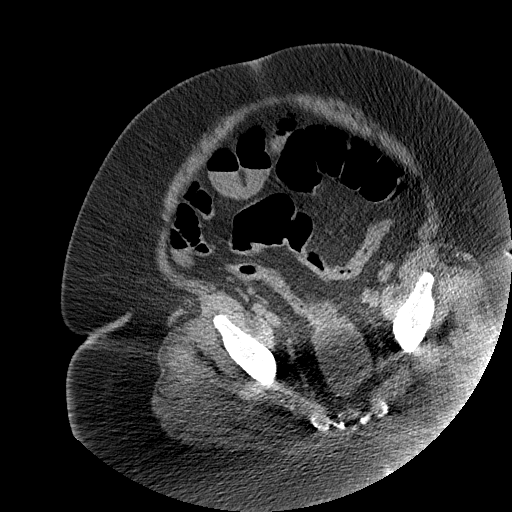
[im 43/111  soft-tissue]
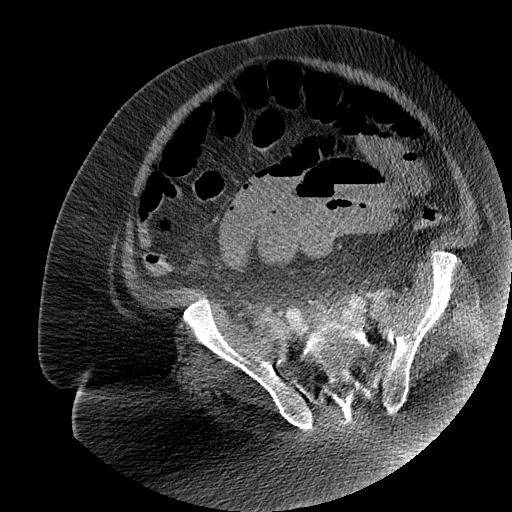
[im 51/111  soft-tissue]
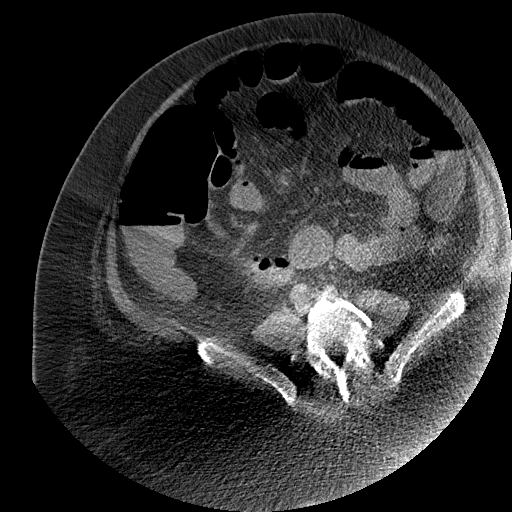
[im 60/111  soft-tissue]
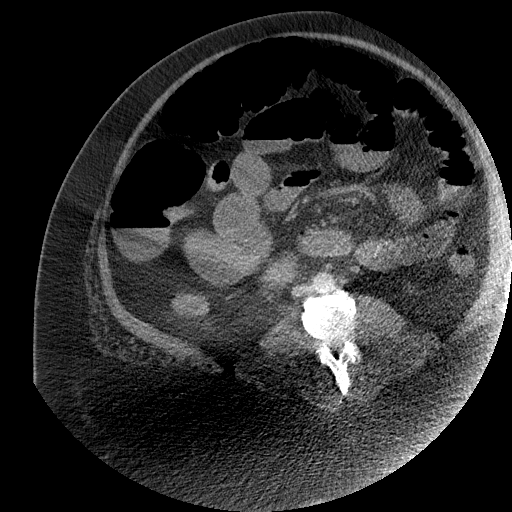
[im 68/111  soft-tissue]
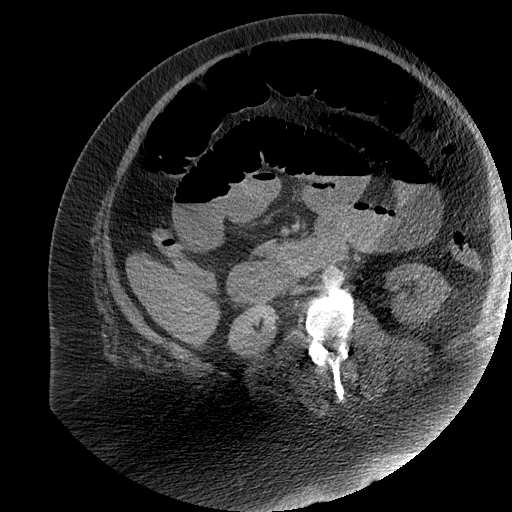
[im 77/111  soft-tissue]
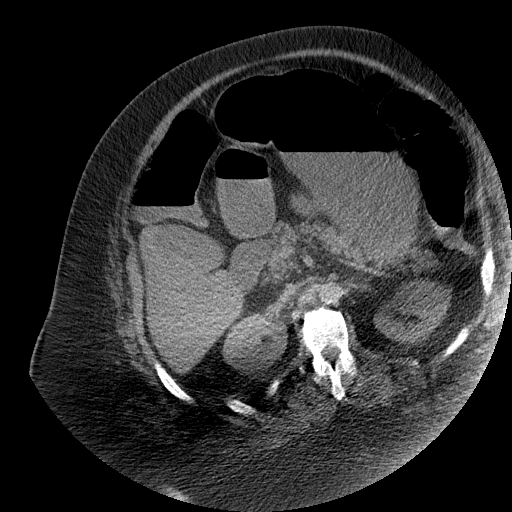
[im 77/111  bone]
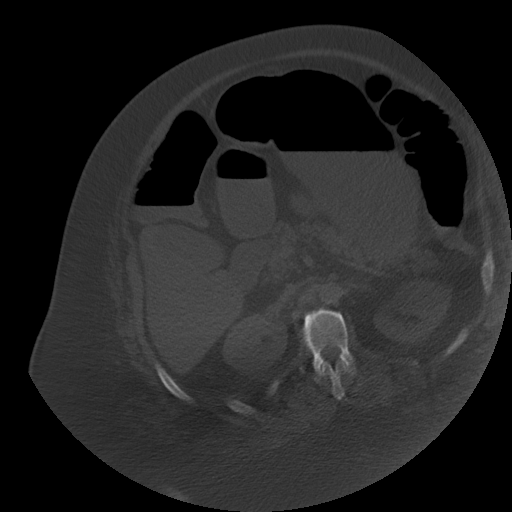
[im 85/111  soft-tissue]
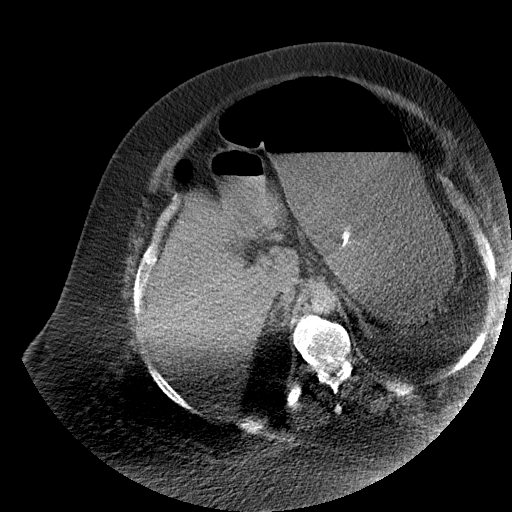
[im 94/111  soft-tissue]
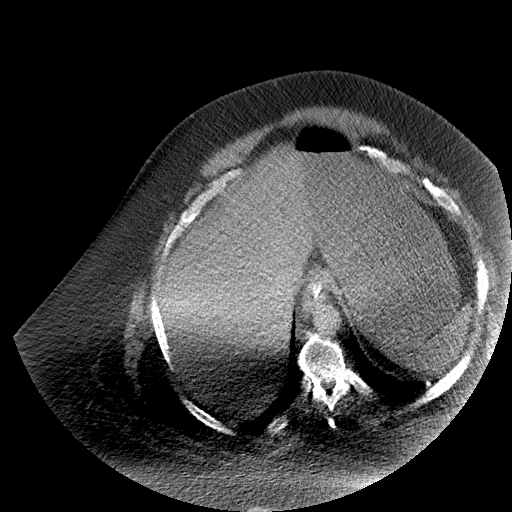
[im 102/111  soft-tissue]
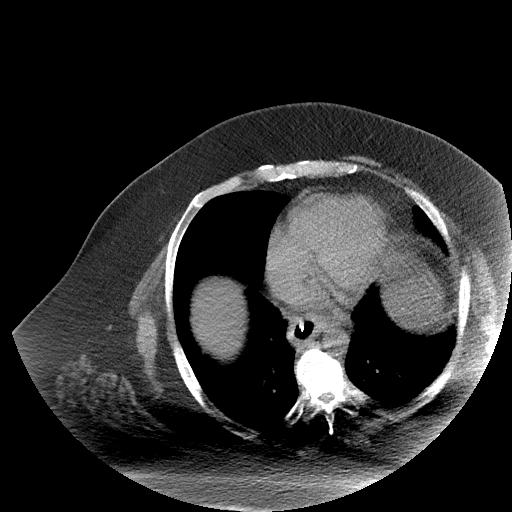

[Series 6: cor st · coronal · 1.07mm/px · 3 of 165 slices shown]
[im 55/165  soft-tissue]
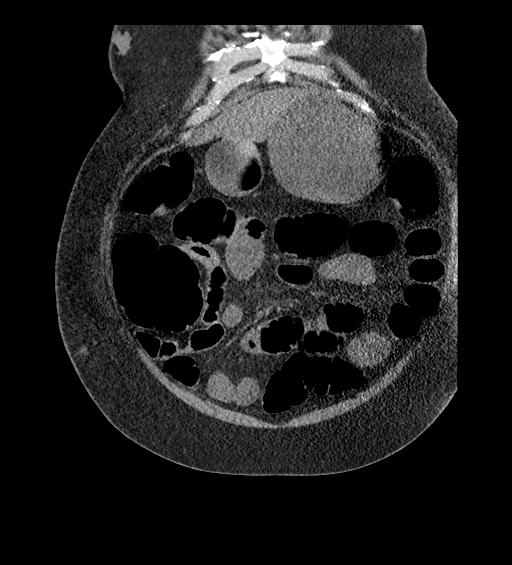
[im 73/165  soft-tissue]
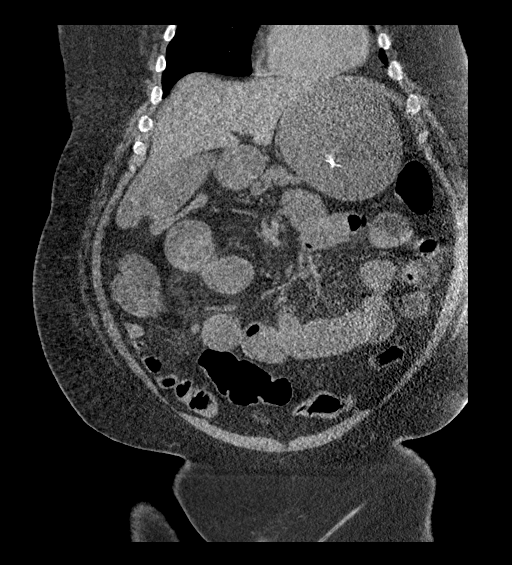
[im 92/165  soft-tissue]
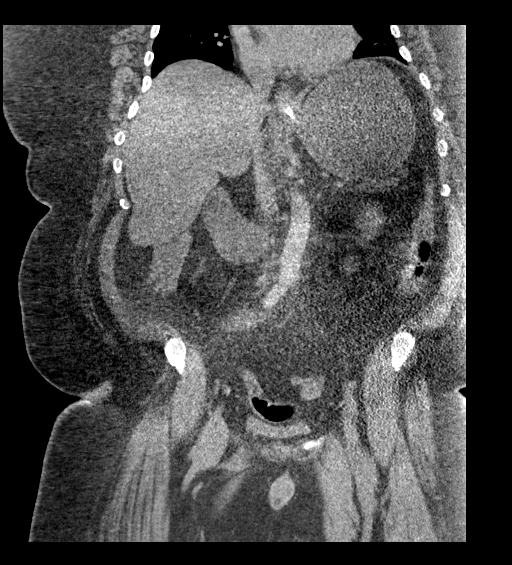

[15 of 46 positions shown; findings below may reference images not displayed]

FINDINGS: Lower chest: Solid 1.3 cm right lower lobe pulmonary nodule (series
5/image 5), stable since 03/29/2019 chest CT. Circumferential mild
wall thickening in the lower thoracic esophagus.

Hepatobiliary: Normal liver size. No liver mass. Normal gallbladder
with no radiopaque cholelithiasis. No biliary ductal dilatation.

Pancreas: Normal, with no mass or duct dilation.

Spleen: Normal size. No mass.

Adrenals/Urinary Tract: Normal adrenals. No renal stones. No
hydronephrosis. No contour deforming renal masses. Bladder collapsed
by indwelling Foley catheter.

Stomach/Bowel: Stomach moderately distended with large air-fluid
level. No gastric wall thickening or pneumatosis. Enteric tube
terminates in the body of the stomach. There is moderate diffuse
dilatation of the proximal to mid small bowel up to 5.5 cm diameter.
Air-fluid levels are scattered throughout the dilated small bowel
loops. Distal and terminal ileum are collapsed. No discrete small
bowel caliber transition. No small bowel wall thickening or
pneumatosis. Normal appendix. Moderate gaseous distention of the
large bowel, most prominent in the right and transverse colon.
Scattered small air-fluid levels in the large bowel. No large bowel
wall thickening, diverticulosis or significant pericolonic fat
stranding.

Vascular/Lymphatic: Normal caliber abdominal aorta. No
pathologically enlarged lymph nodes in the abdomen or pelvis.

Reproductive: Normal size prostate with nonspecific internal
prostatic calcification.

Other: No pneumoperitoneum, ascites or focal fluid collection.

Musculoskeletal: No aggressive appearing focal osseous lesions.
Moderate thoracolumbar spondylosis.
IMPRESSION: 1. CT findings are most suggestive of a severe diffuse adynamic
ileus given widespread small and large bowel dilatation with
air-fluid levels. The possibility of a partial mechanical mid to
distal small bowel obstruction is not excluded given the relatively
collapsed distal small bowel. No free air. No bowel wall thickening
or pneumatosis.
2. Enteric tube terminates in the body of the stomach.
3. Circumferential mild wall thickening in the lower thoracic
esophagus, nonspecific, potentially due to reflux esophagitis, with
Barrett's esophagus or neoplasm not excluded. Upper endoscopic
correlation may be considered as clinically warranted.
4. Solid 1.3 cm right lower lobe pulmonary nodule, stable since
03/29/2019 chest CT. Please see the 03/29/2019 chest CT report for
commentary and follow-up recommendations.
5. Aortic Atherosclerosis ([id number]).

## 2021-03-20 IMAGING — US US RENAL
1 series · 14 of 19 positions shown · non-contrast
Comparison: October 21, 2019

CLINICAL DATA: Acute kidney injury

EXAM:
RENAL / URINARY TRACT ULTRASOUND COMPLETE

[Series 1: us renal · 14 of 19 slices shown]
[im 1/19]
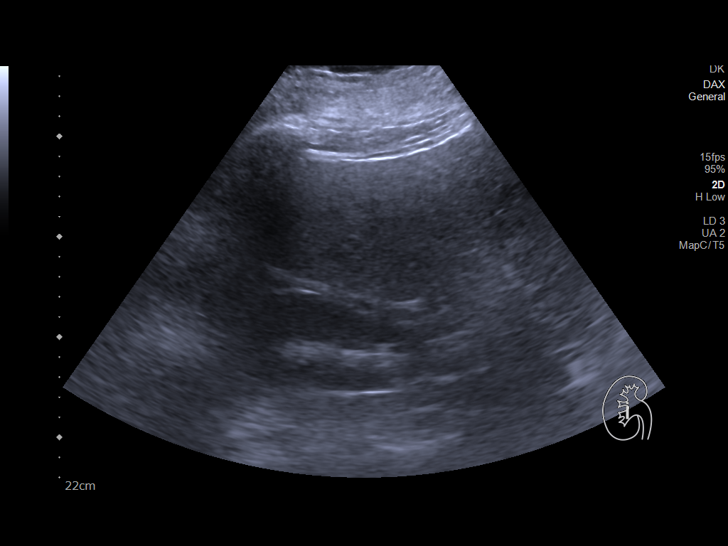
[im 3/19]
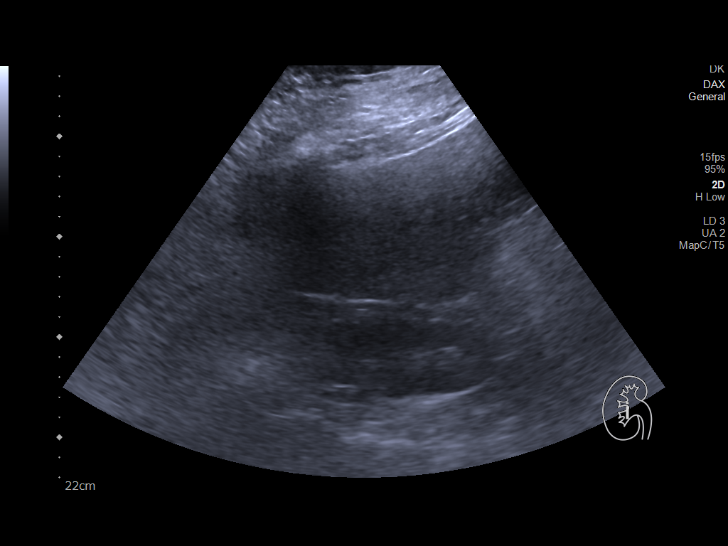
[im 4/19]
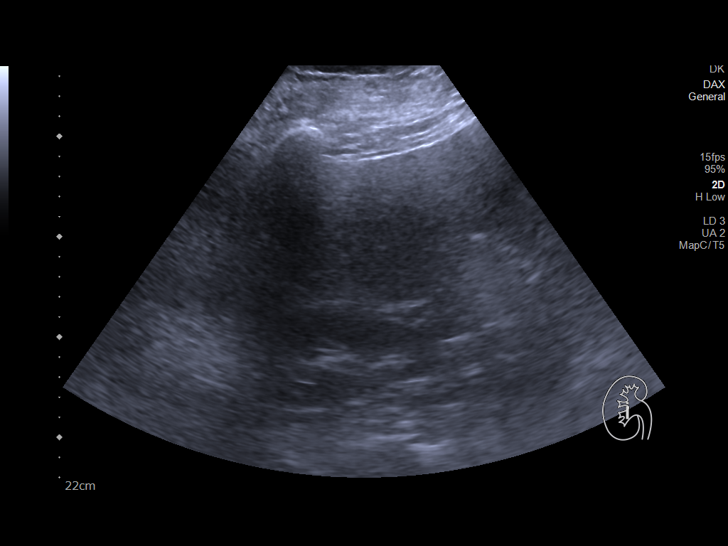
[im 5/19]
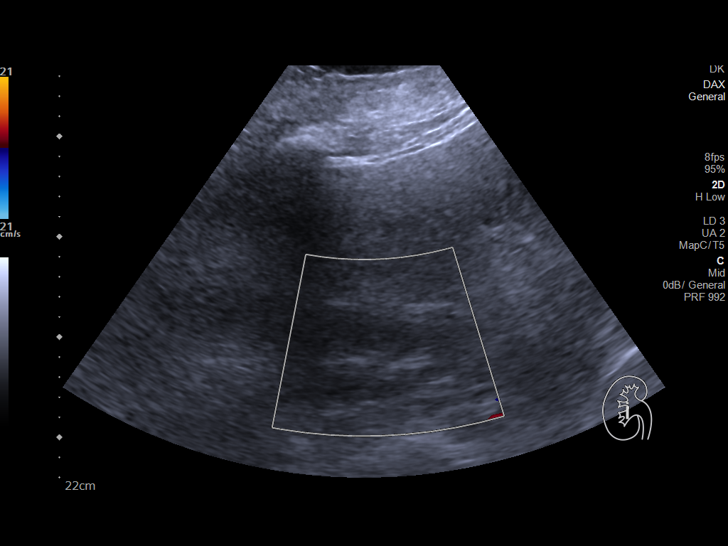
[im 7/19]
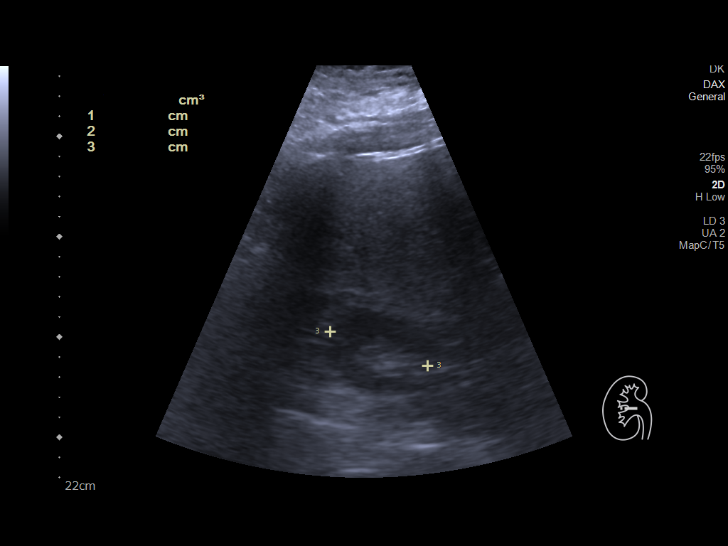
[im 8/19]
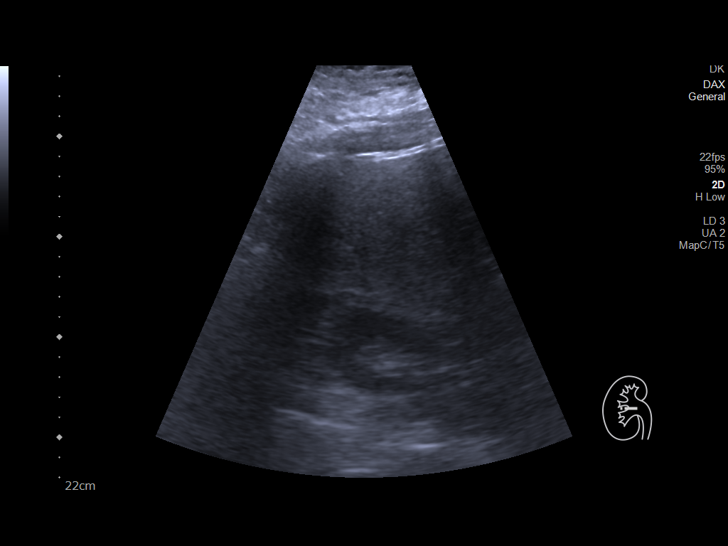
[im 9/19]
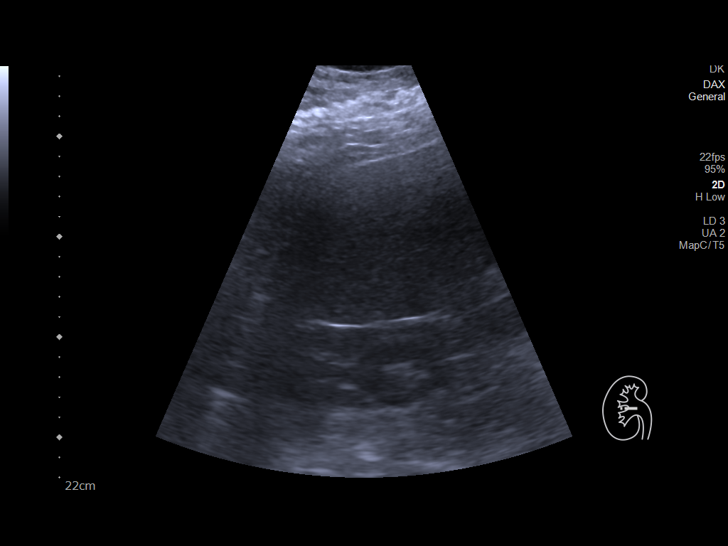
[im 11/19]
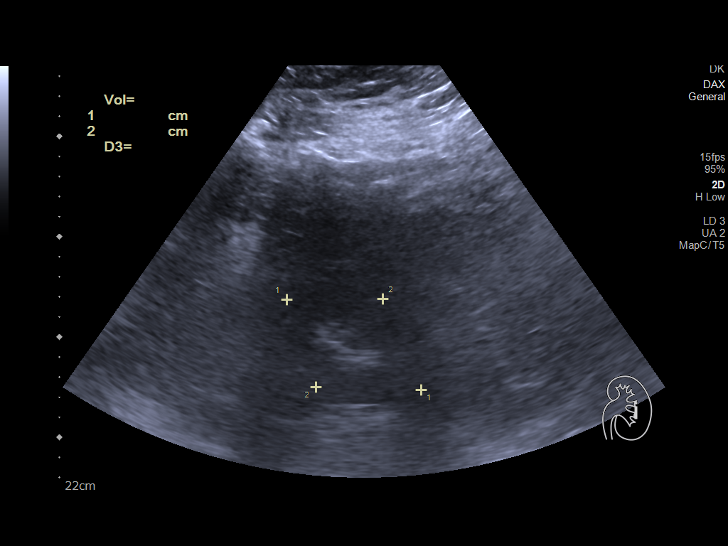
[im 12/19]
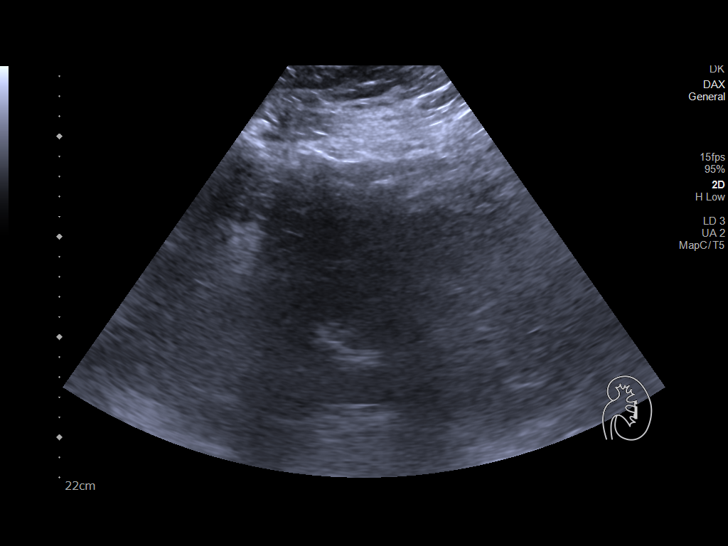
[im 13/19]
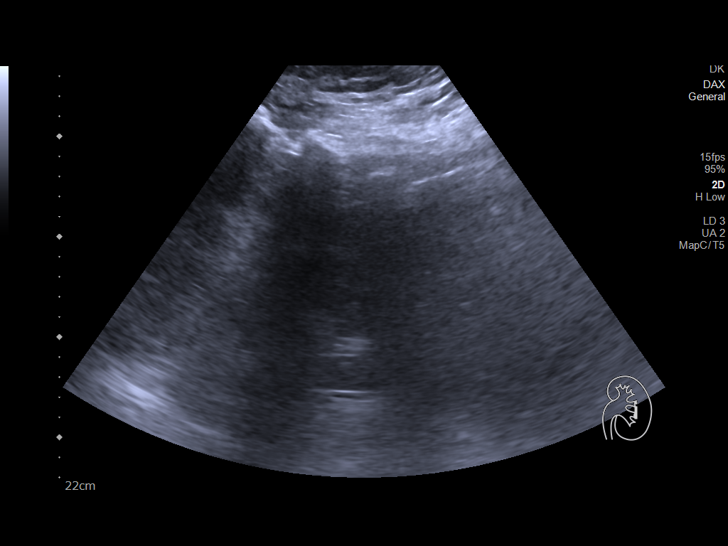
[im 15/19]
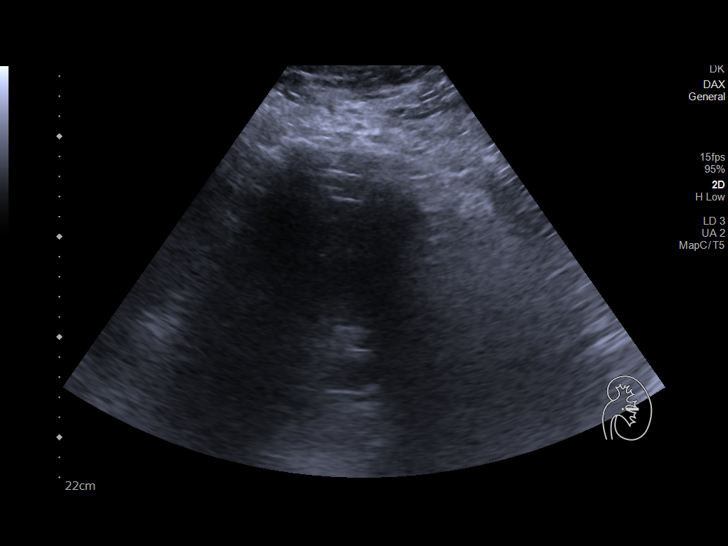
[im 16/19]
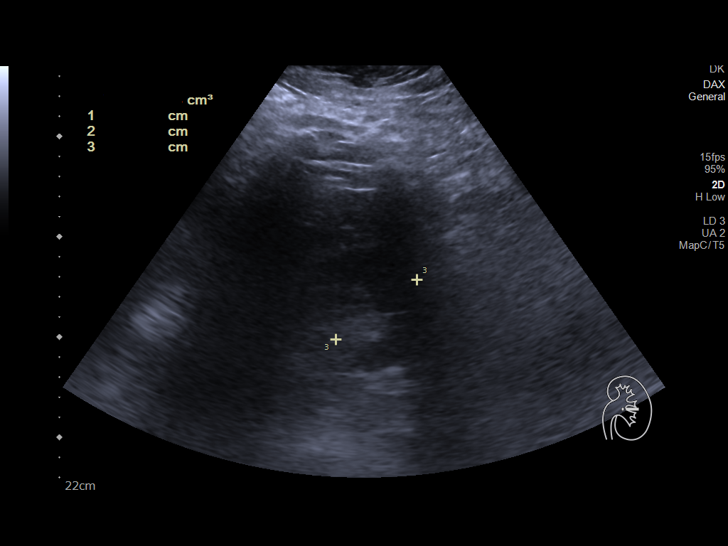
[im 17/19]
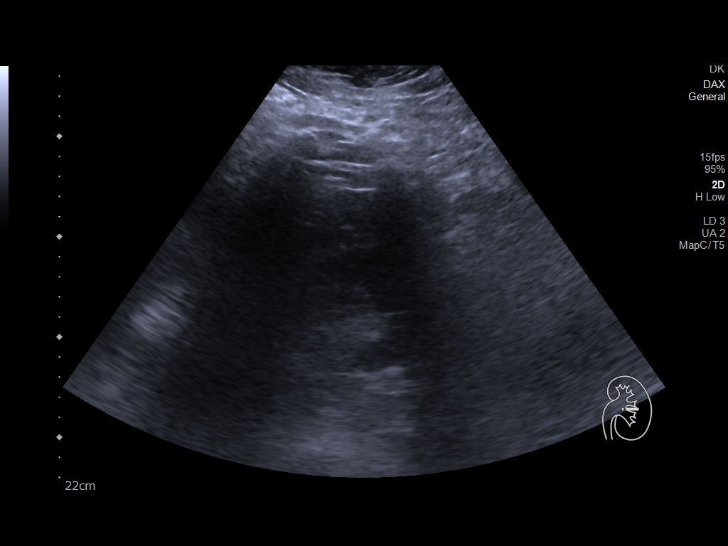
[im 19/19]
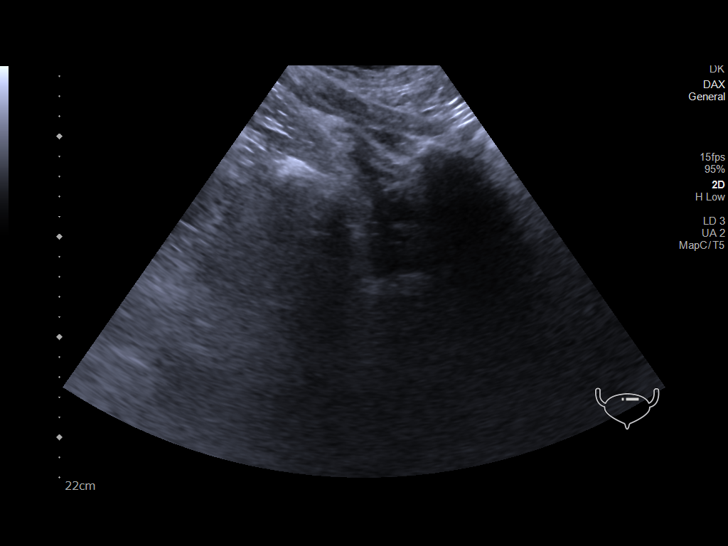

[14 of 19 positions shown; findings below may reference images not displayed]

FINDINGS: Right Kidney:

Renal measurements: 9.2 x 3.9 x 5.1 cm = volume: 95 mL .
Echogenicity within normal limits. No mass or hydronephrosis
visualized.

Left Kidney:

Renal measurements: 8.1 x 5.5 x 5 = volume: 116 mL. Echogenicity
within normal limits. No mass or hydronephrosis visualized.

Bladder:

The bladder is decompressed with a Foley catheter which limits
evaluation.

Other:

Overall evaluation of the kidneys was limited by patient body
habitus.
IMPRESSION: 1. Habitus limited study.
2. No definite acute abnormality given the above limitation. No
evidence for hydronephrosis.
3. Bladder decompressed with a Foley catheter which limits
evaluation.

## 2021-07-25 ENCOUNTER — Other Ambulatory Visit (HOSPITAL_BASED_OUTPATIENT_CLINIC_OR_DEPARTMENT_OTHER): Payer: Self-pay

## 2021-07-25 DIAGNOSIS — G4733 Obstructive sleep apnea (adult) (pediatric): Secondary | ICD-10-CM

## 2021-09-10 ENCOUNTER — Ambulatory Visit: Payer: Medicare Other | Attending: Sports Medicine | Admitting: Neurology

## 2021-09-10 ENCOUNTER — Other Ambulatory Visit: Payer: Self-pay

## 2021-09-10 DIAGNOSIS — I493 Ventricular premature depolarization: Secondary | ICD-10-CM | POA: Insufficient documentation

## 2021-09-10 DIAGNOSIS — G4733 Obstructive sleep apnea (adult) (pediatric): Secondary | ICD-10-CM | POA: Diagnosis present

## 2021-09-20 NOTE — Procedures (Signed)
?[place] ?Blayne Frankie A. Merlene Laughter, MD     [email or web address] ?       ? ?    NOCTURNAL POLYSOMNOGRAPHY  ? ?LOCATION: [facility] ? ?Patient Name: Douglas [name], Douglas [name] ?Study Date: 09/10/2021 ?Gender: Male ?D.O.B: 05/13/1971 ?Age (years): 42 ?Referring Provider: Leonie Douglas ?Height (inches): 72 ?Interpreting Physician: Phillips Odor MD, ABSM ?Weight (lbs): 401 ?RPSGT: Rosebud Poles ?BMI: 54 ?MRN: [id number] ?Neck Size: 23.00 ?CLINICAL INFORMATION ?Sleep Study Type: NPSG ? ?  ? ?Indication for sleep study: OSA ? ?  ? ?Epworth Sleepiness Score: N/A ? ?  ? ?SLEEP STUDY TECHNIQUE ?As per the AASM Manual for the Scoring of Sleep and Associated Events v2.3 (April 2016) with a hypopnea requiring 4% desaturations. ? ?The channels recorded and monitored were frontal, central and occipital EEG, electrooculogram (EOG), submentalis EMG (chin), nasal and oral airflow, thoracic and abdominal wall motion, anterior tibialis EMG, snore microphone, electrocardiogram, and pulse oximetry. ? ?MEDICATIONS ?Medications self-administered by patient taken the night of the study : N/A ? ?Current Outpatient Medications:  ?  allopurinol (ZYLOPRIM) 300 MG tablet, Take 300 mg by mouth daily., Disp: , Rfl:  ?  atorvastatin (LIPITOR) 20 MG tablet, Take 1 tablet by mouth daily., Disp: , Rfl:  ?  diltiazem (CARDIZEM CD) 360 MG 24 hr capsule, Take 360 mg by mouth daily., Disp: , Rfl:  ?  ELIQUIS 5 MG TABS tablet, Take 5 mg by mouth 2 (two) times daily., Disp: , Rfl:  ?  fluticasone (FLONASE) 50 MCG/ACT nasal spray, Place 1 spray into both nostrils daily as needed., Disp: , Rfl:  ?  Fluticasone-Salmeterol (ADVAIR) 250-50 MCG/DOSE AEPB, Inhale 1 puff into the lungs 2 (two) times daily as needed for shortness of breath or wheezing., Disp: , Rfl:  ?  furosemide (LASIX) 40 MG tablet, Take 40 mg by mouth daily., Disp: , Rfl:  ?  HM FEXOFENADINE HCL 180 MG tablet, Take 180 mg by mouth daily as needed., Disp: , Rfl:  ?  hydrALAZINE (APRESOLINE) 50  MG tablet, Take 1 tablet (50 mg total) by mouth 2 (two) times daily., Disp:  , Rfl:  ?  losartan (COZAAR) 100 MG tablet, Take 1 tablet (100 mg total) by mouth daily., Disp:  , Rfl:  ?  metFORMIN (GLUCOPHAGE) 1000 MG tablet, Take 1,000 mg by mouth daily., Disp: , Rfl:  ?  metoprolol succinate (TOPROL-XL) 50 MG 24 hr tablet, Take 100 mg by mouth daily., Disp: , Rfl:  ?  montelukast (SINGULAIR) 10 MG tablet, Take 10 mg by mouth daily as needed., Disp: , Rfl:  ?  Omega-3 1000 MG CAPS, Take 1 capsule by mouth daily., Disp: , Rfl:  ?  omeprazole (PRILOSEC) 20 MG capsule, Take 20 mg by mouth daily., Disp: , Rfl:  ?  sertraline (ZOLOFT) 50 MG tablet, Take 50 mg by mouth daily., Disp: , Rfl:  ?  traMADol (ULTRAM) 50 MG tablet, Take 1 tablet (50 mg total) by mouth daily as needed., Disp: 30 tablet, Rfl: 0 ? ?  ? ?SLEEP ARCHITECTURE ?The study was initiated at 9:38:24 PM and ended at 4:17:16 AM. ? ?Sleep onset time was 7.3 minutes and the sleep efficiency was 93.0%. The total sleep time was 371.1 minutes. ? ?Stage REM latency was 113.0 minutes. ? ?The patient spent 7.81% of the night in stage N1 sleep, 61.47% in stage N2 sleep, 21.15% in stage N3 and 9.6% in REM. ? ?Alpha intrusion was absent. ? ?Supine sleep was 100.00%. ? ?RESPIRATORY PARAMETERS ?The overall apnea/hypopnea index (AHI)  was 16.3 per hour. There were 1 total apneas, including 1 obstructive, 0 central and 0 mixed apneas. There were 100 hypopneas and 3 RERAs. ? ?The AHI during Stage REM sleep was 52.4 per hour. ? ?AHI while supine was 16.3 per hour. ? ?The mean oxygen saturation was 93.86%. The minimum SpO2 during sleep was 81.00%. ? ?loud snoring was noted during this study. ? ?CARDIAC DATA ?The 2 lead EKG demonstrated sinus rhythm. The mean heart rate was 69.49 beats per minute. Other EKG findings include: PVCs. ? ?LEG MOVEMENT DATA ?The total PLMS were 0 with a resulting PLMS index of 0.00. Associated arousal with leg movement index was  0.0. ? ?IMPRESSIONS ?Moderate obstructive sleep apnea is noted. AutoPap 10-20 is recommended.  ? ? ?Delano Metz, MD ?Diplomate, American Board of Sleep Medicine. ? ?ELECTRONICALLY SIGNED ON:  09/20/2021, 4:26 PM ?[place] ?PH: [phone]   FX: [phone] ?ACCREDITED BY THE AMERICAN ACADEMY OF SLEEP MEDICINE ? ?

## 2022-06-24 DIAGNOSIS — I480 Paroxysmal atrial fibrillation: Secondary | ICD-10-CM | POA: Diagnosis not present

## 2022-06-24 DIAGNOSIS — E782 Mixed hyperlipidemia: Secondary | ICD-10-CM | POA: Diagnosis not present

## 2022-06-24 DIAGNOSIS — G4733 Obstructive sleep apnea (adult) (pediatric): Secondary | ICD-10-CM | POA: Diagnosis not present

## 2022-06-24 DIAGNOSIS — I10 Essential (primary) hypertension: Secondary | ICD-10-CM | POA: Diagnosis not present

## 2022-06-24 DIAGNOSIS — I5022 Chronic systolic (congestive) heart failure: Secondary | ICD-10-CM | POA: Diagnosis not present

## 2022-06-26 DIAGNOSIS — I5022 Chronic systolic (congestive) heart failure: Secondary | ICD-10-CM | POA: Diagnosis not present

## 2022-06-26 DIAGNOSIS — J309 Allergic rhinitis, unspecified: Secondary | ICD-10-CM | POA: Diagnosis not present

## 2022-06-26 DIAGNOSIS — E1143 Type 2 diabetes mellitus with diabetic autonomic (poly)neuropathy: Secondary | ICD-10-CM | POA: Diagnosis not present

## 2022-06-26 DIAGNOSIS — J4520 Mild intermittent asthma, uncomplicated: Secondary | ICD-10-CM | POA: Diagnosis not present

## 2022-06-26 DIAGNOSIS — N182 Chronic kidney disease, stage 2 (mild): Secondary | ICD-10-CM | POA: Diagnosis not present

## 2022-06-26 DIAGNOSIS — E0843 Diabetes mellitus due to underlying condition with diabetic autonomic (poly)neuropathy: Secondary | ICD-10-CM | POA: Diagnosis not present

## 2022-06-26 DIAGNOSIS — I10 Essential (primary) hypertension: Secondary | ICD-10-CM | POA: Diagnosis not present

## 2022-06-26 DIAGNOSIS — G4730 Sleep apnea, unspecified: Secondary | ICD-10-CM | POA: Diagnosis not present

## 2022-06-26 DIAGNOSIS — I4819 Other persistent atrial fibrillation: Secondary | ICD-10-CM | POA: Diagnosis not present

## 2022-07-23 DIAGNOSIS — I10 Essential (primary) hypertension: Secondary | ICD-10-CM | POA: Diagnosis not present

## 2022-07-23 DIAGNOSIS — I110 Hypertensive heart disease with heart failure: Secondary | ICD-10-CM | POA: Diagnosis not present

## 2022-07-23 DIAGNOSIS — I5022 Chronic systolic (congestive) heart failure: Secondary | ICD-10-CM | POA: Diagnosis not present

## 2022-07-23 DIAGNOSIS — G4733 Obstructive sleep apnea (adult) (pediatric): Secondary | ICD-10-CM | POA: Diagnosis not present

## 2022-07-23 DIAGNOSIS — M1990 Unspecified osteoarthritis, unspecified site: Secondary | ICD-10-CM | POA: Diagnosis not present

## 2022-07-23 DIAGNOSIS — Z7984 Long term (current) use of oral hypoglycemic drugs: Secondary | ICD-10-CM | POA: Diagnosis not present

## 2022-07-23 DIAGNOSIS — Z79899 Other long term (current) drug therapy: Secondary | ICD-10-CM | POA: Diagnosis not present

## 2022-07-23 DIAGNOSIS — M25569 Pain in unspecified knee: Secondary | ICD-10-CM | POA: Diagnosis not present

## 2022-07-23 DIAGNOSIS — I480 Paroxysmal atrial fibrillation: Secondary | ICD-10-CM | POA: Diagnosis not present

## 2022-07-23 DIAGNOSIS — Z713 Dietary counseling and surveillance: Secondary | ICD-10-CM | POA: Diagnosis not present

## 2022-07-23 DIAGNOSIS — E7800 Pure hypercholesterolemia, unspecified: Secondary | ICD-10-CM | POA: Diagnosis not present

## 2022-07-23 DIAGNOSIS — E119 Type 2 diabetes mellitus without complications: Secondary | ICD-10-CM | POA: Diagnosis not present

## 2022-07-25 DIAGNOSIS — G4733 Obstructive sleep apnea (adult) (pediatric): Secondary | ICD-10-CM | POA: Diagnosis not present

## 2022-08-23 DIAGNOSIS — G4733 Obstructive sleep apnea (adult) (pediatric): Secondary | ICD-10-CM | POA: Diagnosis not present

## 2022-09-23 DIAGNOSIS — G4733 Obstructive sleep apnea (adult) (pediatric): Secondary | ICD-10-CM | POA: Diagnosis not present

## 2022-09-24 DIAGNOSIS — Z0000 Encounter for general adult medical examination without abnormal findings: Secondary | ICD-10-CM | POA: Diagnosis not present

## 2022-09-28 DIAGNOSIS — L03211 Cellulitis of face: Secondary | ICD-10-CM | POA: Diagnosis not present

## 2022-09-28 DIAGNOSIS — I5022 Chronic systolic (congestive) heart failure: Secondary | ICD-10-CM | POA: Diagnosis not present

## 2022-09-28 DIAGNOSIS — I4891 Unspecified atrial fibrillation: Secondary | ICD-10-CM | POA: Diagnosis not present

## 2022-09-28 DIAGNOSIS — E119 Type 2 diabetes mellitus without complications: Secondary | ICD-10-CM | POA: Diagnosis not present

## 2022-09-28 DIAGNOSIS — Z87891 Personal history of nicotine dependence: Secondary | ICD-10-CM | POA: Diagnosis not present

## 2022-09-28 DIAGNOSIS — I110 Hypertensive heart disease with heart failure: Secondary | ICD-10-CM | POA: Diagnosis not present

## 2022-09-28 DIAGNOSIS — E7800 Pure hypercholesterolemia, unspecified: Secondary | ICD-10-CM | POA: Diagnosis not present

## 2022-10-14 DIAGNOSIS — E119 Type 2 diabetes mellitus without complications: Secondary | ICD-10-CM | POA: Diagnosis not present

## 2022-10-23 DIAGNOSIS — G4733 Obstructive sleep apnea (adult) (pediatric): Secondary | ICD-10-CM | POA: Diagnosis not present

## 2022-10-29 DIAGNOSIS — Z7952 Long term (current) use of systemic steroids: Secondary | ICD-10-CM | POA: Diagnosis not present

## 2022-10-29 DIAGNOSIS — M810 Age-related osteoporosis without current pathological fracture: Secondary | ICD-10-CM | POA: Diagnosis not present

## 2022-12-24 DIAGNOSIS — Z0000 Encounter for general adult medical examination without abnormal findings: Secondary | ICD-10-CM | POA: Diagnosis not present

## 2022-12-24 DIAGNOSIS — I4819 Other persistent atrial fibrillation: Secondary | ICD-10-CM | POA: Diagnosis not present

## 2022-12-24 DIAGNOSIS — I5022 Chronic systolic (congestive) heart failure: Secondary | ICD-10-CM | POA: Diagnosis not present

## 2022-12-24 DIAGNOSIS — I10 Essential (primary) hypertension: Secondary | ICD-10-CM | POA: Diagnosis not present

## 2022-12-24 DIAGNOSIS — M5459 Other low back pain: Secondary | ICD-10-CM | POA: Diagnosis not present

## 2022-12-24 DIAGNOSIS — N182 Chronic kidney disease, stage 2 (mild): Secondary | ICD-10-CM | POA: Diagnosis not present

## 2022-12-24 DIAGNOSIS — E1143 Type 2 diabetes mellitus with diabetic autonomic (poly)neuropathy: Secondary | ICD-10-CM | POA: Diagnosis not present

## 2022-12-24 DIAGNOSIS — J4520 Mild intermittent asthma, uncomplicated: Secondary | ICD-10-CM | POA: Diagnosis not present

## 2023-01-09 DIAGNOSIS — E7800 Pure hypercholesterolemia, unspecified: Secondary | ICD-10-CM | POA: Diagnosis not present

## 2023-01-09 DIAGNOSIS — E119 Type 2 diabetes mellitus without complications: Secondary | ICD-10-CM | POA: Diagnosis not present

## 2023-01-09 DIAGNOSIS — Z5941 Food insecurity: Secondary | ICD-10-CM | POA: Diagnosis not present

## 2023-01-09 DIAGNOSIS — Z7901 Long term (current) use of anticoagulants: Secondary | ICD-10-CM | POA: Diagnosis not present

## 2023-01-09 DIAGNOSIS — Z7985 Long-term (current) use of injectable non-insulin antidiabetic drugs: Secondary | ICD-10-CM | POA: Diagnosis not present

## 2023-01-09 DIAGNOSIS — Z885 Allergy status to narcotic agent status: Secondary | ICD-10-CM | POA: Diagnosis not present

## 2023-01-09 DIAGNOSIS — Z7722 Contact with and (suspected) exposure to environmental tobacco smoke (acute) (chronic): Secondary | ICD-10-CM | POA: Diagnosis not present

## 2023-01-09 DIAGNOSIS — Z7984 Long term (current) use of oral hypoglycemic drugs: Secondary | ICD-10-CM | POA: Diagnosis not present

## 2023-01-09 DIAGNOSIS — Z87891 Personal history of nicotine dependence: Secondary | ICD-10-CM | POA: Diagnosis not present

## 2023-01-09 DIAGNOSIS — Z79899 Other long term (current) drug therapy: Secondary | ICD-10-CM | POA: Diagnosis not present

## 2023-01-09 DIAGNOSIS — U071 COVID-19: Secondary | ICD-10-CM | POA: Diagnosis not present

## 2023-01-09 DIAGNOSIS — Z7902 Long term (current) use of antithrombotics/antiplatelets: Secondary | ICD-10-CM | POA: Diagnosis not present

## 2023-01-09 DIAGNOSIS — I110 Hypertensive heart disease with heart failure: Secondary | ICD-10-CM | POA: Diagnosis not present

## 2023-01-09 DIAGNOSIS — I5022 Chronic systolic (congestive) heart failure: Secondary | ICD-10-CM | POA: Diagnosis not present

## 2023-01-09 DIAGNOSIS — R519 Headache, unspecified: Secondary | ICD-10-CM | POA: Diagnosis not present

## 2023-03-25 DIAGNOSIS — J4520 Mild intermittent asthma, uncomplicated: Secondary | ICD-10-CM | POA: Diagnosis not present

## 2023-03-25 DIAGNOSIS — E1143 Type 2 diabetes mellitus with diabetic autonomic (poly)neuropathy: Secondary | ICD-10-CM | POA: Diagnosis not present

## 2023-03-25 DIAGNOSIS — I5022 Chronic systolic (congestive) heart failure: Secondary | ICD-10-CM | POA: Diagnosis not present

## 2023-03-25 DIAGNOSIS — N182 Chronic kidney disease, stage 2 (mild): Secondary | ICD-10-CM | POA: Diagnosis not present

## 2023-03-25 DIAGNOSIS — I4819 Other persistent atrial fibrillation: Secondary | ICD-10-CM | POA: Diagnosis not present

## 2023-03-25 DIAGNOSIS — M5459 Other low back pain: Secondary | ICD-10-CM | POA: Diagnosis not present

## 2023-03-25 DIAGNOSIS — I10 Essential (primary) hypertension: Secondary | ICD-10-CM | POA: Diagnosis not present

## 2023-04-03 DIAGNOSIS — R1030 Lower abdominal pain, unspecified: Secondary | ICD-10-CM | POA: Diagnosis not present

## 2023-04-03 DIAGNOSIS — K8020 Calculus of gallbladder without cholecystitis without obstruction: Secondary | ICD-10-CM | POA: Diagnosis not present

## 2023-04-03 DIAGNOSIS — I5022 Chronic systolic (congestive) heart failure: Secondary | ICD-10-CM | POA: Diagnosis not present

## 2023-04-03 DIAGNOSIS — I4891 Unspecified atrial fibrillation: Secondary | ICD-10-CM | POA: Diagnosis not present

## 2023-04-03 DIAGNOSIS — E7800 Pure hypercholesterolemia, unspecified: Secondary | ICD-10-CM | POA: Diagnosis not present

## 2023-04-03 DIAGNOSIS — Z87891 Personal history of nicotine dependence: Secondary | ICD-10-CM | POA: Diagnosis not present

## 2023-04-03 DIAGNOSIS — Z5941 Food insecurity: Secondary | ICD-10-CM | POA: Diagnosis not present

## 2023-04-03 DIAGNOSIS — R319 Hematuria, unspecified: Secondary | ICD-10-CM | POA: Diagnosis not present

## 2023-04-03 DIAGNOSIS — I110 Hypertensive heart disease with heart failure: Secondary | ICD-10-CM | POA: Diagnosis not present

## 2023-04-03 DIAGNOSIS — N3001 Acute cystitis with hematuria: Secondary | ICD-10-CM | POA: Diagnosis not present

## 2023-04-03 DIAGNOSIS — R109 Unspecified abdominal pain: Secondary | ICD-10-CM | POA: Diagnosis not present

## 2023-04-03 DIAGNOSIS — E119 Type 2 diabetes mellitus without complications: Secondary | ICD-10-CM | POA: Diagnosis not present

## 2023-04-30 DIAGNOSIS — E785 Hyperlipidemia, unspecified: Secondary | ICD-10-CM | POA: Diagnosis not present

## 2023-04-30 DIAGNOSIS — K219 Gastro-esophageal reflux disease without esophagitis: Secondary | ICD-10-CM | POA: Diagnosis not present

## 2023-04-30 DIAGNOSIS — Z79899 Other long term (current) drug therapy: Secondary | ICD-10-CM | POA: Diagnosis not present

## 2023-04-30 DIAGNOSIS — I110 Hypertensive heart disease with heart failure: Secondary | ICD-10-CM | POA: Diagnosis not present

## 2023-04-30 DIAGNOSIS — Z7984 Long term (current) use of oral hypoglycemic drugs: Secondary | ICD-10-CM | POA: Diagnosis not present

## 2023-04-30 DIAGNOSIS — Z01818 Encounter for other preprocedural examination: Secondary | ICD-10-CM | POA: Diagnosis not present

## 2023-04-30 DIAGNOSIS — E7800 Pure hypercholesterolemia, unspecified: Secondary | ICD-10-CM | POA: Diagnosis not present

## 2023-04-30 DIAGNOSIS — G4733 Obstructive sleep apnea (adult) (pediatric): Secondary | ICD-10-CM | POA: Diagnosis not present

## 2023-04-30 DIAGNOSIS — Z8719 Personal history of other diseases of the digestive system: Secondary | ICD-10-CM | POA: Diagnosis not present

## 2023-04-30 DIAGNOSIS — K449 Diaphragmatic hernia without obstruction or gangrene: Secondary | ICD-10-CM | POA: Diagnosis not present

## 2023-04-30 DIAGNOSIS — J449 Chronic obstructive pulmonary disease, unspecified: Secondary | ICD-10-CM | POA: Diagnosis not present

## 2023-04-30 DIAGNOSIS — I480 Paroxysmal atrial fibrillation: Secondary | ICD-10-CM | POA: Diagnosis not present

## 2023-04-30 DIAGNOSIS — E119 Type 2 diabetes mellitus without complications: Secondary | ICD-10-CM | POA: Diagnosis not present

## 2023-04-30 DIAGNOSIS — I5022 Chronic systolic (congestive) heart failure: Secondary | ICD-10-CM | POA: Diagnosis not present

## 2023-04-30 DIAGNOSIS — M1990 Unspecified osteoarthritis, unspecified site: Secondary | ICD-10-CM | POA: Diagnosis not present

## 2023-07-01 DIAGNOSIS — I4819 Other persistent atrial fibrillation: Secondary | ICD-10-CM | POA: Diagnosis not present

## 2023-07-01 DIAGNOSIS — M5459 Other low back pain: Secondary | ICD-10-CM | POA: Diagnosis not present

## 2023-07-01 DIAGNOSIS — N182 Chronic kidney disease, stage 2 (mild): Secondary | ICD-10-CM | POA: Diagnosis not present

## 2023-07-01 DIAGNOSIS — E1122 Type 2 diabetes mellitus with diabetic chronic kidney disease: Secondary | ICD-10-CM | POA: Diagnosis not present

## 2023-07-01 DIAGNOSIS — I5022 Chronic systolic (congestive) heart failure: Secondary | ICD-10-CM | POA: Diagnosis not present

## 2023-07-01 DIAGNOSIS — I10 Essential (primary) hypertension: Secondary | ICD-10-CM | POA: Diagnosis not present

## 2023-07-01 DIAGNOSIS — J4520 Mild intermittent asthma, uncomplicated: Secondary | ICD-10-CM | POA: Diagnosis not present

## 2023-07-28 DIAGNOSIS — I4891 Unspecified atrial fibrillation: Secondary | ICD-10-CM | POA: Diagnosis not present

## 2023-07-28 DIAGNOSIS — R059 Cough, unspecified: Secondary | ICD-10-CM | POA: Diagnosis not present

## 2023-07-28 DIAGNOSIS — I110 Hypertensive heart disease with heart failure: Secondary | ICD-10-CM | POA: Diagnosis not present

## 2023-07-28 DIAGNOSIS — Z7901 Long term (current) use of anticoagulants: Secondary | ICD-10-CM | POA: Diagnosis not present

## 2023-07-28 DIAGNOSIS — R911 Solitary pulmonary nodule: Secondary | ICD-10-CM | POA: Diagnosis not present

## 2023-07-28 DIAGNOSIS — J209 Acute bronchitis, unspecified: Secondary | ICD-10-CM | POA: Diagnosis not present

## 2023-07-28 DIAGNOSIS — Z20822 Contact with and (suspected) exposure to covid-19: Secondary | ICD-10-CM | POA: Diagnosis not present

## 2023-07-28 DIAGNOSIS — R079 Chest pain, unspecified: Secondary | ICD-10-CM | POA: Diagnosis not present

## 2023-07-28 DIAGNOSIS — I509 Heart failure, unspecified: Secondary | ICD-10-CM | POA: Diagnosis not present

## 2023-07-28 DIAGNOSIS — E119 Type 2 diabetes mellitus without complications: Secondary | ICD-10-CM | POA: Diagnosis not present

## 2023-07-28 DIAGNOSIS — E7800 Pure hypercholesterolemia, unspecified: Secondary | ICD-10-CM | POA: Diagnosis not present

## 2023-07-28 DIAGNOSIS — Z87891 Personal history of nicotine dependence: Secondary | ICD-10-CM | POA: Diagnosis not present

## 2023-07-29 DIAGNOSIS — I509 Heart failure, unspecified: Secondary | ICD-10-CM | POA: Diagnosis not present

## 2023-07-29 DIAGNOSIS — J209 Acute bronchitis, unspecified: Secondary | ICD-10-CM | POA: Diagnosis not present

## 2023-07-29 DIAGNOSIS — R002 Palpitations: Secondary | ICD-10-CM | POA: Diagnosis not present

## 2023-07-29 DIAGNOSIS — E119 Type 2 diabetes mellitus without complications: Secondary | ICD-10-CM | POA: Diagnosis not present

## 2023-07-29 DIAGNOSIS — I499 Cardiac arrhythmia, unspecified: Secondary | ICD-10-CM | POA: Diagnosis not present

## 2023-07-29 DIAGNOSIS — I110 Hypertensive heart disease with heart failure: Secondary | ICD-10-CM | POA: Diagnosis not present

## 2023-07-29 DIAGNOSIS — R000 Tachycardia, unspecified: Secondary | ICD-10-CM | POA: Diagnosis not present

## 2023-07-29 DIAGNOSIS — Z87891 Personal history of nicotine dependence: Secondary | ICD-10-CM | POA: Diagnosis not present

## 2023-07-29 DIAGNOSIS — I10 Essential (primary) hypertension: Secondary | ICD-10-CM | POA: Diagnosis not present

## 2023-07-29 DIAGNOSIS — Z743 Need for continuous supervision: Secondary | ICD-10-CM | POA: Diagnosis not present

## 2023-07-29 DIAGNOSIS — I471 Supraventricular tachycardia, unspecified: Secondary | ICD-10-CM | POA: Diagnosis not present

## 2023-07-29 DIAGNOSIS — E7800 Pure hypercholesterolemia, unspecified: Secondary | ICD-10-CM | POA: Diagnosis not present

## 2023-07-29 DIAGNOSIS — R0989 Other specified symptoms and signs involving the circulatory and respiratory systems: Secondary | ICD-10-CM | POA: Diagnosis not present

## 2023-07-29 DIAGNOSIS — I4891 Unspecified atrial fibrillation: Secondary | ICD-10-CM | POA: Diagnosis not present

## 2023-07-29 DIAGNOSIS — R0902 Hypoxemia: Secondary | ICD-10-CM | POA: Diagnosis not present

## 2023-07-29 DIAGNOSIS — Z8679 Personal history of other diseases of the circulatory system: Secondary | ICD-10-CM | POA: Diagnosis not present

## 2023-09-30 DIAGNOSIS — M5459 Other low back pain: Secondary | ICD-10-CM | POA: Diagnosis not present

## 2023-09-30 DIAGNOSIS — E1122 Type 2 diabetes mellitus with diabetic chronic kidney disease: Secondary | ICD-10-CM | POA: Diagnosis not present

## 2023-09-30 DIAGNOSIS — I4819 Other persistent atrial fibrillation: Secondary | ICD-10-CM | POA: Diagnosis not present

## 2023-09-30 DIAGNOSIS — G4730 Sleep apnea, unspecified: Secondary | ICD-10-CM | POA: Diagnosis not present

## 2023-09-30 DIAGNOSIS — I10 Essential (primary) hypertension: Secondary | ICD-10-CM | POA: Diagnosis not present

## 2023-09-30 DIAGNOSIS — Z0000 Encounter for general adult medical examination without abnormal findings: Secondary | ICD-10-CM | POA: Diagnosis not present

## 2023-09-30 DIAGNOSIS — I5022 Chronic systolic (congestive) heart failure: Secondary | ICD-10-CM | POA: Diagnosis not present

## 2023-09-30 DIAGNOSIS — J309 Allergic rhinitis, unspecified: Secondary | ICD-10-CM | POA: Diagnosis not present

## 2023-09-30 DIAGNOSIS — J4520 Mild intermittent asthma, uncomplicated: Secondary | ICD-10-CM | POA: Diagnosis not present

## 2023-09-30 DIAGNOSIS — N182 Chronic kidney disease, stage 2 (mild): Secondary | ICD-10-CM | POA: Diagnosis not present

## 2024-01-11 DIAGNOSIS — I5022 Chronic systolic (congestive) heart failure: Secondary | ICD-10-CM | POA: Diagnosis not present

## 2024-01-11 DIAGNOSIS — J4520 Mild intermittent asthma, uncomplicated: Secondary | ICD-10-CM | POA: Diagnosis not present

## 2024-01-11 DIAGNOSIS — E1122 Type 2 diabetes mellitus with diabetic chronic kidney disease: Secondary | ICD-10-CM | POA: Diagnosis not present

## 2024-01-11 DIAGNOSIS — Z0000 Encounter for general adult medical examination without abnormal findings: Secondary | ICD-10-CM | POA: Diagnosis not present

## 2024-01-11 DIAGNOSIS — N182 Chronic kidney disease, stage 2 (mild): Secondary | ICD-10-CM | POA: Diagnosis not present

## 2024-01-11 DIAGNOSIS — M5459 Other low back pain: Secondary | ICD-10-CM | POA: Diagnosis not present

## 2024-01-11 DIAGNOSIS — I10 Essential (primary) hypertension: Secondary | ICD-10-CM | POA: Diagnosis not present

## 2024-01-11 DIAGNOSIS — G4730 Sleep apnea, unspecified: Secondary | ICD-10-CM | POA: Diagnosis not present

## 2024-01-11 DIAGNOSIS — I4819 Other persistent atrial fibrillation: Secondary | ICD-10-CM | POA: Diagnosis not present

## 2024-01-11 DIAGNOSIS — J309 Allergic rhinitis, unspecified: Secondary | ICD-10-CM | POA: Diagnosis not present

## 2024-04-19 ENCOUNTER — Other Ambulatory Visit: Payer: Self-pay | Admitting: Cardiology
